# Patient Record
Sex: Female | Born: 1945 | Race: White | Hispanic: No | Smoking: Never smoker
Health system: Southern US, Community
[De-identification: ages and names within clinical notes are randomized; demographics above are authoritative.]

## PROBLEM LIST (undated history)

## (undated) DIAGNOSIS — I480 Paroxysmal atrial fibrillation: Secondary | ICD-10-CM

## (undated) DIAGNOSIS — D649 Anemia, unspecified: Secondary | ICD-10-CM

## (undated) DIAGNOSIS — Z9889 Other specified postprocedural states: Secondary | ICD-10-CM

## (undated) DIAGNOSIS — N189 Chronic kidney disease, unspecified: Secondary | ICD-10-CM

## (undated) DIAGNOSIS — T8859XA Other complications of anesthesia, initial encounter: Secondary | ICD-10-CM

## (undated) DIAGNOSIS — M1990 Unspecified osteoarthritis, unspecified site: Secondary | ICD-10-CM

## (undated) DIAGNOSIS — E119 Type 2 diabetes mellitus without complications: Secondary | ICD-10-CM

## (undated) DIAGNOSIS — I10 Essential (primary) hypertension: Secondary | ICD-10-CM

## (undated) DIAGNOSIS — R112 Nausea with vomiting, unspecified: Secondary | ICD-10-CM

## (undated) DIAGNOSIS — F419 Anxiety disorder, unspecified: Secondary | ICD-10-CM

## (undated) DIAGNOSIS — R001 Bradycardia, unspecified: Principal | ICD-10-CM

## (undated) DIAGNOSIS — Z9289 Personal history of other medical treatment: Secondary | ICD-10-CM

## (undated) DIAGNOSIS — Z87442 Personal history of urinary calculi: Secondary | ICD-10-CM

## (undated) DIAGNOSIS — T4145XA Adverse effect of unspecified anesthetic, initial encounter: Secondary | ICD-10-CM

## (undated) HISTORY — DX: Essential (primary) hypertension: I10

## (undated) HISTORY — DX: Type 2 diabetes mellitus without complications: E11.9

## (undated) HISTORY — DX: Paroxysmal atrial fibrillation: I48.0

## (undated) HISTORY — DX: Personal history of other medical treatment: Z92.89

## (undated) HISTORY — DX: Bradycardia, unspecified: R00.1

## (undated) HISTORY — PX: CARPAL TUNNEL RELEASE: SHX101

---
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
## 1990-07-06 HISTORY — PX: BREAST LUMPECTOMY: SHX2

## 1998-09-18 ENCOUNTER — Other Ambulatory Visit: Admission: RE | Admit: 1998-09-18 | Discharge: 1998-09-18 | Payer: Self-pay | Admitting: *Deleted

## 2000-02-26 ENCOUNTER — Other Ambulatory Visit: Admission: RE | Admit: 2000-02-26 | Discharge: 2000-02-26 | Payer: Self-pay | Admitting: *Deleted

## 2000-08-09 ENCOUNTER — Encounter: Payer: Self-pay | Admitting: Emergency Medicine

## 2000-08-09 ENCOUNTER — Emergency Department (HOSPITAL_COMMUNITY): Admission: EM | Admit: 2000-08-09 | Discharge: 2000-08-09 | Payer: Self-pay | Admitting: *Deleted

## 2000-10-25 ENCOUNTER — Encounter: Admission: RE | Admit: 2000-10-25 | Discharge: 2000-10-25 | Payer: Self-pay | Admitting: Family Medicine

## 2000-10-25 ENCOUNTER — Encounter: Payer: Self-pay | Admitting: Family Medicine

## 2004-03-01 ENCOUNTER — Encounter: Admission: RE | Admit: 2004-03-01 | Discharge: 2004-03-01 | Payer: Self-pay | Admitting: Orthopedic Surgery

## 2004-08-04 ENCOUNTER — Other Ambulatory Visit: Admission: RE | Admit: 2004-08-04 | Discharge: 2004-08-04 | Payer: Self-pay | Admitting: Family Medicine

## 2009-07-06 HISTORY — PX: KNEE ARTHROSCOPY: SHX127

## 2009-07-06 HISTORY — PX: TOTAL KNEE ARTHROPLASTY: SHX125

## 2012-10-04 DIAGNOSIS — Z9289 Personal history of other medical treatment: Secondary | ICD-10-CM

## 2012-10-04 HISTORY — DX: Personal history of other medical treatment: Z92.89

## 2012-10-20 ENCOUNTER — Other Ambulatory Visit: Payer: Self-pay | Admitting: Family Medicine

## 2012-10-20 DIAGNOSIS — R102 Pelvic and perineal pain: Secondary | ICD-10-CM

## 2012-10-21 ENCOUNTER — Ambulatory Visit
Admission: RE | Admit: 2012-10-21 | Discharge: 2012-10-21 | Disposition: A | Discharge status: Home / Self Care | Payer: Medicare Other | Source: Ambulatory Visit | Attending: Family Medicine | Admitting: Family Medicine

## 2012-10-21 DIAGNOSIS — R102 Pelvic and perineal pain: Secondary | ICD-10-CM

## 2013-04-24 ENCOUNTER — Other Ambulatory Visit: Payer: Self-pay | Admitting: *Deleted

## 2013-04-24 DIAGNOSIS — I4891 Unspecified atrial fibrillation: Secondary | ICD-10-CM

## 2013-04-24 DIAGNOSIS — Z79899 Other long term (current) drug therapy: Secondary | ICD-10-CM

## 2013-05-18 ENCOUNTER — Other Ambulatory Visit (INDEPENDENT_AMBULATORY_CARE_PROVIDER_SITE_OTHER): Payer: Medicare Other

## 2013-05-18 DIAGNOSIS — Z79899 Other long term (current) drug therapy: Secondary | ICD-10-CM

## 2013-05-18 DIAGNOSIS — I4891 Unspecified atrial fibrillation: Secondary | ICD-10-CM

## 2013-05-18 LAB — RENAL FUNCTION PANEL
BUN: 19 mg/dL (ref 6–23)
CO2: 27 mEq/L (ref 19–32)
Calcium: 8.2 mg/dL — ABNORMAL LOW (ref 8.4–10.5)
Chloride: 107 mEq/L (ref 96–112)
Creatinine, Ser: 0.8 mg/dL (ref 0.4–1.2)
GFR: 75.07 mL/min (ref >60.00)
Phosphorus: 3.9 mg/dL (ref 2.3–4.6)
Potassium: 3.9 mEq/L (ref 3.5–5.1)
Sodium: 140 mEq/L (ref 135–145)

## 2013-05-18 LAB — HEMATOCRIT: HCT: 33.2 % — ABNORMAL LOW (ref 36.0–46.0)

## 2013-05-18 LAB — HEMOGLOBIN: Hemoglobin: 11.3 g/dL — ABNORMAL LOW (ref 12.0–15.0)

## 2013-05-19 ENCOUNTER — Other Ambulatory Visit: Payer: Medicare Other

## 2013-05-24 ENCOUNTER — Telehealth: Payer: Self-pay | Admitting: Interventional Cardiology

## 2013-05-24 NOTE — Telephone Encounter (Signed)
New Problem:   Pt states she would like a call back in regards to her recent lab work.

## 2013-05-24 NOTE — Telephone Encounter (Signed)
Reviewed results with patient who verbalized understanding.  I faxed results to Dr. Lupita Raider per patient request.  Patient had concerns about hemoglobin and hematocrit being slightly below normal.  I advised patient of s/s of bleeding to watch for and advised her to call PCP if she feels that she needs a urine dipstick and/or a hemoccult to check for bleeding.  Patient verbalized understanding.

## 2013-05-25 ENCOUNTER — Telehealth: Payer: Self-pay | Admitting: Cardiology

## 2013-05-25 DIAGNOSIS — Z79899 Other long term (current) drug therapy: Secondary | ICD-10-CM

## 2013-05-25 NOTE — Telephone Encounter (Signed)
Message copied by Theda Sers on Thu May 25, 2013  7:48 AM ------      Message from: Helix, Gaspar Skeeters      Created: Fri May 19, 2013  4:47 PM       H/H and renal function both stable.  Corrected calcium is actually within normal limits at 8.7.  Continue Xarelto 20 mg qd and recheck CBC and BMET in 6 months.  Please notify patient, and set up lab. Thanks. ------

## 2013-05-25 NOTE — Telephone Encounter (Signed)
Noted  

## 2013-06-15 ENCOUNTER — Telehealth: Payer: Self-pay | Admitting: Interventional Cardiology

## 2013-06-15 NOTE — Telephone Encounter (Signed)
Spoke with pt and she received a letter stating Xarelto will require a PA for 2015. The number to call is [phone]. Have we received any information for pt? Pt is on medication because of aifb for stroke prevention.

## 2013-06-15 NOTE — Telephone Encounter (Signed)
New message  Please call to discuss a letter from [employer] [facility]/// she takes Xarelto and insurance is requesting additional information to discuss the pt taking an alternative.// SR

## 2013-06-16 NOTE — Telephone Encounter (Signed)
[facility] cancelled PA due to not having patient listed as a client of theres yet

## 2013-06-16 NOTE — Telephone Encounter (Signed)
Confirmed with [facility] I  can submit a PA for xarelto for year 2015, I have completed and will fax, received information from Amy to complete form.

## 2013-06-23 ENCOUNTER — Telehealth: Payer: Self-pay | Admitting: Interventional Cardiology

## 2013-06-23 NOTE — Telephone Encounter (Signed)
Follow up    Pt calling back to follow up on her prior auth on her XARELTO.  for [employer]urgent req # [id number]   Pt called a week ago.

## 2013-06-26 NOTE — Telephone Encounter (Signed)
Have informed patient earlier that this PA cannot be done until 07/06/2013 because patient is not listed in their system as an active patient.

## 2013-07-03 ENCOUNTER — Telehealth: Payer: Self-pay | Admitting: Interventional Cardiology

## 2013-07-03 NOTE — Telephone Encounter (Signed)
Follow up    Pt calling follow up on her prior auth./ please give her a call.

## 2013-07-03 NOTE — Telephone Encounter (Signed)
Spoke with pt and let her know that PA can not be done until 07/06/13. See other TE for more info.

## 2013-07-06 HISTORY — PX: TRIGGER FINGER RELEASE: SHX641

## 2013-07-11 ENCOUNTER — Telehealth: Payer: Self-pay | Admitting: *Deleted

## 2013-07-11 NOTE — Telephone Encounter (Signed)
[facility] has already approved the [place], [place] # [id number]

## 2013-09-02 ENCOUNTER — Encounter: Payer: Self-pay | Admitting: *Deleted

## 2013-09-07 ENCOUNTER — Ambulatory Visit: Payer: Medicare Other | Admitting: Interventional Cardiology

## 2013-09-08 ENCOUNTER — Ambulatory Visit: Payer: Medicare Other | Admitting: Interventional Cardiology

## 2013-09-11 ENCOUNTER — Telehealth: Payer: Self-pay | Admitting: Cardiology

## 2013-09-11 ENCOUNTER — Encounter: Payer: Self-pay | Admitting: Interventional Cardiology

## 2013-09-11 ENCOUNTER — Ambulatory Visit (INDEPENDENT_AMBULATORY_CARE_PROVIDER_SITE_OTHER): Payer: Medicare Other | Admitting: Interventional Cardiology

## 2013-09-11 VITALS — BP 141/72 | HR 60 | Ht 63.0 in | Wt 279.0 lb

## 2013-09-11 DIAGNOSIS — E669 Obesity, unspecified: Secondary | ICD-10-CM | POA: Insufficient documentation

## 2013-09-11 DIAGNOSIS — I10 Essential (primary) hypertension: Secondary | ICD-10-CM

## 2013-09-11 DIAGNOSIS — Z7901 Long term (current) use of anticoagulants: Secondary | ICD-10-CM

## 2013-09-11 DIAGNOSIS — I4891 Unspecified atrial fibrillation: Secondary | ICD-10-CM | POA: Insufficient documentation

## 2013-09-11 NOTE — Telephone Encounter (Signed)
It is safe to use voltaren gel with Xarelto as it is a topical.  She cannot take oral formulations of voltaren with Xarelto which may have been the reason it was denied.

## 2013-09-11 NOTE — Telephone Encounter (Signed)
Insurance denied covering pts Voltaren Gell because she is taking Xarelto. Can pt take Volatren Gel with Xarelto?

## 2013-09-11 NOTE — Progress Notes (Signed)
Patient ID: Carrie Newton, female   DOB: 06/22/1946, 68 y.o.   MRN: [id number]    [place], [place],   [address] Phone: [phone] Fax:  [phone]  Date:  09/11/2013   ID:  KELANI ROBART, DOB 1945/09/13, MRN [id number]  PCP:  Mayra Neer, MD      History of Present Illness: Carrie Newton is a 68 y.o. female who had PAF on a monitor. She was started on Xarelto. SHe has had only one episode of palpitations that felt like Afib. It lasted a minute. She has had AFib on the monitor when she did not feel. She had some shortlived palpitations. Atrial Fibrillation F/U:  c/o Chest pain occasional tightness; not related to walking. relieved with deep breathing.  c/o Palpitations.  Denies : Dizziness.  Leg edema.  Orthopnea.  Shortness of breath.  Syncope.     Wt Readings from Last 3 Encounters:  09/11/13 279 lb (126.554 kg)     No past medical history on file.  Current Outpatient Prescriptions  Medication Sig Dispense Refill  . ACCU-CHEK AVIVA PLUS test strip       . atorvastatin (LIPITOR) 20 MG tablet Take 1/2 tab daily      . buPROPion (WELLBUTRIN XL) 300 MG 24 hr tablet Take 1 tab daily      . HYDROcodone-acetaminophen (NORCO) 10-325 MG per tablet Take as directed      . indomethacin (INDOCIN) 25 MG capsule Take as directed      . Insulin Detemir (LEVEMIR FLEXPEN [place]) Inject into the skin. Once daily      . insulin lispro (HUMALOG) 100 UNIT/ML injection Inject into the skin 3 (three) times daily before meals. As directed      . levothyroxine (SYNTHROID, LEVOTHROID) 75 MCG tablet 1 tab daily      . losartan (COZAAR) 25 MG tablet 1 tab daily      . metoprolol tartrate (LOPRESSOR) 25 MG tablet Take 1 tab daily      . nisoldipine (SULAR) 20 MG 24 hr tablet Take 1/2 tab daily for blood pressure      . pantoprazole (PROTONIX) 40 MG tablet Take 1 tab daily      . Rivaroxaban (XARELTO) 20 MG TABS tablet Take 20 mg by mouth daily with supper.      Marland Kitchen VICTOZA 18  MG/3ML SOPN Once daily       No current facility-administered medications for this visit.    Allergies:    Allergies  Allergen Reactions  . Norvasc [Amlodipine Besylate]   . Percodan [Oxycodone-Aspirin]     Social History:  The patient  reports that she has never smoked. She does not have any smokeless tobacco history on file. She reports that she does not drink alcohol or use illicit drugs.   Family History:  The patient's family history is not on file.   ROS:  Please see the history of present illness.  No nausea, vomiting.  No fevers, chills.  No focal weakness.  No dysuria.    All other systems reviewed and negative.   PHYSICAL EXAM: VS:  BP 141/72  Pulse 60  Ht 5\' 3"  (1.6 m)  Wt 279 lb (126.554 kg)  BMI 49.44 kg/m2 Well nourished, well developed, in no acute distress HEENT: normal Neck: no JVD, no carotid bruits Cardiac:  normal S1, S2; RRR;  Lungs:  clear to auscultation bilaterally, no wheezing, rhonchi or rales Abd: soft, nontender, no hepatomegaly Ext: no edema;  small cyst on right wrist Skin: warm and dry Neuro:   no focal abnormalities noted  EKG:  Normal    ASSESSMENT AND PLAN:  Atrial fibrillation  Continue Metoprolol Tartrate Tablet, 25 MG, 1 tablet, Orally, Twice a day prn, 30 day(s), 60, Refills 11 Notes: In NSR now. Xarelto for stroke prevention. Chest discomfort at times, not related to exertion. She will let us know if there is a change in her sx. Feels shortlived palpitations, lasting a few seconds. Try prn metoprolol.  All questions answered. Palpitations have decreased with metoprolol.    2. Hypertension, essential  Continue Nisoldipine Tablet Extended Release 24 Hour, 20 MG, 1/2 tablet, once a day Continue Losartan Potassium Tablet, 25 MG, 1 tablet, Orally, Once a day Notes: Continue to titrate BP meds. Losartan was started for HTN. Controlled today and at home recently.  3. Obesity: She would benefit from weight loss.  Spoke at length about  exercise.  4.  Encounter for anticoagulation: Counseled against oralNSAIDs.  Voltaren gel is one of her meds and she is wondering about the safety.  WIll check with pharmD. Preventive Medicine  Adult topics discussed:  Exercise: 5 days a week, at least 30 minutes of aerobic exercise.         Signed, Mina Marble, MD, [facility] 09/11/2013 2:24 PM

## 2013-09-11 NOTE — Patient Instructions (Addendum)
Your physician recommends that you continue on your current medications as directed. Please refer to the Current Medication list given to you today.  Your physician wants you to follow-up in: 1 year with Dr. Irish Lack. You will receive a reminder letter in the mail two months in advance. If you don't receive a letter, please call our office to schedule the follow-up appointment.   Samples #25 of Xarelto 20 mg given to you today.

## 2013-09-13 ENCOUNTER — Telehealth: Payer: Self-pay | Admitting: Interventional Cardiology

## 2013-09-13 NOTE — Telephone Encounter (Signed)
Pt will cal [facility] and if they need anything from [place] she will let me know.

## 2013-09-13 NOTE — Telephone Encounter (Signed)
Pt called back with the PA line phone # for [facility] [phone]. The MD line is [phone].

## 2013-09-13 NOTE — Telephone Encounter (Signed)
New message  Patient has questions about Voltaren Gel, Please call and advise.

## 2013-09-13 NOTE — Telephone Encounter (Signed)
Pt aware.

## 2013-09-14 NOTE — Telephone Encounter (Signed)
Call [facility] and the prescribing doctor will have to call in and complete PA. Pts orthopedist prescribed volatren gel. Lm for pt to return call.

## 2013-09-14 NOTE — Telephone Encounter (Signed)
Follow Up   Pt returning call from earlier. Requesting a call tomorrow morning 3/13.

## 2013-09-15 NOTE — Telephone Encounter (Signed)
Pt notified. Pt will call Dr. Rosamaria Lints to see if they can do another PA.

## 2013-09-28 ENCOUNTER — Other Ambulatory Visit: Payer: Self-pay | Admitting: Interventional Cardiology

## 2013-10-25 ENCOUNTER — Encounter: Payer: Self-pay | Admitting: Interventional Cardiology

## 2013-11-07 ENCOUNTER — Telehealth: Payer: Self-pay | Admitting: Interventional Cardiology

## 2013-11-07 NOTE — Telephone Encounter (Signed)
New message     Talk to Carrie Newton about 5-13 lab appt.

## 2013-11-07 NOTE — Telephone Encounter (Signed)
Called pt regarding labs.

## 2013-11-09 ENCOUNTER — Telehealth: Payer: Self-pay | Admitting: Pharmacist

## 2013-11-09 DIAGNOSIS — I4891 Unspecified atrial fibrillation: Secondary | ICD-10-CM

## 2013-11-09 NOTE — Telephone Encounter (Signed)
Patient's labs from [place] showed Scr of 0.82.  Patient on Xarelto 20 mg qd.  CrCl is > 100 ml/min given weight and age.  Patient is scheduled to get a CBC in 1 week here.  Patient should continue on XArelto 20 mg qd with largest meal of the day, and make sure to get CBC next week.  Recheck CBC and BMET in 6 months. Please notify patient, and set up labs. Thanks.

## 2013-11-12 IMAGING — US US TRANSVAGINAL NON-OB
1 series · 14 of 25 positions shown · non-contrast
Comparison: None.

CLINICAL DATA: Pelvic pain.

TRANSABDOMINAL AND TRANSVAGINAL ULTRASOUND OF PELVIS
TECHNIQUE: Both transabdominal and transvaginal ultrasound
examinations of the pelvis were performed including evaluation of
the uterus, ovaries, adnexal regions, and pelvic cul-de-sac.

[Series 1: us transvaginal non-ob · 0.35mm/px · 14 of 46 slices shown]
[im 1/46]
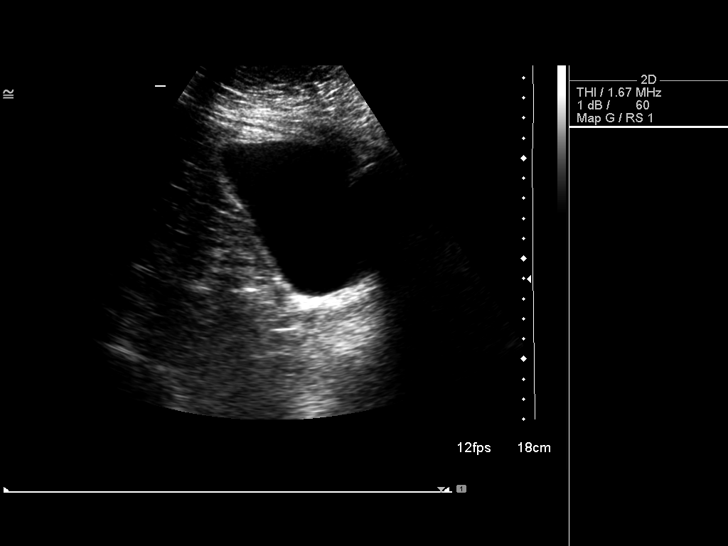
[im 4/46]
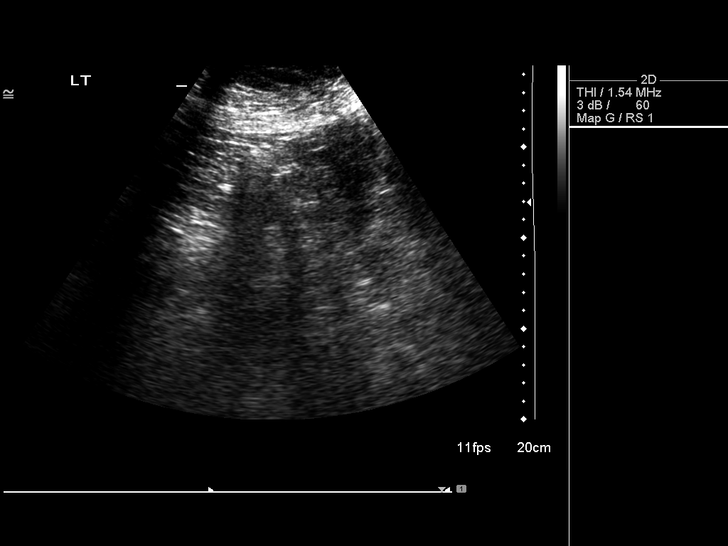
[im 8/46]
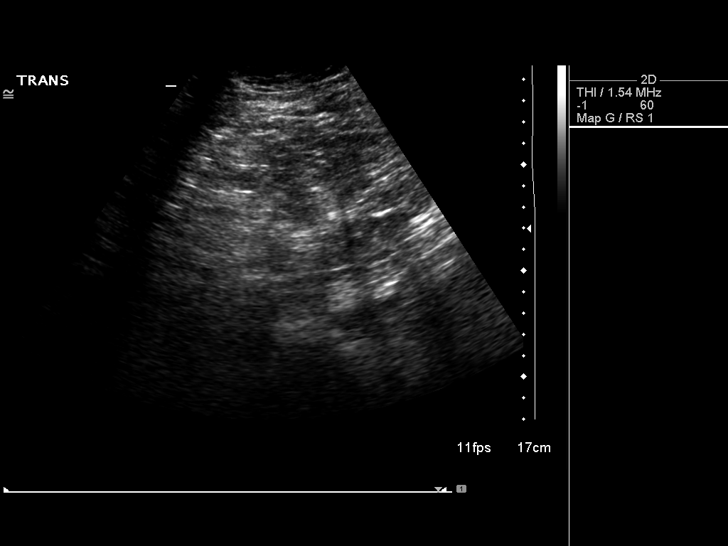
[im 12/46]
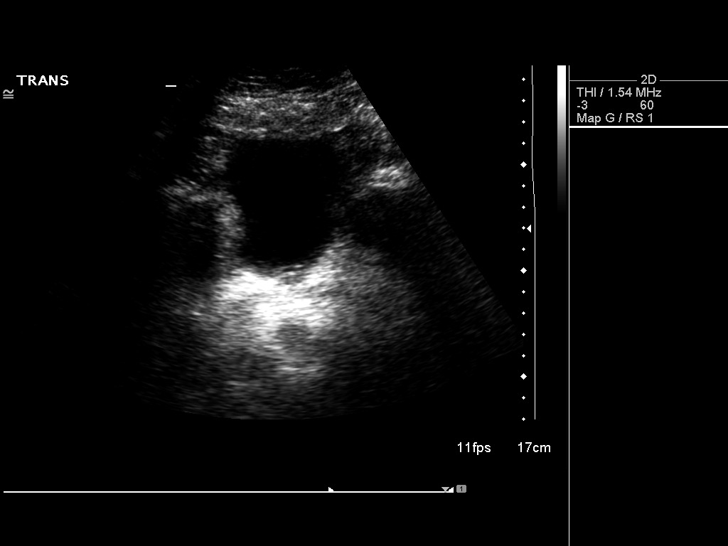
[im 16/46]
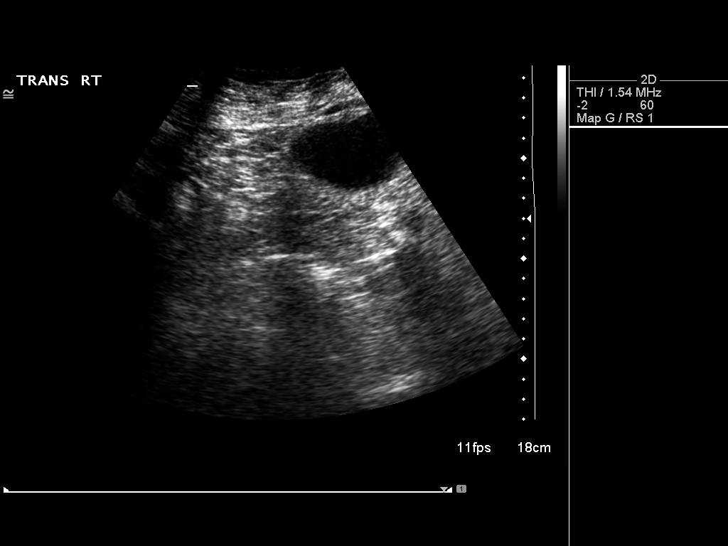
[im 17/46]
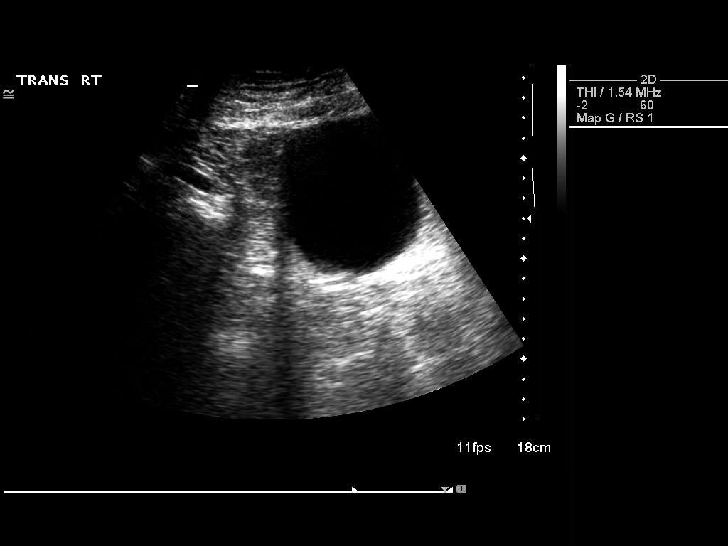
[im 21/46]
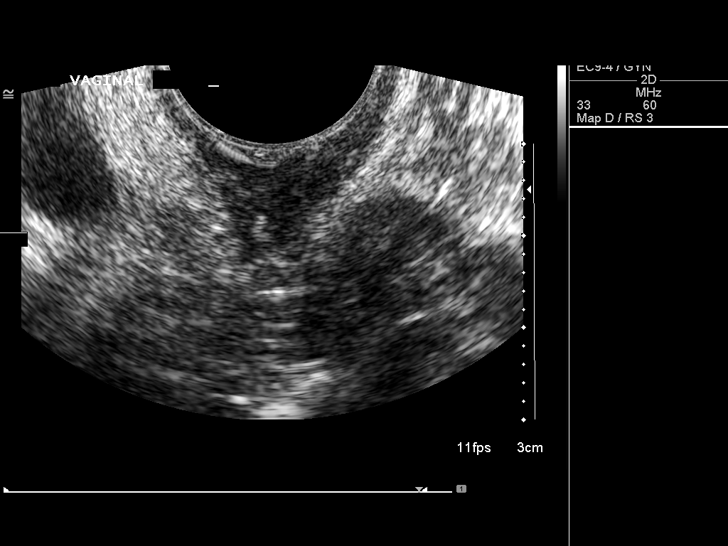
[im 25/46]
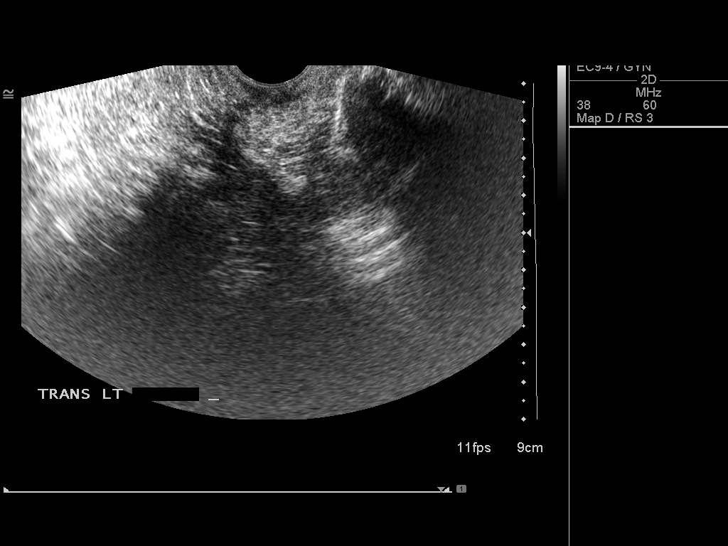
[im 29/46]
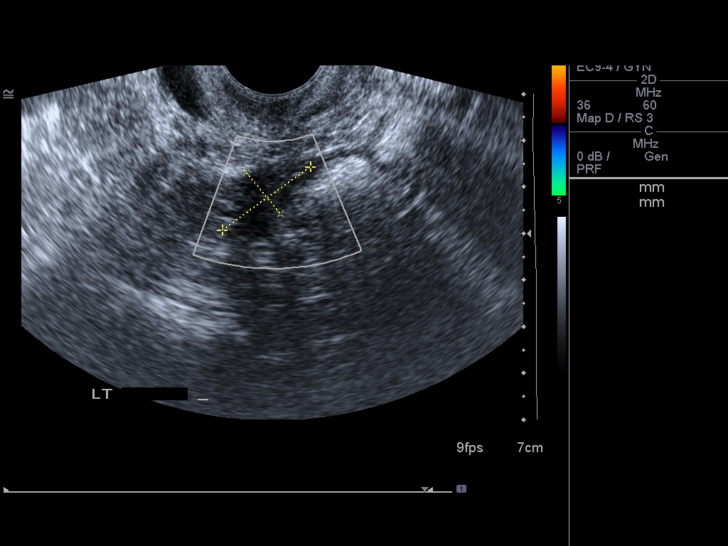
[im 31/46]
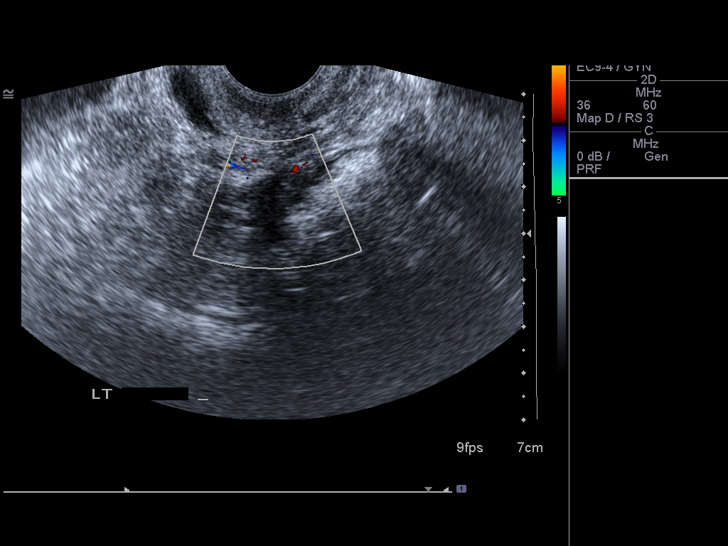
[im 34/46]
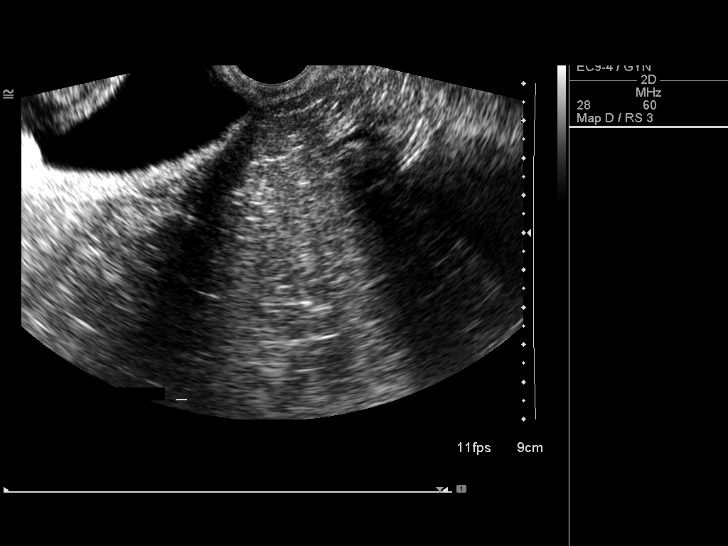
[im 38/46]
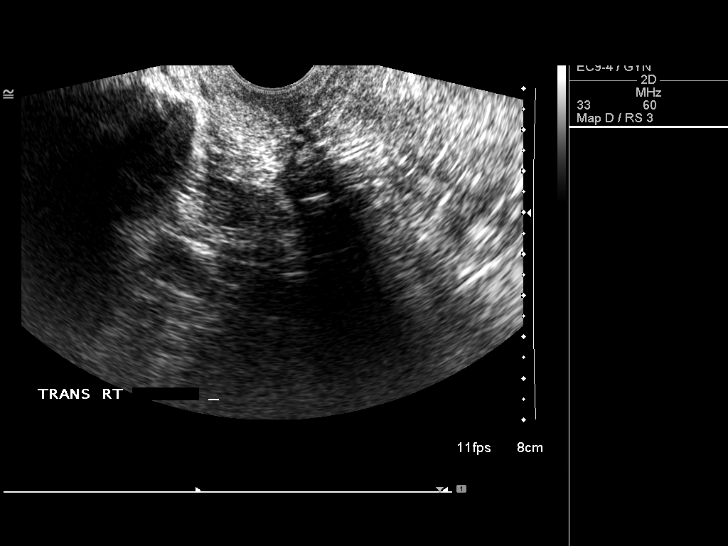
[im 42/46]
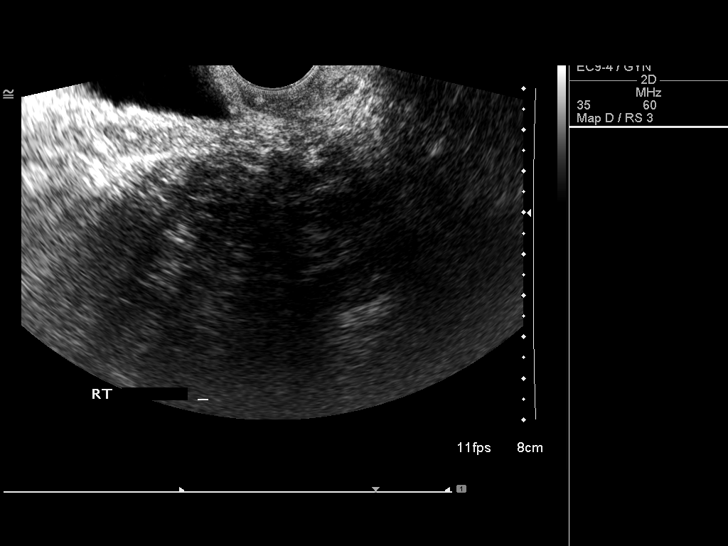
[im 46/46]
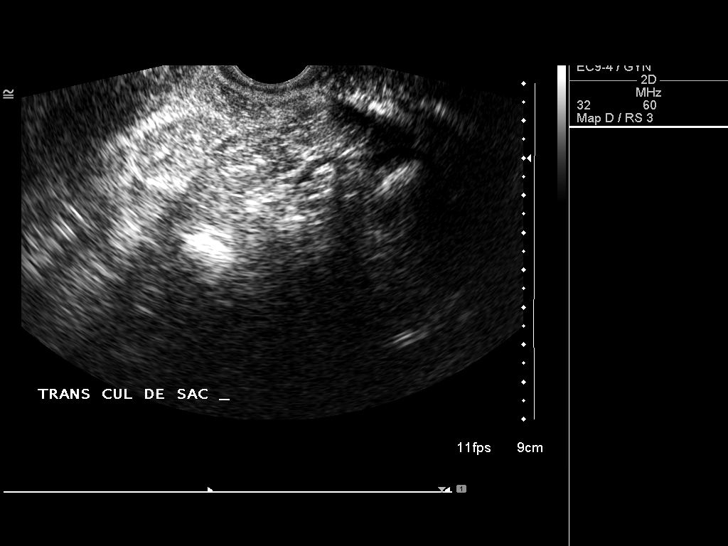

[14 of 25 positions shown; findings below may reference images not displayed]

FINDINGS: Uterus: Prior hysterectomy.

Endometrium: N/A

Right Ovary: Difficult to visualize, likely 2.2 x 1.9 x 2.1 cm.  No
adnexal masses.

Left Ovary: Difficult to visualize, likely 2.3 x 1.2 x 1.9 cm.  No
focal abnormality.  No adnexal masses.

Other Findings:  No free fluid.
IMPRESSION: Unremarkable study.

## 2013-11-14 NOTE — Telephone Encounter (Signed)
Pt notified. Labs ordered.

## 2013-11-15 ENCOUNTER — Encounter: Payer: Self-pay | Admitting: Cardiology

## 2013-11-15 ENCOUNTER — Other Ambulatory Visit (INDEPENDENT_AMBULATORY_CARE_PROVIDER_SITE_OTHER): Payer: Medicare Other

## 2013-11-15 DIAGNOSIS — Z79899 Other long term (current) drug therapy: Secondary | ICD-10-CM

## 2013-11-15 LAB — CBC
HEMATOCRIT: 36.6 % (ref 36.0–46.0)
HEMOGLOBIN: 12.1 g/dL (ref 12.0–15.0)
MCHC: 33.1 g/dL (ref 30.0–36.0)
MCV: 90.7 fl (ref 78.0–100.0)
Platelets: 275 10*3/uL (ref 150.0–400.0)
RBC: 4.03 Mil/uL (ref 3.87–5.11)
RDW: 14.3 % (ref 11.5–15.5)
WBC: 10 10*3/uL (ref 4.0–10.5)

## 2013-12-05 ENCOUNTER — Telehealth: Payer: Self-pay | Admitting: Interventional Cardiology

## 2013-12-05 NOTE — Telephone Encounter (Signed)
New message     Take to Amy.  Have several things to go over

## 2013-12-05 NOTE — Telephone Encounter (Signed)
Pt states in 2 weeks she is going to have colonoscopy and endoscopy by Dr Penelope Coop at [place]. Dr Penelope Coop is asking how many days pt should hold Xarelto prior to procedures.  Pt states a fax has been sent by Dr Penelope Coop  with this information.   Pt also has trigger finger and Dr Amedeo Plenty is doing a procedure for that. Pt states Dr Amedeo Plenty does not feel pt need to hold Xarelto prior to procedure.

## 2013-12-06 NOTE — Telephone Encounter (Signed)
OK to hold Xarelto 2 days prior.

## 2013-12-07 ENCOUNTER — Telehealth: Payer: Self-pay | Admitting: Interventional Cardiology

## 2013-12-07 NOTE — Telephone Encounter (Addendum)
Pt notified. Pt will hold xarelto 2 days prior to endoscopy and colonoscopy, but not for trigger finger.

## 2013-12-07 NOTE — Telephone Encounter (Signed)
Received request from Nurse fax box, documents faxed for surgical clearance. To: [facility] Fax number: [phone] Attention: 6.4.15/km

## 2013-12-07 NOTE — Telephone Encounter (Signed)
Faxed to Dr. Penelope Coop.

## 2014-01-16 ENCOUNTER — Telehealth: Payer: Self-pay | Admitting: Interventional Cardiology

## 2014-01-18 ENCOUNTER — Telehealth: Payer: Self-pay

## 2014-01-18 NOTE — Telephone Encounter (Signed)
Patient came to office to pick up samples placed them up front

## 2014-01-24 ENCOUNTER — Encounter: Payer: Self-pay | Admitting: Interventional Cardiology

## 2014-02-16 NOTE — Telephone Encounter (Signed)
error 

## 2014-03-05 ENCOUNTER — Other Ambulatory Visit: Payer: Self-pay | Admitting: Interventional Cardiology

## 2014-04-27 ENCOUNTER — Encounter: Payer: Self-pay | Admitting: Interventional Cardiology

## 2014-05-08 ENCOUNTER — Telehealth: Payer: Self-pay | Admitting: Interventional Cardiology

## 2014-05-08 NOTE — Telephone Encounter (Deleted)
Error

## 2014-05-16 ENCOUNTER — Other Ambulatory Visit: Payer: Self-pay | Admitting: *Deleted

## 2014-05-16 ENCOUNTER — Telehealth: Payer: Self-pay | Admitting: Interventional Cardiology

## 2014-05-16 DIAGNOSIS — I480 Paroxysmal atrial fibrillation: Secondary | ICD-10-CM

## 2014-05-16 NOTE — Telephone Encounter (Signed)
Pt had BMET done 04/27/14 by Dr Brigitte Pulse at [place] (in [place]).  She is asking that I cancel BMET scheduled for 05/18/14 and she will come for CBCd only. This has been done.

## 2014-05-16 NOTE — Telephone Encounter (Signed)
New message           Pt needs just the CBC blood test on Friday and not the BMET / please give pt a call

## 2014-05-18 ENCOUNTER — Other Ambulatory Visit: Payer: Medicare Other

## 2014-05-18 ENCOUNTER — Other Ambulatory Visit (INDEPENDENT_AMBULATORY_CARE_PROVIDER_SITE_OTHER): Payer: Medicare Other | Admitting: *Deleted

## 2014-05-18 DIAGNOSIS — I480 Paroxysmal atrial fibrillation: Secondary | ICD-10-CM

## 2014-05-18 LAB — CBC WITH DIFFERENTIAL/PLATELET
BASOS ABS: 0 10*3/uL (ref 0.0–0.1)
Basophils Relative: 0.4 % (ref 0.0–3.0)
Eosinophils Absolute: 0.3 10*3/uL (ref 0.0–0.7)
Eosinophils Relative: 3.8 % (ref 0.0–5.0)
HCT: 37.3 % (ref 36.0–46.0)
Hemoglobin: 12.4 g/dL (ref 12.0–15.0)
LYMPHS ABS: 2.4 10*3/uL (ref 0.7–4.0)
Lymphocytes Relative: 28 % (ref 12.0–46.0)
MCHC: 33.2 g/dL (ref 30.0–36.0)
MCV: 87.7 fl (ref 78.0–100.0)
Monocytes Absolute: 0.5 10*3/uL (ref 0.1–1.0)
Monocytes Relative: 5.9 % (ref 3.0–12.0)
NEUTROS PCT: 61.9 % (ref 43.0–77.0)
Neutro Abs: 5.3 10*3/uL (ref 1.4–7.7)
PLATELETS: 281 10*3/uL (ref 150.0–400.0)
RBC: 4.25 Mil/uL (ref 3.87–5.11)
RDW: 14.9 % (ref 11.5–15.5)
WBC: 8.5 10*3/uL (ref 4.0–10.5)

## 2014-07-12 NOTE — Telephone Encounter (Signed)
Did I open this encounter?

## 2014-09-18 ENCOUNTER — Ambulatory Visit (INDEPENDENT_AMBULATORY_CARE_PROVIDER_SITE_OTHER): Payer: Medicare Other | Admitting: Interventional Cardiology

## 2014-09-18 ENCOUNTER — Encounter: Payer: Self-pay | Admitting: Interventional Cardiology

## 2014-09-18 VITALS — BP 130/82 | HR 58 | Ht 63.0 in | Wt 284.4 lb

## 2014-09-18 DIAGNOSIS — R61 Generalized hyperhidrosis: Secondary | ICD-10-CM

## 2014-09-18 DIAGNOSIS — I480 Paroxysmal atrial fibrillation: Secondary | ICD-10-CM | POA: Diagnosis not present

## 2014-09-18 DIAGNOSIS — IMO0001 Reserved for inherently not codable concepts without codable children: Secondary | ICD-10-CM

## 2014-09-18 DIAGNOSIS — R110 Nausea: Secondary | ICD-10-CM

## 2014-09-18 DIAGNOSIS — E669 Obesity, unspecified: Secondary | ICD-10-CM

## 2014-09-18 DIAGNOSIS — Z7901 Long term (current) use of anticoagulants: Secondary | ICD-10-CM

## 2014-09-18 DIAGNOSIS — I10 Essential (primary) hypertension: Secondary | ICD-10-CM

## 2014-09-18 NOTE — Patient Instructions (Signed)
Your physician recommends that you continue on your current medications as directed. Please refer to the Current Medication list given to you today.  Your physician wants you to follow-up in: 1 year with Dr. Varanasi. You will receive a reminder letter in the mail two months in advance. If you don't receive a letter, please call our office to schedule the follow-up appointment.  

## 2014-09-18 NOTE — Progress Notes (Signed)
Patient ID: Carrie Newton, female   DOB: [birth year], 69 y.o.   MRN: [id number]     Cardiology Office Note   Date:  09/18/2014   ID:  Carrie Newton, DOB 1945-12-05, MRN [id number]  PCP:  Mayra Neer, MD  Cardiologist:   Jettie Booze., MD   PAF    History of Present Illness: Carrie Newton is a 69 y.o. female who presents for f/u of PAF on a monitor in 2014. She was started on Xarelto. SHe has had only one episode of palpitations that felt like Afib. It lasted a minute. She has had AFib on the monitor which she did not feel. She had some shortlived palpitations. Atrial Fibrillation F/U:  No further  Chest pain/ tightness;   c/o Palpitations.  Denies : Dizziness.  Leg edema.  Orthopnea.  Shortness of breath.  Syncope.   Does report sweating excessively and occasional nausea.  Improved with Dramamine.  Past Medical History  Diagnosis Date  . Diabetes     Type 2  . Hypertension     Past Surgical History  Procedure Laterality Date  . 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 Breast lumpectomy  1992    Left breast  . Carpal tunnel release      Left hand 05/2012 and right hand dec 2013  . Trigger finger release  2015    Left thumb     Current Outpatient Prescriptions  Medication Sig Dispense Refill  . ACCU-CHEK AVIVA PLUS test strip     . atorvastatin (LIPITOR) 20 MG tablet Take 1/2 tab daily    . buPROPion (WELLBUTRIN XL) 300 MG 24 hr tablet Take 1 tab daily    . HYDROcodone-acetaminophen (NORCO) 10-325 MG per tablet Take 2 tablets by mouth daily. Take as directed    . Insulin Detemir (LEVEMIR FLEXPEN [place]) Inject into the skin at bedtime. Once daily    . insulin lispro (HUMALOG) 100 UNIT/ML injection Inject into the skin 3 (three) times daily before meals. As directed    . levothyroxine (SYNTHROID, LEVOTHROID) 75 MCG tablet 1 tab daily    . losartan (COZAAR) 25 MG tablet 1 tab daily    . metoprolol tartrate (LOPRESSOR) 25 MG tablet TAKE 1 TABLET BY MOUTH  TWICE DAILY AS NEEDED 60 tablet 3  . Multiple Vitamin (MULTIVITAMIN) tablet Take 1 tablet by mouth daily.    . nisoldipine (SULAR) 20 MG 24 hr tablet Take 1/2 tab daily for blood pressure    . pantoprazole (PROTONIX) 40 MG tablet Take 1 tab daily    . VICTOZA 18 MG/3ML SOPN Once daily    . XARELTO 20 MG TABS tablet Take 1 tablet by mouth  every day 90 tablet 2   No current facility-administered medications for this visit.    Allergies:   Norvasc and Percodan    Social History:  The patient  reports that she has never smoked. She does not have any smokeless tobacco history on file. She reports that she does not drink alcohol or use illicit drugs.   Family History:  The patient's family history includes Heart attack in her father; Hypertension in her father.    ROS:  Please see the history of present illness.   Otherwise, review of systems are positive for sweating, nausea- not together.   All other systems are reviewed and negative.    PHYSICAL EXAM: VS:  BP 130/82 mmHg  Pulse 58  Ht 5\' 3"  (1.6 m)  Wt  284 lb 6.4 oz (129.003 kg)  BMI 50.39 kg/m2 , BMI Body mass index is 50.39 kg/(m^2). GEN: Well nourished, well developed, in no acute distress HEENT: normal Neck: no JVD, carotid bruits, or masses Cardiac: RRR; no murmurs, rubs, or gallops,no edema  Respiratory:  clear to auscultation bilaterally, normal work of breathing GI: soft, nontender, nondistended, + BS, obese MS: no deformity or atrophy Skin: warm and dry, no rash Neuro:  Strength and sensation are intact Psych: euthymic mood, full affect   EKG:  EKG is ordered today. The ekg ordered today demonstrates sinus bradycardia, LVH, no ST segment changes   Recent Labs: 05/18/2014: Hemoglobin 12.4; Platelets 281.0    Lipid Panel No results found for: CHOL, TRIG, HDL, CHOLHDL, VLDL, LDLCALC, LDLDIRECT    Wt Readings from Last 3 Encounters:  09/18/14 284 lb 6.4 oz (129.003 kg)  09/11/13 279 lb (126.554 kg)       Other studies Reviewed: Additional studies/ records that were reviewed today include: none.    ASSESSMENT AND PLAN:  1.   Atrial fibrillation  Continue Metoprolol Tartrate Tablet, 25 MG, 1 tablet, Orally, Twice a day prn, 30 day(s), 60, Refills 11 Notes: In NSR now. Xarelto for stroke prevention.  She will let us know if there is a change in her sx. Feels shortlived palpitations, lasting a few seconds. Try prn metoprolol.  All questions answered. Palpitations have decreased with metoprolol.      2. Hypertension, essential  Continue Nisoldipine Tablet Extended Release 24 Hour, 20 MG, 1/2 tablet, once a day Continue Losartan Potassium Tablet, 25 MG, 1 tablet, Orally, Once a day Notes: Continue to titrate BP meds. Losartan was started for HTN. Controlled today and at home recently.  3. Obesity: She would benefit from weight loss. Spoke at length about exercise and diet control.   4. Encounter for anticoagulation: No recent bleeding issues. Continue Xarelto for stroke prevention.  5. Nausea/sweating: does not sound cardiac.  F/u with Dr. Brigitte Pulse.  Preventive Medicine  Adult topics discussed:  Exercise: 5 days a week, at least 30 minutes of aerobic exercise.             Current medicines are reviewed at length with the patient today.  The patient does not have concerns regarding medicines.  The following changes have been made:  no change  Labs/ tests ordered today include: labs to be done with Dr. Brigitte Pulse.  No orders of the defined types were placed in this encounter.     Disposition:   FU with *me in  1year   Teresita Madura., MD  09/18/2014 1:41 PM    [facility] [place], [place], [place]  [address] Phone: [phone]; Fax: [phone]

## 2014-11-01 ENCOUNTER — Encounter: Payer: Self-pay | Admitting: Interventional Cardiology

## 2014-12-04 ENCOUNTER — Other Ambulatory Visit: Payer: Self-pay

## 2014-12-04 MED ORDER — RIVAROXABAN 20 MG PO TABS: ORAL_TABLET | ORAL | Status: DC

## 2014-12-27 NOTE — Addendum Note (Signed)
Addended by: Freada Bergeron on: 12/27/2014 05:54 PM   Modules accepted: Orders

## 2015-01-21 ENCOUNTER — Telehealth: Payer: Self-pay

## 2015-01-21 NOTE — Telephone Encounter (Signed)
Patient called in requesting 1 month supply of Xalrelto 20 mg samples.

## 2015-02-11 ENCOUNTER — Telehealth: Payer: Self-pay | Admitting: Interventional Cardiology

## 2015-02-11 DIAGNOSIS — I480 Paroxysmal atrial fibrillation: Secondary | ICD-10-CM

## 2015-02-11 NOTE — Telephone Encounter (Signed)
Continue Xarelto. WOuld consider a 30 day continuous tele monitor to get a sense  Of her AFIb burden. THis would help guide therapy.  Can order if AFIb remains at the current level.

## 2015-02-11 NOTE — Telephone Encounter (Signed)
Pt states that she feels like she has been in and out of Afib starting Saturday. Pt states that she took her PRN Metoprolol yesterday and it helped. Pt states that she has had a dull headache on and off for about a week. Pt did not have any vitals available due to BP cuff broken but pt states that she is going to obtain a new one. Pt states that she can feel her heart racing- "feels like 100's of little butterflies in the middle of my chest." Pt states that she has not felt the sensation this morning but says when she gets up and gets to moving it may come on. Informed pt that I would route this information to Dr. Irish Lack for review and advisement.

## 2015-02-11 NOTE — Telephone Encounter (Signed)
New message      Pt has been in AFIB since Saturday----off and on.  Please advise.  She has not checked her heart rate or bp.

## 2015-02-12 NOTE — Telephone Encounter (Signed)
Follow up    Pt calling back regarding conversation you had yesterday Pt wondering if you have heard back from Dr. Irish Lack Please call to discuss

## 2015-02-12 NOTE — Telephone Encounter (Signed)
Spoke with pt and provided her with information from Dr. Irish Lack. Pt would like to proceed with monitor. Order placed and informed pt that someone from our [facility] team would contact her to get this schedule. Pt appreciative for call back.

## 2015-02-19 ENCOUNTER — Ambulatory Visit (INDEPENDENT_AMBULATORY_CARE_PROVIDER_SITE_OTHER): Payer: Medicare Other

## 2015-02-19 DIAGNOSIS — I480 Paroxysmal atrial fibrillation: Secondary | ICD-10-CM | POA: Diagnosis not present

## 2015-03-07 DIAGNOSIS — R001 Bradycardia, unspecified: Secondary | ICD-10-CM

## 2015-03-07 HISTORY — DX: Bradycardia, unspecified: R00.1

## 2015-03-26 ENCOUNTER — Encounter: Payer: Self-pay | Admitting: Physician Assistant

## 2015-03-26 ENCOUNTER — Telehealth: Payer: Self-pay | Admitting: Interventional Cardiology

## 2015-03-26 ENCOUNTER — Ambulatory Visit (INDEPENDENT_AMBULATORY_CARE_PROVIDER_SITE_OTHER): Payer: Medicare Other | Admitting: Physician Assistant

## 2015-03-26 VITALS — BP 126/70 | HR 47 | Ht 63.0 in | Wt 282.0 lb

## 2015-03-26 DIAGNOSIS — I480 Paroxysmal atrial fibrillation: Secondary | ICD-10-CM | POA: Diagnosis not present

## 2015-03-26 DIAGNOSIS — E119 Type 2 diabetes mellitus without complications: Secondary | ICD-10-CM

## 2015-03-26 DIAGNOSIS — R079 Chest pain, unspecified: Secondary | ICD-10-CM | POA: Diagnosis not present

## 2015-03-26 DIAGNOSIS — I10 Essential (primary) hypertension: Secondary | ICD-10-CM | POA: Diagnosis not present

## 2015-03-26 DIAGNOSIS — R001 Bradycardia, unspecified: Secondary | ICD-10-CM | POA: Diagnosis not present

## 2015-03-26 NOTE — Progress Notes (Signed)
Cardiology Office Note   Date:  03/26/2015   ID:  TRU LEOPARD, DOB Apr 07, 1946, MRN [id number]  PCP:  Mayra Neer, MD  Cardiologist:  Dr. Casandra Doffing   Electrophysiologist:  n/a  Chief Complaint  Patient presents with  . Dizziness     History of Present Illness: Carrie Newton is a 69 y.o. female with a hx of PAF, HTN, DM2.  She is on Xarelto for anticoagulation.  She called in recently with increase palpitations.  She wore an event monitor that demonstrated NSR, PACs, short atrial runs but no AF.  She then called in today with low BP, low HR (40s), dizziness/weakness and more shortness of breath. She was added on for evaluation.  She comes in to the office by herself.  She has not felt good for a couple of days. She has had some chest tightness that is constant.  She notes more flutters in her chest. She notes increased dyspnea with minimal activities.  She is probably NYHA 3.  She denies PND. She denies LE edema.  She denies frank syncope. She has difficulty sleeping.     Studies/Reports Reviewed Today:  Echo 4/14 EF 60-65%, mild LAE, MAC, impaired relaxation    Past Medical History  Diagnosis Date  . Diabetes     Type 2  . Hypertension   . PAF (paroxysmal atrial fibrillation)   . 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 Breast lumpectomy  1992    Left breast  . Carpal tunnel release      Left hand 05/2012 and right hand dec 2013  . Trigger finger release  2015    Left thumb     Current Outpatient Prescriptions  Medication Sig Dispense Refill  . ACCU-CHEK AVIVA PLUS test strip     . atorvastatin (LIPITOR) 20 MG tablet Take 1/2 tab daily    . buPROPion (WELLBUTRIN XL) 300 MG 24 hr tablet Take 1 tab daily    . HYDROcodone-acetaminophen (NORCO) 10-325 MG per tablet Take 2 tablets by mouth daily. Take as directed    . Insulin Detemir (LEVEMIR FLEXPEN [place]) Inject into  the skin at bedtime. Once daily    . insulin lispro (HUMALOG) 100 UNIT/ML injection Inject into the skin 3 (three) times daily before meals. As directed    . levothyroxine (SYNTHROID, LEVOTHROID) 75 MCG tablet 1 tab daily    . losartan (COZAAR) 25 MG tablet 1 tab daily    . Multiple Vitamin (MULTIVITAMIN) tablet Take 1 tablet by mouth daily.    . nisoldipine (SULAR) 20 MG 24 hr tablet Take 1/2 tab daily for blood pressure    . pantoprazole (PROTONIX) 40 MG tablet Take 40 mg by mouth every other day.     . rivaroxaban (XARELTO) 20 MG TABS tablet Take 1 tablet by mouth  every day 90 tablet 2  . sertraline (ZOLOFT) 100 MG tablet Take 100 mg by mouth daily.    Marland Kitchen VICTOZA 18 MG/3ML SOPN Once daily     No current facility-administered medications for this visit.    Allergies:   Norvasc; Percodan; and Neosporin    Social History:  The patient  reports that she has never smoked. She does not have any smokeless tobacco history on file. She reports that she does not drink alcohol or use illicit drugs.   Family History:  The patient's family history includes  Heart attack in her father; Hypertension in her father.    ROS:   Please see the history of present illness.   Review of Systems  Constitution: Positive for chills, decreased appetite, diaphoresis and malaise/fatigue.  HENT: Positive for headaches.   Eyes: Positive for visual disturbance.  Cardiovascular: Positive for chest pain and irregular heartbeat.  Respiratory: Positive for cough, shortness of breath and wheezing.   Hematologic/Lymphatic: Bruises/bleeds easily.  Musculoskeletal: Positive for back pain and myalgias.  Gastrointestinal: Positive for diarrhea.  Neurological: Positive for dizziness and loss of balance.  Psychiatric/Behavioral: Positive for depression. The patient is nervous/anxious.       PHYSICAL EXAM: VS:  BP 126/70 mmHg  Pulse 47  Ht 5\' 3"  (1.6 m)  Wt 282 lb (127.914 kg)  BMI 49.97 kg/m2  SpO2 97%    Wt  Readings from Last 3 Encounters:  03/26/15 282 lb (127.914 kg)  09/18/14 284 lb 6.4 oz (129.003 kg)  09/11/13 279 lb (126.554 kg)     GEN: Well nourished, well developed, in no acute distress HEENT: normal Neck: no JVD,  no masses Cardiac:  Normal S1/S2, slow regular rhythm; no murmur ,  no rubs or gallops, no edema   Respiratory:  clear to auscultation bilaterally, no wheezing, rhonchi or rales. GI: soft, nontender, nondistended, + BS MS: no deformity or atrophy Skin: warm and dry  Neuro:  CNs II-XII intact, Strength and sensation are intact Psych: Normal affect   EKG:  EKG is ordered today.  It demonstrates:   Junctional brady, HR 47, retrograde p waves noted in QRS   Recent Labs: 05/18/2014: Hemoglobin 12.4; Platelets 281.0    Lipid Panel No results found for: CHOL, TRIG, HDL, CHOLHDL, VLDL, LDLCALC, LDLDIRECT    ASSESSMENT AND PLAN:  1. Junctional Bradycardia:  She is quite symptomatic with this rhythm.  She is fatigued, dizzy, short of breath and experiencing chest pain. She does not have any worsening chest pain with activity and there is no ST elevation on her ECG. I suspect her symptoms are all related to junctional bradycardia.  I reviewed this with Dr. Cristopher Peru (DOD) today.  She does take Metoprolol Tartrate daily.  I will have her DC the Metoprolol.  Check BMET, TSH.  FU with me in 2-3 days.  If she remains in junctional bradycardia, she will need a PPM.  She knows to go to the ED if she feels worse.    2. PAF:  No evidence of AFib on recent monitor.  If she has AF with RVR in the future, she would likely require pacing as she has now developed junctional brady on AVN blocking agents.  3. Diabetes Mellitus:  Well controlled.  Last A1c < 7.  FU with PCP.  4. HTN:  Controlled.  May need to adjust Rx once she is off of her beta-blocker.  5. Chest Pain:  If this continues in NSR, will need to pursue ischemic workup.      Medication Changes: Current medicines are  reviewed at length with the patient today.  Concerns regarding medicines are as outlined above.  The following changes have been made:   Discontinued Medications   METOPROLOL TARTRATE (LOPRESSOR) 25 MG TABLET    TAKE 1 TABLET BY MOUTH TWICE DAILY AS NEEDED   Modified Medications   No medications on file   New Prescriptions   No medications on file    Labs/ tests ordered today include:   Orders Placed This Encounter  Procedures  .  Basic Metabolic Panel (BMET)  . TSH  . EKG 12-Lead      Disposition:    FU with me Friday 9/23.   Signed, Versie Starks, MHS 03/26/2015 6:11 PM    [facility] [place], [place], [place]  [address] Phone: [phone]; Fax: [phone]

## 2015-03-26 NOTE — Telephone Encounter (Signed)
Spoke with pt and she states that her BP this morning at 0800 was 95/58, HR 40.   Pt states that she has felt lightheaded a couple of times but did not feel like she is going to pass out. Pt states that she is also feeling her heart flutter, chest pressure and occasionally has trouble taking a "good, deep breath." Pt states last night she took her Losartan 25mg  and her Metoprolol Tartrate 25mg  at the same time and her BP at 9:30PM was 97.70, HR 62. Pt states that she did not take her Nisoldipine. Pt recently turned in her event monitor but no results at this time. Spoke with Richardson Dopp, PA- flex today and he said to have pt come in for evaluation. Spoke with pt and scheduled her to see Richardson Dopp, PA-C at 3:30PM today. Pt verbalized understanding and was in agreement with this plan.

## 2015-03-26 NOTE — Telephone Encounter (Signed)
Left message to call back  

## 2015-03-26 NOTE — Patient Instructions (Signed)
Medication Instructions:  1. STOP METOPROLOL  Labwork: TODAY BMET, TSH  Testing/Procedures: NONE  Follow-Up: Friday 03/29/15 WITH SCOTT WEAVER, PAC  Any Other Special Instructions Will Be Listed Below (If Applicable).

## 2015-03-26 NOTE — Telephone Encounter (Signed)
New message      Pt states her pulse is 40 (according to her bp machine) and bp is 95/58.  She states she does not feel good.  Please advise.

## 2015-03-27 LAB — BASIC METABOLIC PANEL
BUN: 19 mg/dL (ref 6–23)
CHLORIDE: 104 meq/L (ref 96–112)
CO2: 27 meq/L (ref 19–32)
CREATININE: 0.98 mg/dL (ref 0.40–1.20)
Calcium: 9 mg/dL (ref 8.4–10.5)
GFR: 59.92 mL/min — ABNORMAL LOW (ref >60.00)
Glucose, Bld: 174 mg/dL — ABNORMAL HIGH (ref 70–99)
POTASSIUM: 4.5 meq/L (ref 3.5–5.1)
Sodium: 138 mEq/L (ref 135–145)

## 2015-03-27 LAB — TSH: TSH: 3.7 u[IU]/mL (ref 0.35–4.50)

## 2015-03-28 ENCOUNTER — Telehealth: Payer: Self-pay | Admitting: Physician Assistant

## 2015-03-28 NOTE — Telephone Encounter (Signed)
New Message       Pt calling Arbie Cookey back for lab results.

## 2015-03-28 NOTE — Telephone Encounter (Signed)
Pt notified of lab results by phonoe with verbal understanding. Pt verified her appt 9/23 8:30 with Brynda Rim. PA.

## 2015-03-28 NOTE — Progress Notes (Signed)
Cardiology Office Note   Date:  03/29/2015   ID:  Carrie Newton, DOB 05-17-1946, MRN [id number]  PCP:  Mayra Neer, MD  Cardiologist:  Dr. Casandra Doffing   Electrophysiologist:  n/a  Chief Complaint  Patient presents with  . Follow-up    Junctional Bradycardia; Chest Pain     History of Present Illness: Carrie Newton is a 69 y.o. female with a hx of PAF, HTN, DM2.  She is on Xarelto for anticoagulation.  She called in recently with increase palpitations.  She wore an event monitor that demonstrated NSR, PACs, short atrial runs but no AF.    I saw her earlier this week with complaints of chest discomfort, dizziness and weakness and increased dyspnea. She was found to be in junctional bradycardia. I stopped her metoprolol. She returns for early follow-up.   Returns for FU.  She is feeling much better.  Denies chest pain.  Breathing is improved. Denies dizziness or edema.  BP has been 333L systolic since I last saw her.     Studies/Reports Reviewed Today:  Echo 4/14 EF 60-65%, mild LAE, MAC, impaired relaxation   Past Medical History  Diagnosis Date  . Diabetes     Type 2  . Hypertension   . PAF (paroxysmal atrial fibrillation)   . 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 History of echocardiogram 10/2012    Echo 4/14:  EF 60-65%, mild LAE, MAC, impaired relaxation    Past Surgical History  Procedure Laterality Date  . 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 Breast lumpectomy  1992    Left breast  . Carpal tunnel release      Left hand 05/2012 and right hand dec 2013  . Trigger finger release  2015    Left thumb     Current Outpatient Prescriptions  Medication Sig Dispense Refill  . ACCU-CHEK AVIVA PLUS test strip     . atorvastatin (LIPITOR) 20 MG tablet Take 1/2 tab daily    . buPROPion (WELLBUTRIN XL) 300 MG 24 hr tablet Take 1 tab daily    . HYDROcodone-acetaminophen (NORCO) 10-325 MG per tablet Take 2 tablets by mouth daily. Take as directed    .  Insulin Detemir (LEVEMIR FLEXPEN [place]) Inject into the skin at bedtime. Once daily    . insulin lispro (HUMALOG) 100 UNIT/ML injection Inject into the skin 3 (three) times daily before meals. As directed    . levothyroxine (SYNTHROID, LEVOTHROID) 75 MCG tablet 1 tab daily    . losartan (COZAAR) 25 MG tablet 1 tab daily    . Multiple Vitamin (MULTIVITAMIN) tablet Take 1 tablet by mouth daily.    . nisoldipine (SULAR) 20 MG 24 hr tablet Take 1/2 tab daily for blood pressure    . pantoprazole (PROTONIX) 40 MG tablet Take 40 mg by mouth every other day.     . rivaroxaban (XARELTO) 20 MG TABS tablet Take 1 tablet by mouth  every day 90 tablet 2  . sertraline (ZOLOFT) 100 MG tablet Take 100 mg by mouth daily.    Marland Kitchen VICTOZA 18 MG/3ML SOPN Once daily     No current facility-administered medications for this visit.    Allergies:   Metoprolol tartrate; Neosporin; Norvasc; and Percodan    Social History:  The patient  reports that she has never smoked. She does not have any smokeless tobacco history on file. She reports that she does not drink alcohol or use illicit drugs.   Family  History:  The patient's family history includes Heart attack in her father; Hypertension in her father.    ROS:   Please see the history of present illness.   Review of Systems  Cardiovascular: Positive for irregular heartbeat.  All other systems reviewed and are negative.     PHYSICAL EXAM: VS:  BP 130/70 mmHg  Pulse 62  Ht 5\' 3"  (1.6 m)  Wt 282 lb 12.8 oz (128.277 kg)  BMI 50.11 kg/m2  SpO2 98%    Wt Readings from Last 3 Encounters:  03/29/15 282 lb 12.8 oz (128.277 kg)  03/26/15 282 lb (127.914 kg)  09/18/14 284 lb 6.4 oz (129.003 kg)     GEN: Well nourished, well developed, in no acute distress HEENT: normal Neck: no JVD,  no masses Cardiac:  Normal S1/S2, RRR; no murmur ,  no rubs or gallops, no edema   Respiratory:  clear to auscultation bilaterally, no wheezing, rhonchi or rales. GI: soft,  nontender, nondistended, + BS MS: no deformity or atrophy Skin: warm and dry  Neuro:  CNs II-XII intact, Strength and sensation are intact Psych: Normal affect   EKG:  EKG is ordered today.  It demonstrates: NSR, HR 62, normal axis, no ST changes   Recent Labs: 05/18/2014: Hemoglobin 12.4; Platelets 281.0 03/26/2015: BUN 19; Creatinine, Ser 0.98; Potassium 4.5; Sodium 138; TSH 3.70    Lipid Panel No results found for: CHOL, TRIG, HDL, CHOLHDL, VLDL, LDLCALC, LDLDIRECT    ASSESSMENT AND PLAN:  1. Junctional Bradycardia:  Resolved.  She is now back in NSR.  She feels much better. Avoid beta-blockers and other AVN blocking agents in the future.   2. PAF:  No evidence of AFib on recent monitor.  Continue Xarelto.  3. Diabetes Mellitus:  Well controlled.  Last A1c < 7.  FU with PCP.  4. HTN:  Controlled.     5. Chest Pain:  Resolved.  Likely all related to junctional rhythm.      Medication Changes: Current medicines are reviewed at length with the patient today.  Concerns regarding medicines are as outlined above.  The following changes have been made:   Discontinued Medications   SERTRALINE (ZOLOFT) 50 MG TABLET    Take 50 mg by mouth daily.   Modified Medications   No medications on file   New Prescriptions   No medications on file   Labs/ tests ordered today include:   Orders Placed This Encounter  Procedures  . EKG 12-Lead      Disposition:    FU Dr. Casandra Doffing 3 mos.     Signed, Versie Starks, MHS 03/29/2015 9:00 AM    [facility] [place], [place], [place]  [address] Phone: [phone]; Fax: [phone]

## 2015-03-29 ENCOUNTER — Ambulatory Visit (INDEPENDENT_AMBULATORY_CARE_PROVIDER_SITE_OTHER): Payer: Medicare Other | Admitting: Physician Assistant

## 2015-03-29 ENCOUNTER — Encounter: Payer: Self-pay | Admitting: Physician Assistant

## 2015-03-29 VITALS — BP 130/70 | HR 62 | Ht 63.0 in | Wt 282.8 lb

## 2015-03-29 DIAGNOSIS — R001 Bradycardia, unspecified: Secondary | ICD-10-CM

## 2015-03-29 DIAGNOSIS — I480 Paroxysmal atrial fibrillation: Secondary | ICD-10-CM | POA: Diagnosis not present

## 2015-03-29 DIAGNOSIS — I10 Essential (primary) hypertension: Secondary | ICD-10-CM

## 2015-03-29 DIAGNOSIS — R079 Chest pain, unspecified: Secondary | ICD-10-CM

## 2015-03-29 NOTE — Patient Instructions (Signed)
Medication Instructions:  Your physician recommends that you continue on your current medications as directed. Please refer to the Current Medication list given to you today.   Labwork: NONE  Testing/Procedures: NONE  Follow-Up: 3 MONTHS WITH DR. VARANASI  Any Other Special Instructions Will Be Listed Below (If Applicable).

## 2015-06-24 NOTE — Progress Notes (Signed)
Cardiology Office Note    Date:  06/25/2015   ID:  Carrie Newton, DOB [birth year], MRN [id number]  PCP:  Mayra Neer, MD  Cardiologist:  Dr. Casandra Doffing   Electrophysiologist:  N/a   Chief Complaint  Patient presents with  . Follow-up  . Atrial Fibrillation    History of Present Illness:  Carrie Newton is a 69 y.o. female with a hx of PAF, HTN, DM2. She is on Xarelto for anticoagulation. Event monitor 8/16 demonstrated NSR, PACs, short atrial runs but no AF.   I saw her in 9/16 with complaints of chest discomfort, dizziness and weakness and increased dyspnea. She was found to be in junctional bradycardia. I stopped her metoprolol.  Several days later, she was back in NSR.    She returns for routine FU.  She is doing well.  Denies chest pain, significant dyspnea, syncope, orthopnea, PND, edema.  She is concerned about bleeding with anticoagulation.  She questions if she can come off of anticoagulation.     Past Medical History  Diagnosis Date  . Diabetes ([place])     Type 2  . Hypertension   . PAF (paroxysmal atrial fibrillation) ([place])   . 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 History of echocardiogram 10/2012    Echo 4/14:  EF 60-65%, mild LAE, MAC, impaired relaxation    Past Surgical History  Procedure Laterality Date  . 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 Breast lumpectomy  1992    Left breast  . Carpal tunnel release      Left hand 05/2012 and right hand dec 2013  . Trigger finger release  2015    Left thumb    Current Outpatient Prescriptions  Medication Sig Dispense Refill  . ACCU-CHEK AVIVA PLUS test strip 1 each by Other route as needed for other (SUGAR CHECK).     Marland Kitchen atorvastatin (LIPITOR) 20 MG tablet Take 1/2 tablet by mouth once  daily    . buPROPion (WELLBUTRIN XL) 300 MG 24 hr tablet Take 300 mg by mouth daily. Take 1 tab daily    . HYDROcodone-acetaminophen (NORCO) 10-325 MG per tablet Take 2 tablets by mouth daily. Take as directed      . Insulin Detemir (LEVEMIR FLEXPEN [place]) Inject 30-45 Units into the skin at bedtime. Once daily    . insulin lispro (HUMALOG) 100 UNIT/ML injection Inject into the skin 3 (three) times daily before meals. As directed    . levothyroxine (SYNTHROID, LEVOTHROID) 75 MCG tablet Take 75 mcg by mouth daily before breakfast. 1 tab daily    . losartan (COZAAR) 25 MG tablet Take 25 mg by mouth daily. 1 tab daily    . Multiple Vitamin (MULTIVITAMIN) tablet Take 1 tablet by mouth daily.    . nisoldipine (SULAR) 20 MG 24 hr tablet Take 20 mg by mouth daily. Take 1/2 tab daily for blood pressure    . pantoprazole (PROTONIX) 40 MG tablet Take 40 mg by mouth every other day.     . rivaroxaban (XARELTO) 20 MG TABS tablet Take 1 tablet by mouth  every day 90 tablet 2  . sertraline (ZOLOFT) 100 MG tablet Take 100 mg by mouth daily.    Marland Kitchen VICTOZA 18 MG/3ML SOPN Take 18 mg by mouth daily. Once daily     No current facility-administered medications for this visit.    Allergies:   Metoprolol tartrate; Neosporin; Norvasc; and Percodan   Social History   Social History  .  Marital Status: Divorced    Spouse Name: N/A  . Number of Children: N/A  . Years of Education: N/A   Social History Main Topics  . Smoking status: Never Smoker   . Smokeless tobacco: None  . Alcohol Use: No  . Drug Use: No  . Sexual Activity: Not Asked   Other Topics Concern  . None   Social History Narrative     Family History:  The patient's family history includes Heart attack in her father; Hypertension in her father.   ROS:   Please see the history of present illness.    Review of Systems  Hematologic/Lymphatic: Bruises/bleeds easily.  Musculoskeletal: Positive for back pain and myalgias.  Neurological: Positive for loss of balance.  All other systems reviewed and are negative.   PHYSICAL EXAM:   VS:  BP 140/78 mmHg  Pulse 64  Ht 5\' 3"  (1.6 m)  Wt 279 lb 6.4 oz (126.735 kg)  BMI 49.51 kg/m2   GEN: Well nourished,  well developed, in no acute distress HEENT: normal Neck: no JVD, no masses Cardiac: Normal S1/S2, RRR; no murmurs, rubs, or gallops, no edema  Respiratory:  clear to auscultation bilaterally; no wheezing, rhonchi or rales GI: soft, nontender, nondistended, + BS MS: no deformity or atrophy Skin: warm and dry, no rash Neuro:   no focal deficits  Psych: Alert and oriented x 3, normal affect  Wt Readings from Last 3 Encounters:  06/25/15 279 lb 6.4 oz (126.735 kg)  03/29/15 282 lb 12.8 oz (128.277 kg)  03/26/15 282 lb (127.914 kg)      Studies/Labs Reviewed:   EKG:  EKG is  ordered today.  The ekg ordered today demonstrates NSR, HR 64, normal axis, nonspecific ST-T wave changes, QTc 404 ms, no change since prior tracing  Recent Labs: 03/26/2015: BUN 19; Creatinine, Ser 0.98; Potassium 4.5; Sodium 138; TSH 3.70   Recent Lipid Panel No results found for: CHOL, TRIG, HDL, CHOLHDL, VLDL, LDLCALC, LDLDIRECT  Additional studies/ records that were reviewed today include:  Echo 4/14 EF 60-65%, mild LAE, MAC, impaired relaxation    ASSESSMENT:    1. Paroxysmal atrial fibrillation (HCC)   2. Essential hypertension, benign   3. Chronic anticoagulation   4. Controlled type 2 diabetes mellitus without complication, with long-term current use of insulin (HCC)     PLAN:  In order of problems listed above:  1. PAF: Maintaining NSR.  No evidence of AFib on recent monitor. Continue Xarelto.  She has a hx of junctional bradycardia with beta-blocker therapy.  AVN blocking agents should be avoided in the future.     2. HTN: BPs from home reviewed.  BP has been optimal.  Continue current Rx.  3. Anticoagulation - She is concerned about taking anticoagulation. We had an extensive conversation regarding the indications, benefits and risks of long term anticoagulation.  Her CHADS2-VASc=4 giving her a 4.8% annual stroke risk.  She is concerned that only one episode of AF has been documented. We  discussed the [place] AF trial and how PAF can be somewhat difficult to document.  We discussed switching Xarelto to Eliquis based upon the AVERROES trial. She is more interested in changing to Eliquis than Coumadin.  She will contact the community clinic to see if they will take a donation of her Xarelto (just got a Rx filled). Of note, we discussed a referral to Dr. Thompson Grayer for consideration of the Watchman device.  She is currently not interested.  I spent >  40 minutes counseling the patient today (face to face time > 50%).    -  She will call us when (if) she is ready to change from Xarelto to Eliquis.  4.  Diabetes -  Good control.  Recent A1c 6.9.  Continue FU with PCP for management.          Medication Adjustments/Labs and Tests Ordered: Current medicines are reviewed at length with the patient today.  Concerns regarding medicines are outlined above.  Medication changes, Labs and Tests ordered today are listed below.  Patient Instructions  Medication Instructions:  None today - see below  Labwork: We will request your recent blood work from your PCP.  Testing/Procedures: None today.  Follow-Up: Dr. Casandra Doffing in September 18, 2015 as planned.  Any Other Special Instructions Will Be Listed Below (If Applicable).  You can call the following places to see if they would take your medication as a donation: [facility] and [facility] [phone])  [facility] on [address] (previously [facility]) at [phone]  **If you decide to change your Xarelto to Eliquis, call us when you are ready and we will send in a prescription.**     Signed, Richardson Dopp, PA-C  06/25/2015 9:44 AM    [facility] [place], [place], [place]  [address] Phone: [phone]; Fax: [phone]

## 2015-06-25 ENCOUNTER — Ambulatory Visit (INDEPENDENT_AMBULATORY_CARE_PROVIDER_SITE_OTHER): Payer: Medicare Other | Admitting: Physician Assistant

## 2015-06-25 ENCOUNTER — Encounter: Payer: Self-pay | Admitting: Physician Assistant

## 2015-06-25 VITALS — BP 140/78 | HR 64 | Ht 63.0 in | Wt 279.4 lb

## 2015-06-25 DIAGNOSIS — I480 Paroxysmal atrial fibrillation: Secondary | ICD-10-CM

## 2015-06-25 DIAGNOSIS — Z7901 Long term (current) use of anticoagulants: Secondary | ICD-10-CM | POA: Diagnosis not present

## 2015-06-25 DIAGNOSIS — Z794 Long term (current) use of insulin: Secondary | ICD-10-CM

## 2015-06-25 DIAGNOSIS — I10 Essential (primary) hypertension: Secondary | ICD-10-CM | POA: Diagnosis not present

## 2015-06-25 DIAGNOSIS — E119 Type 2 diabetes mellitus without complications: Secondary | ICD-10-CM | POA: Diagnosis not present

## 2015-06-25 NOTE — Patient Instructions (Addendum)
Medication Instructions:  None today - see below  Labwork: We will request your recent blood work from your PCP.  Testing/Procedures: None today.  Follow-Up: Dr. Casandra Doffing in September 18, 2015 as planned.  Any Other Special Instructions Will Be Listed Below (If Applicable).  You can call the following places to see if they would take your medication as a donation: [facility] and [facility] [phone])  [facility] on [address] (previously [facility]) at [phone]  **If you decide to change your Xarelto to Eliquis, call us when you are ready and we will send in a prescription.**

## 2015-07-04 ENCOUNTER — Telehealth: Payer: Self-pay | Admitting: Physician Assistant

## 2015-07-04 NOTE — Telephone Encounter (Signed)
I returned pt's call from earlier today. Pt informs me that she has been having nose bleeds for the last 1-2 weeks. She stated the worst one was last night 12/28 that lasted 4 hours. Pt advised per Dr. Burt Knack (DOD) to hold Xarelto tonight, see Dr, Angelena Form 12/30 (DOD). Pt advised if bleeding begins again tonight then she is to go to the ED. Pt agreeable. I apologized to pt for just getting back to her, though the message sent to me did not say anything about nose bleeds. Pt said she did not tell the operator. I advised pt tell the operator what you need to s/w nurse about so that we can help you better. Pt agreeable to plan of care. She thanked [place] for all of our help.

## 2015-07-04 NOTE — Telephone Encounter (Signed)
New Message  Pt requested to speak w/ Rn to follow up from last ov. Please call back and discuss.

## 2015-07-04 NOTE — Telephone Encounter (Signed)
Would refer to ENT as well to see if any other options available.  Is she taking any NSAIDsor aspirin?

## 2015-07-05 ENCOUNTER — Ambulatory Visit (INDEPENDENT_AMBULATORY_CARE_PROVIDER_SITE_OTHER): Payer: Medicare Other | Admitting: Cardiovascular Disease

## 2015-07-05 ENCOUNTER — Encounter: Payer: Self-pay | Admitting: Cardiovascular Disease

## 2015-07-05 VITALS — BP 110/70 | HR 68 | Ht 63.0 in | Wt 279.0 lb

## 2015-07-05 DIAGNOSIS — I480 Paroxysmal atrial fibrillation: Secondary | ICD-10-CM | POA: Diagnosis not present

## 2015-07-05 DIAGNOSIS — R040 Epistaxis: Secondary | ICD-10-CM | POA: Diagnosis not present

## 2015-07-05 NOTE — Telephone Encounter (Signed)
See phone note from Dr. Irish Lack

## 2015-07-05 NOTE — Progress Notes (Signed)
Chief Complaint  Patient presents with  . Atrial Fibrillation    pt c/o sob with exertion, and swelling in left ankle  . Follow-up   History of Present Illness: 69 yo female with history of PAF, HTN, DM who is followed by Dr. Irish Lack and is added onto my schedule today to discuss recent epistaxis. She is on Xarelto for anti-coagulation. Event monitor August 2016 with NSR, PACs, short atrial runs but no AF. She called our office yesterday and reported that her nose was bleeding for 4 hours. She tells me today that she has been having episodes of epistaxis for 2-3 weeks, lasting 30 minutes. On 06/03/15 she had an episode before bed and this lasted for 4 hours. She held her Xarelto yesterday.   Primary Care Physician: Mayra Neer, MD   Past Medical History  Diagnosis Date  . Diabetes ([place])     Type 2  . Hypertension   . PAF (paroxysmal atrial fibrillation) ([place])   . 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 History of echocardiogram 10/2012    Echo 4/14:  EF 60-65%, mild LAE, MAC, impaired relaxation    Past Surgical History  Procedure Laterality Date  . 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 Breast lumpectomy  1992    Left breast  . Carpal tunnel release      Left hand 05/2012 and right hand dec 2013  . Trigger finger release  2015    Left thumb    Current Outpatient Prescriptions  Medication Sig Dispense Refill  . ACCU-CHEK AVIVA PLUS test strip 1 each by Other route as needed for other (SUGAR CHECK).     Marland Kitchen atorvastatin (LIPITOR) 20 MG tablet Take 1/2 tablet by mouth once  daily    . buPROPion (WELLBUTRIN XL) 300 MG 24 hr tablet Take 300 mg by mouth daily. Take 1 tab daily    . HYDROcodone-acetaminophen (NORCO) 10-325 MG per tablet Take 2 tablets by mouth daily. Take as directed    . Insulin Detemir (LEVEMIR FLEXPEN [place]) Inject 30-45 Units into the skin every morning. Once daily    . insulin lispro (HUMALOG) 100 UNIT/ML injection Inject into the skin 3 (three) times  daily before meals. As directed    . levothyroxine (SYNTHROID, LEVOTHROID) 75 MCG tablet Take 75 mcg by mouth daily before breakfast. 1 tab daily    . losartan (COZAAR) 25 MG tablet Take 25 mg by mouth daily. 1 tab daily    . Multiple Vitamin (MULTIVITAMIN) tablet Take 1 tablet by mouth daily.    . nisoldipine (SULAR) 20 MG 24 hr tablet Take 20 mg by mouth daily. Take 1/2 tab daily for blood pressure    . pantoprazole (PROTONIX) 40 MG tablet Take 40 mg by mouth every other day.     . rivaroxaban (XARELTO) 20 MG TABS tablet Take 1 tablet by mouth  every day 90 tablet 2  . sertraline (ZOLOFT) 100 MG tablet Take 100 mg by mouth daily.    Marland Kitchen VICTOZA 18 MG/3ML SOPN Take 18 mLs by mouth daily. 1.2-1.8 injection  Once daily     No current facility-administered medications for this visit.    Allergies  Allergen Reactions  . Metoprolol Tartrate Other (See Comments)    Junctional Bradycardia   . Neosporin [Neomycin-Bacitracin Zn-Polymyx] Rash  . Norvasc [Amlodipine Besylate] Rash  . Percodan [Oxycodone-Aspirin] Other (See Comments)    Tingling     Social History   Social History  . Marital Status:  Divorced    Spouse Name: N/A  . Number of Children: N/A  . Years of Education: N/A   Occupational History  . Not on file.   Social History Main Topics  . Smoking status: Never Smoker   . Smokeless tobacco: Not on file  . Alcohol Use: No  . Drug Use: No  . Sexual Activity: Not on file   Other Topics Concern  . Not on file   Social History Narrative    Family History  Problem Relation Age of Onset  . Hypertension Father   . Heart attack Father     Review of Systems:  As stated in the HPI and otherwise negative.   BP 110/70 mmHg  Pulse 68  Ht 5\' 3"  (1.6 m)  Wt 279 lb (126.554 kg)  BMI 49.44 kg/m2  SpO2 98%  Physical Examination: General: Well developed, well nourished, NAD HEENT: OP clear, mucus membranes moist SKIN: warm, dry. No rashes. Neuro: No focal  deficits Musculoskeletal: Muscle strength 5/5 all ext Psychiatric: Mood and affect normal Neck: No JVD, no carotid bruits, no thyromegaly, no lymphadenopathy. Lungs:Clear bilaterally, no wheezes, rhonci, crackles Cardiovascular: Regular rate and rhythm. No murmurs, gallops or rubs. Abdomen:Soft. Bowel sounds present. Non-tender.  Extremities: No lower extremity edema. Pulses are 2 + in the bilateral DP/PT.  EKG:  EKG is not ordered today. The ekg ordered today demonstrates   Recent Labs: 03/26/2015: BUN 19; Creatinine, Ser 0.98; Potassium 4.5; Sodium 138; TSH 3.70   Lipid Panel No results found for: CHOL, TRIG, HDL, CHOLHDL, VLDL, LDLCALC, LDLDIRECT   Wt Readings from Last 3 Encounters:  07/05/15 279 lb (126.554 kg)  06/25/15 279 lb 6.4 oz (126.735 kg)  03/29/15 282 lb 12.8 oz (128.277 kg)     Other studies Reviewed: Additional studies/ records that were reviewed today include: . Review of the above records demonstrates:   Assessment and Plan:   1. Atrial fibrillation, paroxysmal/Epistaxis: She is in sinus today. Event monitor in August with no recurrence of atrial fibrillation. She is on Xarelto but now having epistaxis. We have held her Xarelto. I will continue to hold for now. Will refer to ENT. Will go to ED if she has recurrent epistaxis that she can't control. May consider for Watchman (left atrial appendage closure device)  if we can't control her epistaxis. Return in 2 weeks to see Dr. Irish Lack.  Current medicines are reviewed at length with the patient today.  The patient does not have concerns regarding medicines.  The following changes have been made:  no change  Labs/ tests ordered today include:   Orders Placed This Encounter  Procedures  . Ambulatory referral to ENT    Disposition:   FU with Dr. Irish Lack in 2 weeks  Signed, Lauree Chandler, MD 07/05/2015 1:35 PM    [address], [place], [place]  [address] Phone:  [phone]; Fax: [phone]

## 2015-07-05 NOTE — Patient Instructions (Signed)
Medication Instructions:    Your physician has recommended you make the following change in your medication: Continue to hold Xarelto .    Labwork: none  Testing/Procedures: You have been referred to [facility], Nose and Throat   Follow-Up: Your physician recommends that you schedule a follow-up appointment in: 2-3 weeks with Dr. Irish Lack   Any Other Special Instructions Will Be Listed Below (If Applicable).     If you need a refill on your cardiac medications before your next appointment, please call your pharmacy.

## 2015-07-08 ENCOUNTER — Other Ambulatory Visit: Payer: Self-pay | Admitting: Interventional Cardiology

## 2015-07-15 ENCOUNTER — Ambulatory Visit: Payer: Medicare Other | Admitting: Interventional Cardiology

## 2015-07-22 ENCOUNTER — Telehealth: Payer: Self-pay | Admitting: Interventional Cardiology

## 2015-07-22 NOTE — Telephone Encounter (Signed)
Pt was taken off Xarelto for 2-3 weeks due to nose bleed, can she stay off until she see's Dr. Irish Lack on  07-31-15 ?? Pls advise [phone]- ok to leave message

## 2015-07-22 NOTE — Telephone Encounter (Signed)
**Note De-Identified Carrie Newton Obfuscation** Per Truitt Merle, NP the pt is advised to continue to hold Xarelto until her f/u with Dr Irish Lack on 07/31/15. She verbalized understanding.  Will forward message to Dr Irish Lack as [name].

## 2015-07-31 ENCOUNTER — Ambulatory Visit (INDEPENDENT_AMBULATORY_CARE_PROVIDER_SITE_OTHER): Payer: Medicare Other | Admitting: Interventional Cardiology

## 2015-07-31 ENCOUNTER — Encounter: Payer: Self-pay | Admitting: Interventional Cardiology

## 2015-07-31 VITALS — BP 130/80 | HR 80 | Ht 63.0 in | Wt 280.8 lb

## 2015-07-31 DIAGNOSIS — E785 Hyperlipidemia, unspecified: Secondary | ICD-10-CM | POA: Insufficient documentation

## 2015-07-31 DIAGNOSIS — Z794 Long term (current) use of insulin: Secondary | ICD-10-CM

## 2015-07-31 DIAGNOSIS — I10 Essential (primary) hypertension: Secondary | ICD-10-CM | POA: Diagnosis not present

## 2015-07-31 DIAGNOSIS — E119 Type 2 diabetes mellitus without complications: Secondary | ICD-10-CM

## 2015-07-31 DIAGNOSIS — Z7901 Long term (current) use of anticoagulants: Secondary | ICD-10-CM | POA: Diagnosis not present

## 2015-07-31 DIAGNOSIS — I480 Paroxysmal atrial fibrillation: Secondary | ICD-10-CM

## 2015-07-31 DIAGNOSIS — R040 Epistaxis: Secondary | ICD-10-CM

## 2015-07-31 NOTE — Progress Notes (Signed)
Patient ID: Carrie Newton, female   DOB: Sep 29, 1945, 70 y.o.   MRN: [id number]     Cardiology Office Note   Date:  07/31/2015   ID:  Carrie Newton, DOB Aug 29, 1945, MRN [id number]  PCP:  Mayra Neer, MD    No chief complaint on file. AFib, nose bleeding   Wt Readings from Last 3 Encounters:  07/31/15 280 lb 12.8 oz (127.37 kg)  07/05/15 279 lb (126.554 kg)  06/25/15 279 lb 6.4 oz (126.735 kg)       History of Present Illness: Carrie Newton is a 70 y.o. female  Who has had PAF diagnosed several years ago.  She has a normal EF with LA dimension of 4.7 cm.  She was treated with Xarelto for stroke prevention.  In November and December 2016, she experienced episodic nose bleeding. She was seen in the office centers around to stop. She was sent to ENT. There was a scab found on the bleeding area in her nose. She is to follow-up with them.  I reviewed the note from ENT. If anticoagulation was thought to be necessary in the future, they would consider procedure to try and cauterize the bleeding area.    Past Medical History  Diagnosis Date  . Diabetes ([place])     Type 2  . Hypertension   . PAF (paroxysmal atrial fibrillation) ([place])   . 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 History of echocardiogram 10/2012    Echo 4/14:  EF 60-65%, mild LAE, MAC, impaired relaxation    Past Surgical History  Procedure Laterality Date  . 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 Breast lumpectomy  1992    Left breast  . Carpal tunnel release      Left hand 05/2012 and right hand dec 2013  . Trigger finger release  2015    Left thumb     Current Outpatient Prescriptions  Medication Sig Dispense Refill  . ACCU-CHEK AVIVA PLUS test strip 1 each by Other route as needed for other (SUGAR CHECK).     Marland Kitchen atorvastatin (LIPITOR) 20 MG tablet Take 1/2 tablet by mouth once  daily    . buPROPion (WELLBUTRIN XL) 300 MG 24 hr tablet Take 300 mg by mouth daily. Take 1 tab daily    .  HYDROcodone-acetaminophen (NORCO) 10-325 MG per tablet Take 2 tablets by mouth daily. Take as directed    . Insulin Detemir (LEVEMIR FLEXPEN [place]) Inject 30-45 Units into the skin every morning. Once daily    . insulin lispro (HUMALOG) 100 UNIT/ML injection Inject into the skin 3 (three) times daily before meals. As directed    . levothyroxine (SYNTHROID, LEVOTHROID) 75 MCG tablet Take 75 mcg by mouth daily before breakfast. 1 tab daily    . losartan (COZAAR) 25 MG tablet Take 25 mg by mouth daily. 1 tab daily    . Multiple Vitamin (MULTIVITAMIN) tablet Take 1 tablet by mouth daily.    . mupirocin ointment (BACTROBAN) 2 % Apply 1 application topically continuous as needed (CUTS).     . nisoldipine (SULAR) 20 MG 24 hr tablet Take 20 mg by mouth daily. Take 1/2 tab daily for blood pressure    . pantoprazole (PROTONIX) 40 MG tablet Take 40 mg by mouth every other day.     . sertraline (ZOLOFT) 100 MG tablet Take 100 mg by mouth daily.    Marland Kitchen VICTOZA 18 MG/3ML SOPN Take 18 mLs by mouth daily. 1.2-1.8 injection  Once  daily    . XARELTO 20 MG TABS tablet Take 1 tablet by mouth  every day (Patient not taking: Reported on 07/31/2015) 90 tablet 3   No current facility-administered medications for this visit.    Allergies:   Metoprolol tartrate; Neosporin; Norvasc; and Percodan    Social History:  The patient  reports that she has never smoked. She does not have any smokeless tobacco history on file. She reports that she does not drink alcohol or use illicit drugs.   Family History:  The patient's family history includes Heart attack in her father; Hypertension in her father. There is no history of Stroke.    ROS:  Please see the history of present illness.   Otherwise, review of systems are positive for nose bleeding.   All other systems are reviewed and negative.    PHYSICAL EXAM: VS:  BP 130/80 mmHg  Pulse 80  Ht 5\' 3"  (1.6 m)  Wt 280 lb 12.8 oz (127.37 kg)  BMI 49.75 kg/m2 , BMI Body mass index  is 49.75 kg/(m^2). GEN: Well nourished, well developed, in no acute distress HEENT: normal Neck: no JVD, carotid bruits, or masses Cardiac: RRR; no murmurs, rubs, or gallops,no edema  Respiratory:  clear to auscultation bilaterally, normal work of breathing GI: soft, nontender, nondistended, + BS MS: no deformity or atrophy Skin: warm and dry, no rash Neuro:  Strength and sensation are intact Psych: euthymic mood, full affect   EKG:   The ekg ordered today demonstrates    Recent Labs: 03/26/2015: BUN 19; Creatinine, Ser 0.98; Potassium 4.5; Sodium 138; TSH 3.70   Lipid Panel No results found for: CHOL, TRIG, HDL, CHOLHDL, VLDL, LDLCALC, LDLDIRECT   Other studies Reviewed: Additional studies/ records that were reviewed today with results demonstrating: ENT note reviewed.   ASSESSMENT AND PLAN:  AFib: PAF.  This patients CHA2DS2-VASc Score and unadjusted Ischemic Stroke Rate (% per year) is equal to 4.8 % stroke rate/year from a score of 4  Above score calculated as 1 point each if present [CHF, HTN, DM, Vascular=MI/PAD/Aortic Plaque, Age if 65-74, or Female] Above score calculated as 2 points each if present [Age > 75, or Stroke/TIA/TE]  She would be a candidate for anticoagulation.     Epistaxis: Resolved off of Xarelto.   She will enquire about the cost of Eliquis.  It will likely be the same as Xarelto.   We spoke about the risk of stroke being off of the anticoagulation.  The patient does not want to take the chance off of anticoagulation long-term.  She will follow up with ENT. The plan will be for long-term anticoagulation, probably with Eliquis. We'll see if there is a procedure that can be performed at will reduce her bleeding risk.  Hypertension: Blood pressure control. Try to keep the blood pressure as low as possible to minimize nose bleeding.  Hyperlipidemia: Continue statin.  Diabetes: Managed by primary care.   Current medicines are reviewed at length with  the patient today.  The patient concerns regarding her medicines were addressed.  The following changes have been made:  No change  Labs/ tests ordered today include:  No orders of the defined types were placed in this encounter.    Recommend 150 minutes/week of aerobic exercise Low fat, low carb, high fiber diet recommended  Disposition:   FU as scheduled   Teresita Madura., MD  07/31/2015 1:59 PM    [place], [place]  [address] Phone: [phone]; Fax: [phone]

## 2015-07-31 NOTE — Patient Instructions (Signed)
Medication Instructions:  None  Labwork: None  Testing/Procedures: None  Follow-Up: To Be Determined once we receive records from ENT.  Please have them fax notes to [phone].  We will make final decisions on Eliquis or Xarelto once we receive information from ENT.   Any Other Special Instructions Will Be Listed Below (If Applicable).     If you need a refill on your cardiac medications before your next appointment, please call your pharmacy.

## 2015-09-18 ENCOUNTER — Ambulatory Visit: Payer: Medicare Other | Admitting: Interventional Cardiology

## 2015-10-03 ENCOUNTER — Encounter: Payer: Self-pay | Admitting: Interventional Cardiology

## 2015-10-03 ENCOUNTER — Ambulatory Visit (INDEPENDENT_AMBULATORY_CARE_PROVIDER_SITE_OTHER): Payer: Medicare Other | Admitting: Interventional Cardiology

## 2015-10-03 VITALS — BP 154/86 | HR 70 | Ht 63.0 in | Wt 285.4 lb

## 2015-10-03 DIAGNOSIS — Z794 Long term (current) use of insulin: Secondary | ICD-10-CM

## 2015-10-03 DIAGNOSIS — I10 Essential (primary) hypertension: Secondary | ICD-10-CM

## 2015-10-03 DIAGNOSIS — I480 Paroxysmal atrial fibrillation: Secondary | ICD-10-CM

## 2015-10-03 DIAGNOSIS — Z7901 Long term (current) use of anticoagulants: Secondary | ICD-10-CM | POA: Diagnosis not present

## 2015-10-03 DIAGNOSIS — E119 Type 2 diabetes mellitus without complications: Secondary | ICD-10-CM

## 2015-10-03 DIAGNOSIS — E785 Hyperlipidemia, unspecified: Secondary | ICD-10-CM

## 2015-10-03 NOTE — Progress Notes (Signed)
Patient ID: Carrie Newton, female   DOB: Sep 22, 1945, 70 y.o.   MRN: [id number]     Cardiology Office Note   Date:  10/03/2015   ID:  Carrie Newton, DOB 07/05/1946, MRN [id number]  PCP:  Carrie Neer, MD    No chief complaint on file. AFib, nose bleeding   Wt Readings from Last 3 Encounters:  10/03/15 285 lb 6.4 oz (129.457 kg)  07/31/15 280 lb 12.8 oz (127.37 kg)  07/05/15 279 lb (126.554 kg)       History of Present Illness: Carrie Newton is a 70 y.o. female  Who has had PAF diagnosed several years ago.  She has a normal EF with LA dimension of 4.7 cm.  She was treated with Xarelto for stroke prevention.  In November and December 2016, she experienced episodic nose bleeding. She was seen in the office centers around to stop. She was sent to ENT. There was a scab found on the bleeding area in her nose. She is to follow-up with them.  I reviewed the note from ENT. If anticoagulation was thought to be necessary in the future, they would consider procedure to try and cauterize the bleeding area.  She saw ENT 2/14 but it was felt that no cauterization procedure was necessary.  No nosebleeds since that time.  She has been reducing the frequency of her Xarelto at times.  She has not enquired about Eliquis.  She is using saline nasal spray.  Blood sugar has been better.  Morning lows have resolved after Levemir was switched to morning.    Past Medical History  Diagnosis Date  . Diabetes ([place])     Type 2  . Hypertension   . PAF (paroxysmal atrial fibrillation) ([place])   . 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 History of echocardiogram 10/2012    Echo 4/14:  EF 60-65%, mild LAE, MAC, impaired relaxation    Past Surgical History  Procedure Laterality Date  . 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 Breast lumpectomy  1992    Left breast  . Carpal tunnel release      Left hand 05/2012 and right hand dec 2013  . Trigger finger release  2015    Left thumb     Current  Outpatient Prescriptions  Medication Sig Dispense Refill  . ACCU-CHEK AVIVA PLUS test strip 1 each by Other route as needed for other (SUGAR CHECK).     Marland Kitchen atorvastatin (LIPITOR) 20 MG tablet Take 1/2 tablet by mouth once  daily    . buPROPion (WELLBUTRIN XL) 300 MG 24 hr tablet Take 300 mg by mouth daily. Take 1 tab daily    . HYDROcodone-acetaminophen (NORCO) 10-325 MG per tablet Take 2 tablets by mouth daily. Take as directed    . Insulin Detemir (LEVEMIR FLEXPEN [place]) Inject 30-45 Units into the skin every morning. Once daily    . insulin lispro (HUMALOG) 100 UNIT/ML injection Inject into the skin 3 (three) times daily before meals. As directed    . levothyroxine (SYNTHROID, LEVOTHROID) 75 MCG tablet Take 75 mcg by mouth daily before breakfast. 1 tab daily    . losartan (COZAAR) 25 MG tablet Take 25 mg by mouth daily. 1 tab daily    . Multiple Vitamin (MULTIVITAMIN) tablet Take 1 tablet by mouth daily.    . mupirocin ointment (BACTROBAN) 2 % Apply 1 application topically continuous as needed (CUTS).     . nisoldipine (SULAR) 20 MG 24 hr tablet  Take 20 mg by mouth daily. Take 1/2 tab daily for blood pressure    . pantoprazole (PROTONIX) 40 MG tablet Take 40 mg by mouth every other day.     . sertraline (ZOLOFT) 100 MG tablet Take 100 mg by mouth daily.    Marland Kitchen VICTOZA 18 MG/3ML SOPN Take 18 mLs by mouth daily. 1.2-1.8 injection  Once daily    . XARELTO 20 MG TABS tablet Take 1 tablet by mouth  every day 90 tablet 3   No current facility-administered medications for this visit.    Allergies:   Metoprolol tartrate; Neosporin; Norvasc; Other; and Percodan    Social History:  The patient  reports that she has never smoked. She has never used smokeless tobacco. She reports that she does not drink alcohol or use illicit drugs.   Family History:  The patient's family history includes Heart attack in her father; Hypertension in her father. There is no history of Stroke.    ROS:  Please see the  history of present illness.   Otherwise, review of systems are positive for nose bleeding.   All other systems are reviewed and negative.    PHYSICAL EXAM: VS:  BP 154/86 mmHg  Pulse 70  Ht 5\' 3"  (1.6 m)  Wt 285 lb 6.4 oz (129.457 kg)  BMI 50.57 kg/m2 , BMI Body mass index is 50.57 kg/(m^2). GEN: Well nourished, well developed, in no acute distress HEENT: normal Neck: no JVD, carotid bruits, or masses Cardiac: RRR; no murmurs, rubs, or gallops,no edema  Respiratory:  clear to auscultation bilaterally, normal work of breathing GI: soft, nontender, nondistended, + BS, obese MS: no deformity or atrophy Skin: warm and dry, no rash Neuro:  Strength and sensation are intact Psych: euthymic mood, full affect    Recent Labs: 03/26/2015: BUN 19; Creatinine, Ser 0.98; Potassium 4.5; Sodium 138; TSH 3.70   Lipid Panel No results found for: CHOL, TRIG, HDL, CHOLHDL, VLDL, LDLCALC, LDLDIRECT   Other studies Reviewed: Additional studies/ records that were reviewed today with results demonstrating: ENT note reviewed.   ASSESSMENT AND PLAN:  AFib: PAF.  This patients CHA2DS2-VASc Score and unadjusted Ischemic Stroke Rate (% per year) is equal to 4.8 % stroke rate/year from a score of 4  Above score calculated as 1 point each if present [CHF, HTN, DM, Vascular=MI/PAD/Aortic Plaque, Age if 65-74, or Female] Above score calculated as 2 points each if present [Age > 75, or Stroke/TIA/TE]  She would be a candidate for anticoagulation.     Epistaxis: Resolved off of Xarelto. Has not come back since restarting Xarelto ( she skips a few days a week).   She will enquire about the cost of Eliquis.  It will likely be the same as Xarelto. If it is cheaper, we will switch her.   We spoke about the risk of stroke being off of the anticoagulation.  The patient does not want to take the chance off of anticoagulation long-term.  She followed up with ENT. The plan will be for long-term anticoagulation, will  restart daily Eliquis. No procedure that can be performed will reduce her bleeding risk from an ENT.  Hypertension: Blood pressure control. Try to keep the blood pressure as low as possible to minimize nose bleeding.  Hyperlipidemia: Continue statin.  Diabetes: Managed by primary care.  She is trying to walk more as the weather improves.   Current medicines are reviewed at length with the patient today.  The patient concerns regarding her medicines were  addressed.  The following changes have been made:  Go back to daily Xarelto  Labs/ tests ordered today include:  No orders of the defined types were placed in this encounter.    Recommend 150 minutes/week of aerobic exercise Low fat, low carb, high fiber diet recommended  Disposition:   FU 1 year   Teresita Madura., MD  10/03/2015 9:54 AM    [address], [place], [place]  [address] Phone: [phone]; Fax: [phone]

## 2015-10-03 NOTE — Patient Instructions (Addendum)
Medication Instructions:  Same-no changes  Labwork: None  Testing/Procedures: None  Follow-Up: Your physician wants you to follow-up in: 1 year. You will receive a reminder letter in the mail two months in advance. If you don't receive a letter, please call our office to schedule the follow-up appointment.   Any Other Special Instructions Will Be Listed Below (If Applicable). IMPORTANT: Take Xarelto everyday.  Please check the price of Eliquis and call us at [phone] if you want to switch.     If you need a refill on your cardiac medications before your next appointment, please call your pharmacy.

## 2015-10-23 ENCOUNTER — Telehealth: Payer: Self-pay | Admitting: *Deleted

## 2015-10-23 NOTE — Telephone Encounter (Signed)
Received request for eliquis see ov note from 10/03/15  Epistaxis: Resolved off of Xarelto. Has not come back since restarting Xarelto ( she skips a few days a week). She will enquire about the cost of Eliquis. It will likely be the same as Xarelto. If it is cheaper, we will switch her. We spoke about the risk of stroke being off of the anticoagulation. The patient does not want to take the chance off of anticoagulation long-term. She followed up with ENT. The plan will be for long-term anticoagulation, will restart daily Eliquis. No procedure that can be performed will reduce her bleeding risk from an ENT.  Hypertension: Blood pressure control. Try to keep the blood pressure as low as possible to minimize nose bleeding.  Hyperlipidemia: Continue statin.  Diabetes: Managed by primary care.  She is trying to walk more as the weather improves.   Current medicines are reviewed at length with the patient today. The patient concerns regarding her medicines were addressed.  The following changes have been made: Go back to daily Xarelto  Labs/ tests ordered today include:  No orders of the defined types were placed in this encounter.   Recommend 150 minutes/week of aerobic exercise Low fat, low carb, high fiber diet recommended  Disposition: FU 1 year   Teresita Madura., MD  10/03/2015 9:54 AM  [address], [place], [place] [address] Phone: [phone]; Fax: [phone]   Patient Instructions     Medication Instructions:  Same-no changes  Labwork: None  Testing/Procedures: None  Follow-Up: Your physician wants you to follow-up in: 1 year. You will receive a reminder letter in the mail two months in advance. If you don't receive a letter, please call our office to schedule the follow-up appointment.   Any Other Special Instructions Will Be Listed Below (If Applicable). IMPORTANT: Take Xarelto everyday.   Please check the price of Eliquis and call us at [phone] if you want to switch.

## 2015-10-25 ENCOUNTER — Telehealth: Payer: Self-pay

## 2015-10-25 MED ORDER — APIXABAN 5 MG PO TABS: 5.0000 mg | ORAL_TABLET | Freq: Two times a day (BID) | ORAL | Status: DC

## 2015-10-25 NOTE — Telephone Encounter (Signed)
Prior auth for Eliquis 5 mg submitted to [facility]. 

## 2015-10-25 NOTE — Telephone Encounter (Signed)
**Note De-Identified Jolyne Laye Obfuscation** What is MG? Please advise.

## 2015-10-25 NOTE — Telephone Encounter (Signed)
**Note De-Identified Carrie Newton Obfuscation** Ok to fill Eliquis and unsure of what MG is?  Please advise.

## 2015-10-25 NOTE — Telephone Encounter (Signed)
5mg BID 

## 2015-10-25 NOTE — Telephone Encounter (Signed)
Rx sent to [facility] to fill. The pt is aware.

## 2015-10-25 NOTE — Telephone Encounter (Signed)
Eliquis 5 mg BID 

## 2015-10-25 NOTE — Telephone Encounter (Signed)
**Note De-Identified Carrie Newton Obfuscation** The pt has decided to take Eliquis. Do you want to start her at 5 mg BID please advise. Please advise.

## 2015-10-28 ENCOUNTER — Telehealth: Payer: Self-pay | Admitting: Interventional Cardiology

## 2015-10-28 ENCOUNTER — Telehealth: Payer: Self-pay

## 2015-10-28 NOTE — Telephone Encounter (Signed)
Spoke with pt and she states that she spoke with Jeani Hawking on Friday and Jeani Hawking had mentioned that pharmacy sent in a request for Diclofenac.  Pt wanted to know if Jeani Hawking still had the paper copy.  Advised pt that I am unsure and she is not here today. Pt is going to call [facility] and have them send prescription for Diclofenac to the proper office. Advised pt normally if it is not a prescription we handle, the paper copy gets gets faxed back to pharmacy and then shredded. Pt verbalized understanding and was very appreciative for the call back.

## 2015-10-28 NOTE — Telephone Encounter (Signed)
Eliquis approved through 07/05/2016. [id number].

## 2015-10-28 NOTE — Telephone Encounter (Signed)
Mrs. Vandenberg is calling to se if you still have the prescription for Diclofenac. If so please call   Thanks

## 2016-09-10 ENCOUNTER — Encounter: Payer: Self-pay | Admitting: Interventional Cardiology

## 2016-09-15 ENCOUNTER — Ambulatory Visit: Payer: Medicare Other | Admitting: Interventional Cardiology

## 2016-09-17 ENCOUNTER — Ambulatory Visit: Payer: Medicare Other | Admitting: Interventional Cardiology

## 2016-10-13 ENCOUNTER — Ambulatory Visit (INDEPENDENT_AMBULATORY_CARE_PROVIDER_SITE_OTHER): Payer: Medicare Other | Admitting: Interventional Cardiology

## 2016-10-13 ENCOUNTER — Encounter: Payer: Self-pay | Admitting: Interventional Cardiology

## 2016-10-13 VITALS — BP 136/100 | HR 86 | Ht 63.0 in | Wt 282.4 lb

## 2016-10-13 DIAGNOSIS — E782 Mixed hyperlipidemia: Secondary | ICD-10-CM

## 2016-10-13 DIAGNOSIS — Z794 Long term (current) use of insulin: Secondary | ICD-10-CM

## 2016-10-13 DIAGNOSIS — I480 Paroxysmal atrial fibrillation: Secondary | ICD-10-CM

## 2016-10-13 DIAGNOSIS — E669 Obesity, unspecified: Secondary | ICD-10-CM

## 2016-10-13 DIAGNOSIS — IMO0001 Reserved for inherently not codable concepts without codable children: Secondary | ICD-10-CM

## 2016-10-13 DIAGNOSIS — I10 Essential (primary) hypertension: Secondary | ICD-10-CM

## 2016-10-13 DIAGNOSIS — E119 Type 2 diabetes mellitus without complications: Secondary | ICD-10-CM

## 2016-10-13 DIAGNOSIS — Z7901 Long term (current) use of anticoagulants: Secondary | ICD-10-CM

## 2016-10-13 DIAGNOSIS — Z6841 Body Mass Index (BMI) 40.0 and over, adult: Secondary | ICD-10-CM | POA: Diagnosis not present

## 2016-10-13 NOTE — Progress Notes (Signed)
Patient ID: Carrie Newton, female   DOB: May 22, 1946, 71 y.o.   MRN: [id number]     Cardiology Office Note   Date:  10/13/2016   ID:  Carrie Newton, DOB [birth year], MRN [id number]  PCP:  Carrie Neer, MD    No chief complaint on file. AFib, nose bleeding, obesity   Wt Readings from Last 3 Encounters:  10/13/16 282 lb 6.4 oz (128.1 kg)  10/03/15 285 lb 6.4 oz (129.5 kg)  07/31/15 280 lb 12.8 oz (127.4 kg)       History of Present Illness: Carrie Newton is a 71 y.o. female  Who has had PAF diagnosed several years ago.  She has a normal EF with LA dimension of 4.7 cm.  She was treated with Xarelto for stroke prevention.  In November and December 2016, she experienced episodic nose bleeding. She was seen in the office. She was sent to ENT. There was a scab found on the bleeding area in her nose. She has not had any nose bleeds in the past year.   She is tolerating Eliquis.  She is using saline nasal spray.    Blood sugar has been better.  Morning lows have resolved after Levemir was switched to morning.  She is trying to lose weight by sleeping more, avoiding sugary drinks, walking more regularly.    No palpitations.  No bleeding problems.      Past Medical History:  Diagnosis Date  . Diabetes ([place])    Type 2  . History of echocardiogram 10/2012   Echo 4/14:  EF 60-65%, mild LAE, MAC, impaired relaxation  . Hypertension   . 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 PAF (paroxysmal atrial fibrillation) ([place])     Past Surgical History:  Procedure Laterality Date  . BREAST LUMPECTOMY  1992   Left breast  . CARPAL TUNNEL RELEASE     Left hand 05/2012 and right hand dec 2013  . TOTAL KNEE ARTHROPLASTY  07/2009   Left knee  . TRIGGER FINGER RELEASE  2015   Left thumb     Current Outpatient Prescriptions  Medication Sig Dispense Refill  . ACCU-CHEK AVIVA PLUS test strip 1 each by Other route as needed for other (SUGAR CHECK).     Marland Kitchen apixaban (ELIQUIS) 5 MG TABS tablet Take 1  tablet (5 mg total) by mouth 2 (two) times daily. 180 tablet 6  . atorvastatin (LIPITOR) 20 MG tablet Take 1/2 tablet by mouth once  daily    . buPROPion (WELLBUTRIN XL) 300 MG 24 hr tablet Take 300 mg by mouth daily. Take 1 tab daily    . HYDROcodone-acetaminophen (NORCO) 10-325 MG per tablet Take 2 tablets by mouth daily. Take as directed    . Insulin Detemir (LEVEMIR FLEXPEN [place]) Inject 30-45 Units into the skin every morning. Once daily    . insulin lispro (HUMALOG) 100 UNIT/ML injection Inject into the skin 3 (three) times daily before meals. As directed    . levothyroxine (SYNTHROID, LEVOTHROID) 75 MCG tablet Take 75 mcg by mouth daily before breakfast. 1 tab daily    . losartan (COZAAR) 25 MG tablet Take 25 mg by mouth daily. 1 tab daily    . Multiple Vitamin (MULTIVITAMIN) tablet Take 1 tablet by mouth daily.    . mupirocin ointment (BACTROBAN) 2 % Apply 1 application topically continuous as needed (CUTS).     . nisoldipine (SULAR) 20 MG 24 hr tablet Take 20 mg by mouth daily. Take 1/2 tab daily for blood  pressure    . pantoprazole (PROTONIX) 40 MG tablet Take 40 mg by mouth every other day.     . sertraline (ZOLOFT) 100 MG tablet Take 100 mg by mouth daily.    Marland Kitchen VICTOZA 18 MG/3ML SOPN Take 18 mLs by mouth daily. 1.2-1.8 injection  Once daily     No current facility-administered medications for this visit.     Allergies:   Metoprolol tartrate; Neosporin [neomycin-bacitracin zn-polymyx]; Norvasc [amlodipine besylate]; Other; and Percodan [oxycodone-aspirin]    Social History:  The patient  reports that she has never smoked. She has never used smokeless tobacco. She reports that she does not drink alcohol or use drugs.   Family History:  The patient's family history includes Heart attack in her father; Hypertension in her father.    ROS:  Please see the history of present illness.   Otherwise, review of systems are positive for nose bleeding.   All other systems are reviewed and  negative.    PHYSICAL EXAM: VS:  BP (!) 136/100 (BP Location: Right Arm, Patient Position: Sitting, Cuff Size: Large)   Pulse 86   Ht 5' 3"  (1.6 m)   Wt 282 lb 6.4 oz (128.1 kg)   SpO2 97%   BMI 50.02 kg/m  , BMI Body mass index is 50.02 kg/m. GEN: Well nourished, well developed, in no acute distress  HEENT: normal  Neck: no JVD, carotid bruits, or masses Cardiac: RRR; no murmurs, rubs, or gallops,no edema  Respiratory:  clear to auscultation bilaterally, normal work of breathing GI: soft, nontender, nondistended, + BS, obese MS: no deformity or atrophy  Skin: warm and dry, no rash Neuro:  Strength and sensation are intact Psych: euthymic mood, full affect    Recent Labs: No results found for requested labs within last 8760 hours.   Lipid Panel No results found for: CHOL, TRIG, HDL, CHOLHDL, VLDL, LDLCALC, LDLDIRECT   Other studies Reviewed: Additional studies/ records that were reviewed today with results demonstrating: 2016 monitor did not show AFib.   ASSESSMENT AND PLAN:  AFib: PAF.  This patients CHA2DS2-VASc Score and unadjusted Ischemic Stroke Rate (% per year) is equal to 4.8 % stroke rate/year from a score of 4  Above score calculated as 1 point each if present [CHF, HTN, DM, Vascular=MI/PAD/Aortic Plaque, Age if 65-74, or Female] Above score calculated as 2 points each if present [Age > 75, or Stroke/TIA/TE]  She would be a candidate for anticoagulation.     Epistaxis: Resolved off of Xarelto. Has not come back since restarting anticoagulation.  She switched to Eliquis for cost reasons.     Hypertension: Blood pressure controlled at home with systolics in the 448-185 range.  Pulse has been in the 60s for the most part. Try to keep the blood pressure as low as possible to minimize nose bleeding among other  Secondary prevention.  Still using saline nasal spray.   Hyperlipidemia: Continue statin.  Diabetes: Managed by primary care.  She is trying to walk  more as the weather improves.   Current medicines are reviewed at length with the patient today.  The patient concerns regarding her medicines were addressed.  The following changes have been made:   Labs/ tests ordered today include:  No orders of the defined types were placed in this encounter.   Recommend 150 minutes/week of aerobic exercise Low fat, low carb, high fiber diet recommended  Disposition:   FU 1 year   Signed, Larae Grooms, MD  10/13/2016 2:38 PM  [facility] [address], [place], [place]  [address] Phone: [phone]; Fax: [phone]

## 2016-10-13 NOTE — Patient Instructions (Signed)

## 2016-10-20 ENCOUNTER — Telehealth: Payer: Self-pay | Admitting: Interventional Cardiology

## 2016-10-20 MED ORDER — APIXABAN 5 MG PO TABS: 5.0000 mg | ORAL_TABLET | Freq: Two times a day (BID) | ORAL | 6 refills | Status: DC

## 2016-10-20 NOTE — Telephone Encounter (Signed)
Patient made aware that eliquis samples will be left up front for her to pick up. A refill for the eliquis was sent to the patient's preferred pharmacy. Patient verbalized understanding and was in agreement with this plan.

## 2016-10-20 NOTE — Telephone Encounter (Signed)
Patient states that she was instructed to call back for samples of Eliquis 5 mg. Please call to verify if we have samples.

## 2017-05-10 ENCOUNTER — Telehealth: Payer: Self-pay | Admitting: Interventional Cardiology

## 2017-05-10 NOTE — Telephone Encounter (Signed)
New message     STAT if HR is under 50 or over 120 (normal HR is 60-100 beats per minute)  1) What is your heart rate? 115   2) Do you have a log of your heart rate readings (document readings)?  83 138/80 (8a)  102 143/86 (9a)   3) Do you have any other symptoms?  136/76 , she is feeling a flutter

## 2017-05-10 NOTE — Telephone Encounter (Signed)
Returned call to patient who states that she had an episode this morning where she felt like her heart was fluttering while she was lying in bed. She states that this was the first time she has felt it in a long time. She states that she got up and checked her BP and it was 138/80 HR 83. She rechecked it an hour later and it was 143/86 HR 102. She states that she checked it one more time 15 minutes later and it was 136/76 HR 115. Patient denies having any chest pain, shortness of breath, lightheadedness, dizziness, or any other symptoms. Patient states that she took a few deep breaths to relax herself and her symptoms resolved. Patient states that she has not had any fluttering in her chest since this morning and denies having any complaints at this time. Patient states that she has not been sleeping well due to pain from her arthritis. Patient also admits to having an increased amount of stress in her life right now. Patient has a Hx of Afib. Patient has been taking her Eliquis and other cardiac meds as prescribed. Patient also has a Hx of Junctional Bradycardia with AVN blocking agents. Patient advised to continue to monitor her symptoms and let us know if they increase or worsen. Made patient aware that the information would be forwarded to Dr. Irish Lack for review. Patient verbalized understanding and thanked me for the call.

## 2017-05-10 NOTE — Telephone Encounter (Signed)
Left detailed message on patient's VM instructed patient to continue to monitor symptoms and if they persist we will order event monitor. Instructed for patient to call back with any questions.

## 2017-05-10 NOTE — Telephone Encounter (Signed)
If palpitations persist, woud consider event monitor.  Patient should contiue to monitor sx and let us know if they get worse.

## 2017-10-14 ENCOUNTER — Encounter: Payer: Self-pay | Admitting: Interventional Cardiology

## 2017-10-15 ENCOUNTER — Ambulatory Visit: Payer: Medicare Other | Admitting: Interventional Cardiology

## 2017-10-19 NOTE — Progress Notes (Signed)
Cardiology Office Note   Date:  10/20/2017   ID:  Carrie INTRIERI, DOB 20-Jun-1946, MRN [id number]  PCP:  Carrie Neer, MD    No chief complaint on file.  AFib  Wt Readings from Last 3 Encounters:  10/20/17 292 lb 1.9 oz (132.5 kg)  10/13/16 282 lb 6.4 oz (128.1 kg)  10/03/15 285 lb 6.4 oz (129.5 kg)       History of Present Illness: Carrie Newton is a 72 y.o. female  Who has had PAF diagnosed several years ago.  She has a normal EF with LA dimension of 4.7 cm.  She was treated with Xarelto for stroke prevention.  In November and December 2016, she experienced episodic nose bleeding. She was seen in the office. She was sent to ENT. There was a scab found on the bleeding area in her nose.  She has had some issues with hypoglycemia.  Denies : Chest pain. Dizziness. Leg edema. Nitroglycerin use. Orthopnea. Paroxysmal nocturnal dyspnea. Shortness of breath. Syncope.   She had some palpitations in 11/18.   She had some palpitations intermittently in March.  Her BP cuff indicated an irregular heartbeat once.  All of the other checks indicated normal heartbeat.  BP at home is only checked when she has an irregular heart beat or she is not feeling well.    Past Medical History:  Diagnosis Date  . Diabetes ([place])    Type 2  . History of echocardiogram 10/2012   Echo 4/14:  EF 60-65%, mild LAE, MAC, impaired relaxation  . Hypertension   . 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 PAF (paroxysmal atrial fibrillation) ([place])     Past Surgical History:  Procedure Laterality Date  . BREAST LUMPECTOMY  1992   Left breast  . CARPAL TUNNEL RELEASE     Left hand 05/2012 and right hand dec 2013  . TOTAL KNEE ARTHROPLASTY  07/2009   Left knee  . TRIGGER FINGER RELEASE  2015   Left thumb     Current Outpatient Medications  Medication Sig Dispense Refill  . ACCU-CHEK AVIVA PLUS test strip 1 each by Other route as needed for other (SUGAR CHECK).     Marland Kitchen apixaban (ELIQUIS) 5 MG TABS tablet  Take 1 tablet (5 mg total) by mouth 2 (two) times daily. 180 tablet 6  . atorvastatin (LIPITOR) 20 MG tablet Take 1/2 tablet by mouth once  daily    . buPROPion (WELLBUTRIN XL) 300 MG 24 hr tablet Take 300 mg by mouth daily. Take 1 tab daily    . felodipine (PLENDIL) 10 MG 24 hr tablet Take 10 mg daily by mouth.    Marland Kitchen HYDROcodone-acetaminophen (NORCO) 10-325 MG per tablet Take 2 tablets by mouth daily. Take as directed    . insulin lispro (HUMALOG) 100 UNIT/ML injection Inject into the skin 3 (three) times daily before meals. As directed    . levothyroxine (SYNTHROID, LEVOTHROID) 75 MCG tablet Take 75 mcg by mouth daily before breakfast. 1 tab daily    . losartan (COZAAR) 25 MG tablet Take 25 mg by mouth daily. 1 tab daily    . Multiple Vitamin (MULTIVITAMIN) tablet Take 1 tablet by mouth daily.    . mupirocin ointment (BACTROBAN) 2 % Apply 1 application topically continuous as needed (CUTS).     . pantoprazole (PROTONIX) 40 MG tablet Take 40 mg by mouth every other day.     . sertraline (ZOLOFT) 100 MG tablet Take 100 mg by mouth daily.  No current facility-administered medications for this visit.     Allergies:   Metoprolol tartrate; Neosporin [neomycin-bacitracin zn-polymyx]; Norvasc [amlodipine besylate]; Other; and Percodan [oxycodone-aspirin]    Social History:  The patient  reports that she has never smoked. She has never used smokeless tobacco. She reports that she does not drink alcohol or use drugs.   Family History:  The patient's family history includes Heart attack in her father; Hypertension in her father.    ROS:  Please see the history of present illness.   Otherwise, review of systems are positive for low blood sugars at times.   All other systems are reviewed and negative.    PHYSICAL EXAM: VS:  BP (!) 150/70   Newton 63   Ht _0  (1.6 m)   Wt 292 lb 1.9 oz (132.5 kg)   SpO2 99%   BMI 51.75 kg/m  , BMI Body mass index is 51.75 kg/m. GEN: Well nourished, well  developed, in no acute distress  HEENT: normal  Neck: no JVD, carotid bruits, or masses Cardiac: RRR; no murmurs, rubs, or gallops,no edema  Respiratory:  clear to auscultation bilaterally, normal work of breathing GI: soft, nontender, nondistended, + BS, obese MS: no deformity or atrophy  Skin: warm and dry, no rash Neuro:  Strength and sensation are intact Psych: euthymic mood, full affect   EKG:   The ekg ordered today demonstrates sinus bradycardia, non specific ST changes   Recent Labs: No results found for requested labs within last 8760 hours.   Lipid Panel No results found for: CHOL, TRIG, HDL, CHOLHDL, VLDL, LDLCALC, LDLDIRECT   Other studies Reviewed: Additional studies/ records that were reviewed today with results demonstrating: Phone notes reviewed from 11/18. .   ASSESSMENT AND PLAN:  1. AFib: In NSR now.  Eliquis for stroke prevention.  2. Hypertensive heart disease: BP high today.  She should check her BP routinely, when she feels well. Could increase losartan to 50 mg.  Labs to be checked with PMD at next appointment.  SHe will let us know if BP is increased at times when she is feeling well.  She will let us know if readings are above 130/80 consistently.  3. DM: Working on avoiding the hypoglycemia.  4. Hyperlipidemia: To be checked with Dr. Brigitte Newton.  LDL was 91 in 9/18. 5. Anticoagulated: No bleeding issues.  Please check CBC and BMet with next bloodwork in June.   Current medicines are reviewed at length with the patient today.  The patient concerns regarding her medicines were addressed.  The following changes have been made:  No change  Labs/ tests ordered today include:  No orders of the defined types were placed in this encounter.   Recommend 150 minutes/week of aerobic exercise Low fat, low carb, high fiber diet recommended  Disposition:   FU in 1 year   Signed, Carrie Grooms, MD  10/20/2017 11:26 AM    [facility] [address], [place], [place]  [address] Phone: [phone]; Fax: [phone]

## 2017-10-20 ENCOUNTER — Encounter: Payer: Self-pay | Admitting: Interventional Cardiology

## 2017-10-20 ENCOUNTER — Ambulatory Visit (INDEPENDENT_AMBULATORY_CARE_PROVIDER_SITE_OTHER): Payer: Medicare Other | Admitting: Interventional Cardiology

## 2017-10-20 VITALS — BP 150/70 | HR 63 | Ht 63.0 in | Wt 292.1 lb

## 2017-10-20 DIAGNOSIS — Z7901 Long term (current) use of anticoagulants: Secondary | ICD-10-CM

## 2017-10-20 DIAGNOSIS — I480 Paroxysmal atrial fibrillation: Secondary | ICD-10-CM

## 2017-10-20 DIAGNOSIS — Z794 Long term (current) use of insulin: Secondary | ICD-10-CM | POA: Diagnosis not present

## 2017-10-20 DIAGNOSIS — I119 Hypertensive heart disease without heart failure: Secondary | ICD-10-CM

## 2017-10-20 DIAGNOSIS — E119 Type 2 diabetes mellitus without complications: Secondary | ICD-10-CM | POA: Diagnosis not present

## 2017-10-20 DIAGNOSIS — E782 Mixed hyperlipidemia: Secondary | ICD-10-CM

## 2017-10-20 NOTE — Patient Instructions (Signed)
Medication Instructions:  Your physician recommends that you continue on your current medications as directed. Please refer to the Current Medication list given to you today.   Labwork: Please have your labs drawn at your [place] office and fax the results to [place]  Testing/Procedures: None ordered  Follow-Up: Your physician wants you to follow-up in: 1 year with Dr. Irish Lack. You will receive a reminder letter in the mail two months in advance. If you don't receive a letter, please call our office to schedule the follow-up appointment.   Any Other Special Instructions Will Be Listed Below (If Applicable).  Continue to monitor your Blood Pressure at home ad let us know if it is consistently greater than 130/80.   If you need a refill on your cardiac medications before your next appointment, please call your pharmacy.

## 2017-11-30 ENCOUNTER — Other Ambulatory Visit: Payer: Self-pay | Admitting: Interventional Cardiology

## 2017-12-01 NOTE — Telephone Encounter (Signed)
Pt is a 72 yr old female who last saw Dr Irish Lack on 10/20/2017, weight at that time was 132.5Kg. Last SCr on 03/15/17 was 0.75. Will refill Eliquis 5mg  BID.

## 2018-03-10 ENCOUNTER — Telehealth: Payer: Self-pay | Admitting: Interventional Cardiology

## 2018-03-10 DIAGNOSIS — I480 Paroxysmal atrial fibrillation: Secondary | ICD-10-CM

## 2018-03-10 NOTE — Telephone Encounter (Signed)
Returned call to patient. Patient states that she has been having more episodes of irregular heartbeats, especially today. Patient states that her BP and HRs are as follows: 117/83 124, 84/67 76, 117/80 94, 108/63 101. Patient denies having any chest pain, SOB, lightheadedness, dizziness, syncope, or any other Sx. She states that she could just feel the fluttering in her chest earlier. Patient denies any Sx at this time. Patient denies any caffeine or alcohol. Patient states that she does not sleep well due to her arthritis. Patient does have a Hx of Afib. Patient has not missed any of her Eliquis. Patient also takes felodipine 10 mg QD and losartan 25 mg QD. Patient has a Hx of Junctional Bradycardia with AVN blocking agents. Last echo 2014. Will see if Dr. Irish Lack okay with ordering a monitor/echo. Made patient aware that I would call her back with further recommendations.

## 2018-03-10 NOTE — Telephone Encounter (Signed)
° ° °  STAT if HR is under 50 or over 120 (normal HR is 60-100 beats per minute)  What is your heart rate? 101 1) Do you have a log of your heart rate readings (document readings)?  BP 108/63 HR 101, BP 84/67 HR 76, BP 117/83 HR 124  2) Do you have any other symptoms? No

## 2018-03-10 NOTE — Telephone Encounter (Signed)
Continuous tele for AFib

## 2018-03-10 NOTE — Telephone Encounter (Signed)
Called patient and made her aware that we will order the monitor for Afib. Patient aware that she will be contacted to schedule appointment.

## 2018-03-23 ENCOUNTER — Ambulatory Visit (INDEPENDENT_AMBULATORY_CARE_PROVIDER_SITE_OTHER): Payer: Medicare Other

## 2018-03-23 DIAGNOSIS — I480 Paroxysmal atrial fibrillation: Secondary | ICD-10-CM | POA: Diagnosis not present

## 2018-03-25 ENCOUNTER — Telehealth: Payer: Self-pay | Admitting: Interventional Cardiology

## 2018-03-25 NOTE — Telephone Encounter (Signed)
Spoke with the patient, she ordered her Eliqus late and it will take a few days to get the medication. I am getting samples and putting them up front for her to pick up.

## 2018-03-25 NOTE — Telephone Encounter (Signed)
New Message   Patient calling the office for samples of medication:   1.  What medication and dosage are you requesting samples for? ELIQUIS 5 MG TABS tablet  2.  Are you currently out of this medication? yes

## 2018-05-13 ENCOUNTER — Other Ambulatory Visit: Payer: Self-pay | Admitting: Interventional Cardiology

## 2018-05-13 NOTE — Telephone Encounter (Signed)
BMET checked at [facility] 03/30/18 - SCr 0.87

## 2018-10-25 ENCOUNTER — Ambulatory Visit: Payer: Medicare Other | Admitting: Interventional Cardiology

## 2018-11-04 ENCOUNTER — Telehealth: Payer: Medicare Other | Admitting: Interventional Cardiology

## 2018-11-17 ENCOUNTER — Other Ambulatory Visit: Payer: Self-pay | Admitting: Interventional Cardiology

## 2019-02-16 ENCOUNTER — Telehealth: Payer: Self-pay | Admitting: Interventional Cardiology

## 2019-02-16 ENCOUNTER — Telehealth: Payer: Self-pay | Admitting: *Deleted

## 2019-02-16 NOTE — Telephone Encounter (Signed)
New message   Patient has questions about her upcoming office visit. Please call.

## 2019-02-16 NOTE — Telephone Encounter (Signed)
Patient with diagnosis of afib on Eliquis for anticoagulation.    Procedure:COLONOSCOPY/ENDOSCOPY  Date of procedure: 04/05/2019  CHADS2-VASc score of  4 (HTN, AGE, DM2,female)  CrCl 81 ml/min  Per office protocol, patient can hold Eliquis for 2 days prior to procedure.

## 2019-02-16 NOTE — Telephone Encounter (Signed)
   [facility] Pre-operative Risk Assessment    Request for surgical clearance:  1. What type of surgery is being performed? COLONOSCOPY/ENDOSCOPY   2. When is this surgery scheduled? 04/05/19   3. What type of clearance is required (medical clearance vs. Pharmacy clearance to hold med vs. Both)? BOTH  4. Are there any medications that need to be held prior to surgery and how long? ELIQUIS 2 DAYS PRIOR   5. Practice name and name of physician performing surgery? [facility]; DR. Penelope Coop   6. What is your office phone number [phone]    7.   What is your office fax number [phone]  8.   Anesthesia type (None, local, MAC, general) ? NONE LISTED, PROPOFOL?   Julaine Hua 02/16/2019, 10:36 AM  _________________________________________________________________   (provider comments below)

## 2019-02-16 NOTE — Telephone Encounter (Signed)
   Sterrett, MD  Chart reviewed as part of pre-operative protocol coverage. Because of Carrie Newton past medical history and most recent symptoms of palpitations,he/she will require a follow-up visit that is scheduled for 02/28/2019 with Dr. Irish Lack in order to better assess preoperative cardiovascular risk.  Pre-op covering staff:  -Please contact requesting surgeon's office via preferred method (i.e, phone, fax) to inform them of need for appointment prior to surgery.   Kathyrn Drown, NP  02/16/2019, 11:49 AM

## 2019-02-16 NOTE — Telephone Encounter (Signed)
Called patient and informed her that she would most likely have a ekg done at next appointment since she hasn't been seen in over a year. I also let her know she is welcome to ask for one at her appointment. This was the main concern of her call. Patient verbally understood. No further questions.

## 2019-02-26 NOTE — Progress Notes (Signed)
Cardiology Office Note   Date:  02/28/2019   ID:  Sierria, Bruney Jul 24, 1945, MRN [id number]  PCP:  Mayra Neer, MD    No chief complaint on file.  AFib  Wt Readings from Last 3 Encounters:  02/28/19 286 lb (129.7 kg)  10/20/17 292 lb 1.9 oz (132.5 kg)  10/13/16 282 lb 6.4 oz (128.1 kg)       History of Present Illness: Carrie Newton is a 73 y.o. female  Who has had PAF diagnosed several years ago. She has a normal EF with LA dimension of 4.7 cm. She was treated with Xarelto for stroke prevention. In November and December 2016, she experienced episodic nose bleeding. She was seen in the office. She was sent to ENT. There was a scab found on the bleeding area in her nose.  She has had some issues with hypoglycemia.  Has had occasional palpitations in the past.   Needs colonoscopy.  Denies : Chest pain. Dizziness. Leg edema. Nitroglycerin use. Orthopnea. Palpitations. Paroxysmal nocturnal dyspnea. Shortness of breath. Syncope.   Eating healthy as well.  Some days where she does not eat at all.    Wearing her mask and avoiding crowds.   Past Medical History:  Diagnosis Date  . Diabetes ([place])    Type 2  . History of echocardiogram 10/2012   Echo 4/14:  EF 60-65%, mild LAE, MAC, impaired relaxation  . Hypertension   . 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 PAF (paroxysmal atrial fibrillation) ([place])     Past Surgical History:  Procedure Laterality Date  . BREAST LUMPECTOMY  1992   Left breast  . CARPAL TUNNEL RELEASE     Left hand 05/2012 and right hand dec 2013  . TOTAL KNEE ARTHROPLASTY  07/2009   Left knee  . TRIGGER FINGER RELEASE  2015   Left thumb     Current Outpatient Medications  Medication Sig Dispense Refill  . ACCU-CHEK AVIVA PLUS test strip 1 each by Other route as needed for other (SUGAR CHECK).     Marland Kitchen atorvastatin (LIPITOR) 20 MG tablet Take 20 mg by mouth daily at 6 PM.     . buPROPion (WELLBUTRIN XL) 300 MG 24 hr tablet Take 300 mg by  mouth daily. Take 1 tab daily    . ELIQUIS 5 MG TABS tablet TAKE 1 TABLET BY MOUTH TWO  TIMES DAILY 180 tablet 1  . felodipine (PLENDIL) 10 MG 24 hr tablet Take 10 mg daily by mouth.    Marland Kitchen HYDROcodone-acetaminophen (NORCO) 10-325 MG per tablet Take 2 tablets by mouth daily. Take as directed    . Insulin Degludec (TRESIBA) 100 UNIT/ML SOLN Inject into the skin. Use depends on blood sugar level.    . insulin lispro (HUMALOG) 100 UNIT/ML injection Inject into the skin 3 (three) times daily before meals. Use as needed for blood sugar levels before meals.    Marland Kitchen levothyroxine (SYNTHROID) 88 MCG tablet Take 88 mcg by mouth daily before breakfast. Alternate with 75 mcg    . levothyroxine (SYNTHROID, LEVOTHROID) 75 MCG tablet Take 75 mcg by mouth daily before breakfast. Alternate with 88 mcg tab    . losartan (COZAAR) 25 MG tablet Take 25 mg by mouth daily. 1 tab daily    . Multiple Vitamin (MULTIVITAMIN) tablet Take 1 tablet by mouth daily.    . mupirocin ointment (BACTROBAN) 2 % Apply 1 application topically continuous as needed (CUTS).     Marland Kitchen OZEMPIC, 1 MG/DOSE, 2 MG/1.5ML  SOPN     . pantoprazole (PROTONIX) 40 MG tablet Take 40 mg by mouth every other day.     . sertraline (ZOLOFT) 100 MG tablet Take 100 mg by mouth daily.     No current facility-administered medications for this visit.     Allergies:   Metoprolol tartrate, Neosporin [neomycin-bacitracin zn-polymyx], Norvasc [amlodipine besylate], Other, and Percodan [oxycodone-aspirin]    Social History:  The patient  reports that she has never smoked. She has never used smokeless tobacco. She reports that she does not drink alcohol or use drugs.   Family History:  The patient's family history includes Heart attack in her father; Hypertension in her father.    ROS:  Please see the history of present illness.   Otherwise, review of systems are positive for not exercising much.   All other systems are reviewed and negative.    PHYSICAL EXAM: VS:   BP 132/88   Pulse 67   Ht _0  (1.6 m)   Wt 286 lb (129.7 kg)   SpO2 99%   BMI 50.66 kg/m  , BMI Body mass index is 50.66 kg/m. GEN: Well nourished, well developed, in no acute distress  HEENT: normal  Neck: no JVD, carotid bruits, or masses Cardiac: RRR; no murmurs, rubs, or gallops,no edema  Respiratory:  clear to auscultation bilaterally, normal work of breathing GI: soft, nontender, nondistended, + BS MS: no deformity or atrophy  Skin: warm and dry, no rash Neuro:  Strength and sensation are intact Psych: euthymic mood, full affect   EKG:   The ekg ordered today demonstrates NSR, no ST changes   Recent Labs: No results found for requested labs within last 8760 hours.   Lipid Panel No results found for: CHOL, TRIG, HDL, CHOLHDL, VLDL, LDLCALC, LDLDIRECT   Other studies Reviewed: Additional studies/ records that were reviewed today with results demonstrating: EF 60-65% in 2014.   ASSESSMENT AND PLAN:  1. AFib: in NSR now. Eliquis for stroke prevention. Maintaining NSR.  2. Preoperative cardiovascular exam:  Needs colonoscopy.  No further cardiac testing needed before colonoscopy.  Will likely hold anticoagulation for 2 days prior to colonoscopy. Will give specific instruction when we have the date of procedure. 3. Hypertensive heart disease: The current medical regimen is effective;  continue present plan and medications.  Home readings have been controlled.  4. DM: A1C 7.2 in 2020.  Avoid concentrated sugars, including in grapes and ripe bananas. 5. Anticoagulated: No bleeding issues.    Current medicines are reviewed at length with the patient today.  The patient concerns regarding her medicines were addressed.  The following changes have been made:  No change  Labs/ tests ordered today include:  No orders of the defined types were placed in this encounter.   Recommend 150 minutes/week of aerobic exercise Low fat, low carb, high fiber diet recommended   Disposition:   FU in 1 year   Signed, Larae Grooms, MD  02/28/2019 11:43 AM    [facility] [place], [place], [place]  [address] Phone: [phone]; Fax: [phone]

## 2019-02-28 ENCOUNTER — Other Ambulatory Visit: Payer: Self-pay

## 2019-02-28 ENCOUNTER — Ambulatory Visit (INDEPENDENT_AMBULATORY_CARE_PROVIDER_SITE_OTHER): Payer: Medicare Other | Admitting: Interventional Cardiology

## 2019-02-28 ENCOUNTER — Encounter: Payer: Self-pay | Admitting: Interventional Cardiology

## 2019-02-28 VITALS — BP 132/88 | HR 67 | Ht 63.0 in | Wt 286.0 lb

## 2019-02-28 DIAGNOSIS — I480 Paroxysmal atrial fibrillation: Secondary | ICD-10-CM | POA: Diagnosis not present

## 2019-02-28 DIAGNOSIS — E782 Mixed hyperlipidemia: Secondary | ICD-10-CM

## 2019-02-28 DIAGNOSIS — Z7901 Long term (current) use of anticoagulants: Secondary | ICD-10-CM | POA: Diagnosis not present

## 2019-02-28 DIAGNOSIS — Z794 Long term (current) use of insulin: Secondary | ICD-10-CM

## 2019-02-28 DIAGNOSIS — E119 Type 2 diabetes mellitus without complications: Secondary | ICD-10-CM | POA: Diagnosis not present

## 2019-02-28 DIAGNOSIS — I119 Hypertensive heart disease without heart failure: Secondary | ICD-10-CM

## 2019-02-28 NOTE — Telephone Encounter (Signed)
Pt seen by Dr Irish Lack today and cleared from a cardiac perspective: "Preoperative cardiovascular exam:  Needs colonoscopy.  No further cardiac testing needed before colonoscopy.  Will likely hold anticoagulation for 2 days prior to colonoscopy. Will give specific instruction when we have the date of procedure."  Ok to hold Eliquis for 2 days prior to procedure.

## 2019-02-28 NOTE — Patient Instructions (Signed)

## 2019-02-28 NOTE — Telephone Encounter (Signed)
   Primary Cardiologist: Larae Grooms, MD  Chart reviewed as part of pre-operative protocol coverage. Patient was contacted 02/28/2019 in reference to pre-operative risk assessment for pending surgery as outlined below.  Carrie Newton was last seen on 02/28/19 by Dr. Irish Lack.    "Preoperative cardiovascular exam: Needs colonoscopy. No further cardiac testing needed before colonoscopy. Will likelyhold anticoagulation for 2 days prior to colonoscopy.Will give specific instruction when we have the date of procedure."  Therefore, based on ACC/AHA guidelines, the patient would be at acceptable risk for the planned procedure without further cardiovascular testing.   I will route this recommendation to the requesting party via Epic fax function and remove from pre-op pool.  Please call with questions.  Tami Lin Telisa Ohlsen, PA 02/28/2019, 4:03 PM

## 2019-04-05 ENCOUNTER — Encounter (HOSPITAL_COMMUNITY): Payer: Self-pay

## 2019-04-05 ENCOUNTER — Ambulatory Visit (HOSPITAL_COMMUNITY)
Admission: AD | Admit: 2019-04-05 | Discharge: 2019-04-05 | Disposition: A | Discharge status: Home / Self Care | Payer: Medicare Other | Source: Ambulatory Visit | Attending: Gastroenterology | Admitting: Gastroenterology

## 2019-04-05 ENCOUNTER — Ambulatory Visit (HOSPITAL_COMMUNITY): Payer: Medicare Other | Admitting: Certified Registered"

## 2019-04-05 ENCOUNTER — Encounter (HOSPITAL_COMMUNITY)
Admission: AD | Disposition: A | Discharge status: Home / Self Care | Payer: Self-pay | Source: Ambulatory Visit | Attending: Gastroenterology

## 2019-04-05 ENCOUNTER — Other Ambulatory Visit: Payer: Self-pay

## 2019-04-05 DIAGNOSIS — R110 Nausea: Secondary | ICD-10-CM | POA: Diagnosis not present

## 2019-04-05 DIAGNOSIS — Z8619 Personal history of other infectious and parasitic diseases: Secondary | ICD-10-CM | POA: Diagnosis not present

## 2019-04-05 DIAGNOSIS — K219 Gastro-esophageal reflux disease without esophagitis: Secondary | ICD-10-CM | POA: Insufficient documentation

## 2019-04-05 DIAGNOSIS — I10 Essential (primary) hypertension: Secondary | ICD-10-CM | POA: Insufficient documentation

## 2019-04-05 DIAGNOSIS — I4891 Unspecified atrial fibrillation: Secondary | ICD-10-CM | POA: Diagnosis not present

## 2019-04-05 DIAGNOSIS — E119 Type 2 diabetes mellitus without complications: Secondary | ICD-10-CM | POA: Insufficient documentation

## 2019-04-05 DIAGNOSIS — K2950 Unspecified chronic gastritis without bleeding: Secondary | ICD-10-CM | POA: Diagnosis not present

## 2019-04-05 DIAGNOSIS — Z1211 Encounter for screening for malignant neoplasm of colon: Secondary | ICD-10-CM | POA: Insufficient documentation

## 2019-04-05 DIAGNOSIS — Z86010 Personal history of colonic polyps: Secondary | ICD-10-CM | POA: Insufficient documentation

## 2019-04-05 HISTORY — PX: COLONOSCOPY WITH PROPOFOL: SHX5780

## 2019-04-05 HISTORY — DX: Other complications of anesthesia, initial encounter: T88.59XA

## 2019-04-05 HISTORY — PX: ESOPHAGOGASTRODUODENOSCOPY (EGD) WITH PROPOFOL: SHX5813

## 2019-04-05 HISTORY — PX: BIOPSY: SHX5522

## 2019-04-05 SURGERY — ESOPHAGOGASTRODUODENOSCOPY (EGD) WITH PROPOFOL
Anesthesia: General

## 2019-04-05 SURGERY — ESOPHAGOGASTRODUODENOSCOPY (EGD) WITH PROPOFOL
Anesthesia: Monitor Anesthesia Care

## 2019-04-05 MED ORDER — PROPOFOL 500 MG/50ML IV EMUL
INTRAVENOUS | Status: DC | PRN
  Administered 2019-04-05: 150 ug/kg/min via INTRAVENOUS

## 2019-04-05 MED ORDER — LACTATED RINGERS IV SOLN
INTRAVENOUS | Status: DC
  Administered 2019-04-05: 11:00:00 via INTRAVENOUS

## 2019-04-05 MED ORDER — LIDOCAINE 2% (20 MG/ML) 5 ML SYRINGE
INTRAMUSCULAR | Status: DC | PRN
  Administered 2019-04-05: 100 mg via INTRAVENOUS

## 2019-04-05 MED ORDER — BUTAMBEN-TETRACAINE-BENZOCAINE 2-2-14 % EX AERO
INHALATION_SPRAY | CUTANEOUS | Status: DC | PRN
  Administered 2019-04-05: 11:00:00 2 via TOPICAL

## 2019-04-05 MED ORDER — SODIUM CHLORIDE 0.9 % IV SOLN: INTRAVENOUS | Status: DC

## 2019-04-05 MED ORDER — PROPOFOL 10 MG/ML IV BOLUS
INTRAVENOUS | Status: DC | PRN
  Administered 2019-04-05: 50 mg via INTRAVENOUS
  Administered 2019-04-05: 20 mg via INTRAVENOUS
  Administered 2019-04-05: 30 mg via INTRAVENOUS

## 2019-04-05 MED ORDER — DEXMEDETOMIDINE HCL 200 MCG/2ML IV SOLN
INTRAVENOUS | Status: DC | PRN
  Administered 2019-04-05: 8 ug via INTRAVENOUS
  Administered 2019-04-05: 20 ug via INTRAVENOUS

## 2019-04-05 MED ORDER — PHENYLEPHRINE HCL (PRESSORS) 10 MG/ML IV SOLN
INTRAVENOUS | Status: DC | PRN
  Administered 2019-04-05: 80 ug via INTRAVENOUS

## 2019-04-05 SURGICAL SUPPLY — 25 items

## 2019-04-05 NOTE — Discharge Instructions (Signed)

## 2019-04-05 NOTE — Anesthesia Postprocedure Evaluation (Signed)
Anesthesia Post Note  Patient: LAURNA SHETLEY  Procedure(s) Performed: ESOPHAGOGASTRODUODENOSCOPY (EGD) WITH PROPOFOL (N/A ) COLONOSCOPY WITH PROPOFOL (N/A ) BIOPSY     Patient location during evaluation: PACU Anesthesia Type: MAC Level of consciousness: awake and alert Pain management: pain level controlled Vital Signs Assessment: post-procedure vital signs reviewed and stable Respiratory status: spontaneous breathing, nonlabored ventilation and respiratory function stable Cardiovascular status: blood pressure returned to baseline and stable Postop Assessment: no apparent nausea or vomiting Anesthetic complications: no    Last Vitals:  Vitals:   04/05/19 1049 04/05/19 1200  BP: (!) 130/46 (!) 141/88  Pulse:  72  Resp:  (!) 27  Temp:  36.7 C  SpO2:  97%    Last Pain:  Vitals:   04/05/19 1200  TempSrc: Oral  PainSc: 0-No pain                 Pervis Hocking

## 2019-04-05 NOTE — H&P (Signed)
The patient is a 73 year old female who presents to the endoscopy unit for EGD and colonoscopy.  EGD is being done because of ongoing problems with intermittent nausea.  Colonoscopy has been done for surveillance because of a history of adenomatous colon polyps.  She has a history of H. pylori in the past.  She has been taking Eliquis but has held it.  She has a history of diabetes mellitus.  Physical no distress  Heart regular rhythm  Lungs clear  Abdomen soft and nontender  Impression: Nausea  History of colon polyps  Plan: EGD and colonoscopy

## 2019-04-05 NOTE — Op Note (Signed)
[facility] Patient Name: Carrie Newton Procedure Date : 04/05/2019 MRN: [id number] Attending MD: Wonda Horner , MD Date of Birth: 19-Oct-1945 CSN: [id number] Age: 73 Admit Type: Outpatient Procedure:                Upper GI endoscopy Indications:              Nausea Providers:                Wonda Horner, MD, Angus Seller, Lazaro Arms,                            Technician, Marguerita Merles, Technician Referring MD:              Medicines:                Propofol per Anesthesia Complications:            No immediate complications. Estimated Blood Loss:     Estimated blood loss was minimal. Procedure:                Pre-Anesthesia Assessment:                           - Prior to the procedure, a History and Physical                            was performed, and patient medications and                            allergies were reviewed. The patient's tolerance of                            previous anesthesia was also reviewed. The risks                            and benefits of the procedure and the sedation                            options and risks were discussed with the patient.                            All questions were answered, and informed consent                            was obtained. Prior Anticoagulants: The patient has                            taken Eliquis (apixaban), last dose was 3 days                            prior to procedure. ASA Grade Assessment: III - A                            patient with severe systemic disease. After  reviewing the risks and benefits, the patient was                            deemed in satisfactory condition to undergo the                            procedure.                           After obtaining informed consent, the endoscope was                            passed under direct vision. Throughout the                            procedure, the patient's blood pressure, pulse, and                           oxygen saturations were monitored continuously. The                            [id number] Olympus gastroscope was                            introduced through the mouth, and advanced to the                            second part of duodenum. The upper GI endoscopy was                            accomplished without difficulty. The patient                            tolerated the procedure well. Scope In: Scope Out: Findings:      The examined esophagus was normal.      Mildly erythematous mucosa was found in the gastric antrum. Biopsies       were taken with a cold forceps for Helicobacter pylori testing.      The examined duodenum was normal. Impression:               - Normal esophagus.                           - Erythematous mucosa in the antrum. Biopsied.                           - Normal examined duodenum. Recommendation:           - Resume regular diet.                           - Continue present medications.                           - Await pathology results. Procedure Code(s):        --- Professional ---  [id number], Esophagogastroduodenoscopy, flexible,                            transoral; with biopsy, single or multiple Diagnosis Code(s):        --- Professional ---                           K31.89, Other diseases of stomach and duodenum                           R11.0, Nausea CPT copyright 2019 American Medical Association. All rights reserved. The codes documented in this report are preliminary and upon coder review may  be revised to meet current compliance requirements. Wonda Horner, MD 04/05/2019 12:00:57 PM This report has been signed electronically. Number of Addenda: 0

## 2019-04-05 NOTE — Transfer of Care (Signed)
Immediate Anesthesia Transfer of Care Note  Patient: Carrie Newton  Procedure(s) Performed: ESOPHAGOGASTRODUODENOSCOPY (EGD) WITH PROPOFOL (N/A ) COLONOSCOPY WITH PROPOFOL (N/A ) BIOPSY  Patient Location: PACU  Anesthesia Type:MAC  Level of Consciousness: drowsy  Airway & Oxygen Therapy: Patient Spontanous Breathing and Patient connected to face mask oxygen  Post-op Assessment: Report given to RN and Post -op Vital signs reviewed and stable  Post vital signs: Reviewed and stable  Last Vitals:  Vitals Value Taken Time  BP 141/88 04/05/19 1203  Temp    Pulse 71 04/05/19 1204  Resp 22 04/05/19 1204  SpO2 96 % 04/05/19 1204  Vitals shown include unvalidated device data.  Last Pain:  Vitals:   04/05/19 1039  TempSrc: Oral  PainSc: 0-No pain         Complications: No apparent anesthesia complications

## 2019-04-05 NOTE — Anesthesia Preprocedure Evaluation (Addendum)
Anesthesia Evaluation  Patient identified by MRN, date of birth, ID band Patient awake    Reviewed: Allergy & Precautions, NPO status , Patient's Chart, lab work & pertinent test results, reviewed documented beta blocker date and time   History of Anesthesia Complications (+) PONV and history of anesthetic complications  Airway Mallampati: II  TM Distance: >3 FB Neck ROM: Full    Dental no notable dental hx.    Pulmonary neg pulmonary ROS,    Pulmonary exam normal breath sounds clear to auscultation       Cardiovascular hypertension, Pt. on medications and Pt. on home beta blockers Normal cardiovascular exam+ dysrhythmias Atrial Fibrillation  Rhythm:Regular Rate:Normal  Last echo 2014: Echo 4/14:  EF 60-65%, mild LAE, MAC, impaired relaxation   Neuro/Psych negative neurological ROS  negative psych ROS   GI/Hepatic Neg liver ROS, GERD  Medicated,Hx h pylori, on protonix   Endo/Other  diabetes, Well Controlled, Type 2, Oral Hypoglycemic AgentsHypothyroidism Morbid obesityObesity BMI >50  Renal/GU negative Renal ROS  negative genitourinary   Musculoskeletal negative musculoskeletal ROS (+)   Abdominal (+) + obese,   Peds  Hematology negative hematology ROS (+)   Anesthesia Other Findings   Reproductive/Obstetrics negative OB ROS                           Anesthesia Physical Anesthesia Plan  ASA: III  Anesthesia Plan: MAC   Post-op Pain Management:    Induction: Intravenous  PONV Risk Score and Plan: 3 and Treatment may vary due to age or medical condition, Propofol infusion and TIVA  Airway Management Planned: Natural Airway and Nasal Cannula  Additional Equipment: None  Intra-op Plan:   Post-operative Plan: Extubation in OR  Informed Consent: I have reviewed the patients History and Physical, chart, labs and discussed the procedure including the risks, benefits and  alternatives for the proposed anesthesia with the patient or authorized representative who has indicated his/her understanding and acceptance.     Dental advisory given  Plan Discussed with:   Anesthesia Plan Comments:        Anesthesia Quick Evaluation

## 2019-04-05 NOTE — Op Note (Signed)
[facility] Patient Name: Carrie Newton Procedure Date : 04/05/2019 MRN: [id number] Attending MD: Wonda Horner , MD Date of Birth: 10/31/1945 CSN: [id number] Age: 73 Admit Type: Outpatient Procedure:                Colonoscopy Indications:              High risk colon cancer surveillance: Personal                            history of colonic polyps, Last colonoscopy 5 years                            ago Providers:                Wonda Horner, MD, Angus Seller, Marguerita Merles, Technician, Lazaro Arms, Technician Referring MD:              Medicines:                Propofol per Anesthesia Complications:            No immediate complications. Estimated Blood Loss:     Estimated blood loss: none. Procedure:                Pre-Anesthesia Assessment:                           - Prior to the procedure, a History and Physical                            was performed, and patient medications and                            allergies were reviewed. The patient's tolerance of                            previous anesthesia was also reviewed. The risks                            and benefits of the procedure and the sedation                            options and risks were discussed with the patient.                            All questions were answered, and informed consent                            was obtained. Prior Anticoagulants: The patient has                            taken Eliquis (apixaban), last dose was 3 days  prior to procedure. ASA Grade Assessment: III - A                            patient with severe systemic disease. After                            reviewing the risks and benefits, the patient was                            deemed in satisfactory condition to undergo the                            procedure.                           After obtaining informed consent, the colonoscope            was passed under direct vision. Throughout the                            procedure, the patient's blood pressure, pulse, and                            oxygen saturations were monitored continuously. The                            [id number]) Olympus colonoscope was                            introduced through the anus and advanced to the the                            cecum, identified by appendiceal orifice and                            ileocecal valve. The ileocecal valve, appendiceal                            orifice, and rectum were photographed. The                            colonoscopy was performed without difficulty. The                            patient tolerated the procedure well. The quality                            of the bowel preparation was adequate. Scope In: 11:43:36 AM Scope Out: 11:55:38 AM Scope Withdrawal Time: 0 hours 4 minutes 50 seconds  Total Procedure Duration: 0 hours 12 minutes 2 seconds  Findings:      The perianal and digital rectal examinations were normal.      The colon (entire examined portion) appeared normal. Impression:               - The entire examined colon is normal.                           -  No specimens collected. Recommendation:           - Resume regular diet.                           - Continue present medications.                           - Repeat colonoscopy is not recommended for                            surveillance. Procedure Code(s):        --- Professional ---                           [phone], Colonoscopy, flexible; diagnostic, including                            collection of specimen(s) by brushing or washing,                            when performed (separate procedure) Diagnosis Code(s):        --- Professional ---                           Z86.010, Personal history of colonic polyps CPT copyright 2019 American Medical Association. All rights reserved. The codes documented in this report are  preliminary and upon coder review may  be revised to meet current compliance requirements. Wonda Horner, MD 04/05/2019 12:04:41 PM This report has been signed electronically. Number of Addenda: 0

## 2019-04-06 ENCOUNTER — Other Ambulatory Visit: Payer: Self-pay | Admitting: Physician Assistant

## 2019-04-07 LAB — SURGICAL PATHOLOGY

## 2019-04-10 ENCOUNTER — Telehealth: Payer: Self-pay | Admitting: Interventional Cardiology

## 2019-04-10 NOTE — Telephone Encounter (Signed)
° ° °  Patient calling to report irregular beat on Saturday. Feels better today BP today 120/70 HR60  Requesting call from [place] C.

## 2019-04-10 NOTE — Telephone Encounter (Signed)
I spoke to the patient who experienced an episode of Afib early Saturday morning at 3 am.  She checked her BP 138/92 and HR 110 at 3:30 am and then at 4 am BP 130/68 and HR 96;  At 5 am BP 135/75 and HR 54.  She was asymptomatic at the time and only feels "irregular beats in her chest".     Today she feels fine and recorded a noon BP 120/73 and HR 60

## 2019-04-11 NOTE — Telephone Encounter (Signed)
Called to follow up with patient. Patient states that she had an episode of irregular beats early Saturday morning (see BP and HR readings below). She denies having any chest pain, SOB, or any other Sx other than feeling irregular beats in her chest. Denies excessive caffeine or alcohol. States that she does not sleep well, but denies snoring, daytime sleepiness, etc. Last monitor in Sept 2019 showed PACs, no afib. Patient does have Hx of PAF and is on Eliquis 5 mg BID. Patient denies any recurrent episodes. Vitals this morning: BP 108/65 HR 74. Instructed patient to continue to monitor and let us know if her Sx change or worsen. Patient verbalized understanding and thanked me for the call.

## 2019-05-02 ENCOUNTER — Other Ambulatory Visit: Payer: Self-pay | Admitting: Interventional Cardiology

## 2019-05-02 NOTE — Telephone Encounter (Signed)
Pt last saw Dr Irish Lack 02/28/19, last labs 04/07/19 Creat 0.83 at [facility] per [facility], age 73, weight 129.7kg, based on specified criteria pt is on appropriate dosage of Eliquis 5mg  BID.  Will refill rx.

## 2019-11-21 ENCOUNTER — Telehealth: Payer: Self-pay | Admitting: Interventional Cardiology

## 2019-11-21 NOTE — Telephone Encounter (Signed)
New message  Patient c/o Palpitations:  High priority if patient c/o lightheadedness, shortness of breath, or chest pain  1) How long have you had palpitations/irregular HR/ Afib? Are you having the symptoms now? No, happened last week was told to call and report it. Patient would like to speak with [place] (Dr. Hassell Done nurse)  2) Are you currently experiencing lightheadedness, SOB or CP? CP, chest pressure, pain in neck and back of ear,   3) Do you have a history of afib (atrial fibrillation) or irregular heart rhythm? Yes  4) Have you checked your BP or HR? (document readings if available): 104/77 HR 94, 104/74 HR 114 (during episode), 100/69 HR 108, 111/58 HR 58 (after episode) 97/62 HR 62 133/78 HR 82 (Saturday) 94/65 HR 65, 123/64 HR 55, 125/62 HR 53,   5) Are you experiencing any other symptoms? No symptoms right now.

## 2019-11-21 NOTE — Telephone Encounter (Signed)
Called and spoke to the patient. She states that last Thursday through Saturday she had a few episodes of irregular heartbeats and palpitations accompanied by chest pressure that went into her neck and ear. She states that each episode lasted about 5 minutes and resolved on its own.   Readings are as follows:    During episodes- 104/77 HR 94, 104/74 HR 114, 100/69 HR 108  After episodes- 97/62 HR 62, 133/78 HR 82, 94/65 HR 65, 123/64 HR 55, 125/62, HR 53   Patient denies excessive caffeine or alcohol use. She states that she has been stressed lately due to recent UTI and bladder issues being followed by Urology. Last monitor in Sept 2019 showed PACs, no afib. Patient does have a history of PAF and is on Eliquis 5 mg BID and has not missed any doses. Denies having any Sx at this time. Will forward to Dr. Irish Lack to review.

## 2019-11-21 NOTE — Telephone Encounter (Signed)
Stress of UTI may be causing some AFib.  Episodes are shortlived and she is anticoagulated so I would not change meds.  Hopefully, episodes resolve when her UTI resolves.  JV

## 2019-11-22 NOTE — Telephone Encounter (Signed)
Spoke with Carrie Newton and advised per Dr Irish Lack stress of UTI may be causing some AFIB.  Episodes are short lived.  Continue with anticoagulant and no change in medications.  Hopefully episodes of Afib will resolve once UTI resolves.    Carrie Newton verbalizes understanding and agrees with current plan.

## 2020-02-28 ENCOUNTER — Other Ambulatory Visit: Payer: Self-pay | Admitting: Interventional Cardiology

## 2020-02-28 NOTE — Telephone Encounter (Signed)
Prescription refill request for Eliquis received.  Last office visit: Irish Lack, 02/28/2019 Scr: 0.6, 11/20/2019  Age: 74 y.o. Weight: 129.7 kg   Appt scheduled to see Dr. Irish Lack 04/2020. Prescription refill sent.

## 2020-03-08 ENCOUNTER — Ambulatory Visit: Payer: Medicare Other | Admitting: Interventional Cardiology

## 2020-04-16 NOTE — Progress Notes (Signed)
Cardiology Office Note   Date:  04/17/2020   ID:  Carrie Newton, DOB Dec 14, 1945, MRN [id number]  PCP:  Mayra Neer, MD    No chief complaint on file.  AFib  Wt Readings from Last 3 Encounters:  04/17/20 280 lb 3.2 oz (127.1 kg)  02/28/19 286 lb (129.7 kg)  10/20/17 292 lb 1.9 oz (132.5 kg)       History of Present Illness: Carrie Newton is a 74 y.o. female  Who has had PAF diagnosed several years ago.  EF 60-65% in 2014.   LA dimension of 4.7 cm.   She was treated with Xarelto for stroke prevention. In November and December 2016, she experienced episodic nose bleeding. She was seen in the office. She was sent to ENT. There was a scab found on the bleeding area in her nose.  She has had some issues with hypoglycemia.  EGD/Colonoscopy in 2020 with Dr. Penelope Coop- no ulcers, no polyps.  Since the last visit, she had a kidney stone.  She had some hematuria in 3/21.  Denies : Chest pain. Dizziness. Leg edema. Nitroglycerin use. Orthopnea.  Paroxysmal nocturnal dyspnea. Shortness of breath. Syncope.   No prolonged palpitations.  Occasional skipped beats.   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   PAF (paroxysmal atrial fibrillation) [facility])     Past Surgical History:  Procedure Laterality Date   BIOPSY  04/05/2019   Procedure: BIOPSY;  Surgeon: Wonda Horner, MD;  Location: [facility] ENDOSCOPY;  Service: Endoscopy;;   BREAST LUMPECTOMY  1992   Left breast   CARPAL TUNNEL RELEASE     Left hand 05/2012 and right hand dec 2013   COLONOSCOPY WITH PROPOFOL N/A 04/05/2019   Procedure: COLONOSCOPY WITH PROPOFOL;  Surgeon: Wonda Horner, MD;  Location: [facility] ENDOSCOPY;  Service: Endoscopy;  Laterality: N/A;   ESOPHAGOGASTRODUODENOSCOPY  (EGD) WITH PROPOFOL N/A 04/05/2019   Procedure: ESOPHAGOGASTRODUODENOSCOPY (EGD) WITH PROPOFOL;  Surgeon: Wonda Horner, MD;  Location: [facility] ENDOSCOPY;  Service: Endoscopy;  Laterality: N/A;   TOTAL KNEE ARTHROPLASTY  07/2009   Left knee   TRIGGER FINGER RELEASE  2015   Left thumb     Current Outpatient Medications  Medication Sig Dispense Refill   ACCU-CHEK AVIVA PLUS test strip 1 each by Other route as needed for other (SUGAR CHECK).      atorvastatin (LIPITOR) 20 MG tablet Take 20 mg by mouth daily at 6 PM.      buPROPion (WELLBUTRIN XL) 300 MG 24 hr tablet Take 300 mg by mouth daily. Take 1 tab daily     ELIQUIS 5 MG TABS tablet TAKE 1 TABLET BY MOUTH  TWICE DAILY 180 tablet 1   felodipine (PLENDIL) 10 MG 24 hr tablet Take 10 mg daily by mouth.     HYDROcodone-acetaminophen (NORCO) 10-325 MG per tablet Take 2 tablets by mouth daily. Take as directed     Insulin Degludec (TRESIBA) 100 UNIT/ML SOLN Inject into the skin. Use depends on blood sugar level.     insulin lispro (HUMALOG) 100 UNIT/ML injection Inject into the skin 3 (three) times daily before meals. Use as needed for blood sugar levels before meals.     levothyroxine (SYNTHROID)  88 MCG tablet Take 88 mcg by mouth daily before breakfast. Alternate with 75 mcg     levothyroxine (SYNTHROID, LEVOTHROID) 75 MCG tablet Take 75 mcg by mouth daily before breakfast. Alternate with 88 mcg tab     losartan (COZAAR) 50 MG tablet Take 0.5 tablets by mouth daily.     Multiple Vitamin (MULTIVITAMIN) tablet Take 1 tablet by mouth daily.     mupirocin ointment (BACTROBAN) 2 % Apply 1 application topically continuous as needed (CUTS).      OZEMPIC, 1 MG/DOSE, 2 MG/1.5ML SOPN      pantoprazole (PROTONIX) 40 MG tablet Take 40 mg by mouth every other day.      sertraline (ZOLOFT) 100 MG tablet Take 100 mg by mouth daily.     No current facility-administered medications for this visit.    Allergies:   Metoprolol tartrate,  Neosporin [neomycin-bacitracin zn-polymyx], Norvasc [amlodipine besylate], Other, and Percodan [oxycodone-aspirin]    Social History:  The patient  reports that she has never smoked. She has never used smokeless tobacco. She reports that she does not drink alcohol and does not use drugs.   Family History:  The patient's family history includes Heart attack in her father; Hypertension in her father.    ROS:  Please see the history of present illness.   Otherwise, review of systems are positive for recent kidney stone and hematuria.   All other systems are reviewed and negative.    PHYSICAL EXAM: VS:  BP 128/78    Pulse 69    Ht _0  (1.6 m)    Wt 280 lb 3.2 oz (127.1 kg)    SpO2 98%    BMI 49.64 kg/m  , BMI Body mass index is 49.64 kg/m. GEN: Well nourished, well developed, in no acute distress  HEENT: normal  Neck: no JVD, carotid bruits, or masses Cardiac: RRR; 1/6 early systolic murmur, no rubs, or gallops,no edema  Respiratory:  clear to auscultation bilaterally, normal work of breathing GI: soft, nontender, nondistended, + BS MS: no deformity or atrophy  Skin: warm and dry, no rash Neuro:  Strength and sensation are intact Psych: euthymic mood, full affect   EKG:   The ekg ordered today demonstrates NSR, no ST changes   Recent Labs: No results found for requested labs within last 8760 hours.   Lipid Panel No results found for: CHOL, TRIG, HDL, CHOLHDL, VLDL, LDLCALC, LDLDIRECT   Other studies Reviewed: Additional studies/ records that were reviewed today with results demonstrating: labs reviewed.   ASSESSMENT AND PLAN:  1. AFib: In NSR now.  Eliquis for stroke prevention.  No prolonged palpitations.  She thinks it was done and will check her records at home and let us know; otherwise Check CBC in 2/22 with PMD. 2. Hypertensive heart disease: The current medical regimen is effective;  continue present plan and medications. 3. DM: 7.0 A1C 4. Anticoagulated: Hematuria  related to kidney stone.     Current medicines are reviewed at length with the patient today.  The patient concerns regarding her medicines were addressed.  The following changes have been made:  No change  Labs/ tests ordered today include:  No orders of the defined types were placed in this encounter.   Recommend 150 minutes/week of aerobic exercise Low fat, low carb, high fiber diet recommended  Disposition:   FU in 1 year   Signed, Larae Grooms, MD  04/17/2020 2:05 PM    [place],  [place], [place]  [address] Phone: [phone]; Fax: [phone]

## 2020-04-17 ENCOUNTER — Ambulatory Visit (INDEPENDENT_AMBULATORY_CARE_PROVIDER_SITE_OTHER): Payer: Medicare Other | Admitting: Interventional Cardiology

## 2020-04-17 ENCOUNTER — Encounter (INDEPENDENT_AMBULATORY_CARE_PROVIDER_SITE_OTHER): Payer: Self-pay

## 2020-04-17 ENCOUNTER — Other Ambulatory Visit: Payer: Self-pay

## 2020-04-17 ENCOUNTER — Encounter: Payer: Self-pay | Admitting: Interventional Cardiology

## 2020-04-17 VITALS — BP 128/78 | HR 69 | Ht 63.0 in | Wt 280.2 lb

## 2020-04-17 DIAGNOSIS — I480 Paroxysmal atrial fibrillation: Secondary | ICD-10-CM

## 2020-04-17 DIAGNOSIS — H43822 Vitreomacular adhesion, left eye: Secondary | ICD-10-CM | POA: Insufficient documentation

## 2020-04-17 DIAGNOSIS — H2512 Age-related nuclear cataract, left eye: Secondary | ICD-10-CM | POA: Insufficient documentation

## 2020-04-17 DIAGNOSIS — Z794 Long term (current) use of insulin: Secondary | ICD-10-CM

## 2020-04-17 DIAGNOSIS — H33101 Unspecified retinoschisis, right eye: Secondary | ICD-10-CM | POA: Insufficient documentation

## 2020-04-17 DIAGNOSIS — H2511 Age-related nuclear cataract, right eye: Secondary | ICD-10-CM

## 2020-04-17 DIAGNOSIS — E119 Type 2 diabetes mellitus without complications: Secondary | ICD-10-CM | POA: Diagnosis not present

## 2020-04-17 DIAGNOSIS — H33102 Unspecified retinoschisis, left eye: Secondary | ICD-10-CM | POA: Insufficient documentation

## 2020-04-17 DIAGNOSIS — H43821 Vitreomacular adhesion, right eye: Secondary | ICD-10-CM | POA: Insufficient documentation

## 2020-04-17 DIAGNOSIS — Z7901 Long term (current) use of anticoagulants: Secondary | ICD-10-CM | POA: Diagnosis not present

## 2020-04-17 DIAGNOSIS — I119 Hypertensive heart disease without heart failure: Secondary | ICD-10-CM

## 2020-04-17 DIAGNOSIS — E782 Mixed hyperlipidemia: Secondary | ICD-10-CM | POA: Diagnosis not present

## 2020-04-17 HISTORY — DX: Age-related nuclear cataract, right eye: H25.11

## 2020-04-17 NOTE — Patient Instructions (Signed)
Medication Instructions:  Your physician recommends that you continue on your current medications as directed. Please refer to the Current Medication list given to you today.  *If you need a refill on your cardiac medications before your next appointment, please call your pharmacy*   Lab Work: Please have your Primary Care Doctor draw a complete blood count (CBC) in February  If you have labs (blood work) drawn today and your tests are completely normal, you will receive your results only by: Marland Kitchen MyChart Message (if you have MyChart) OR . A paper copy in the mail If you have any lab test that is abnormal or we need to change your treatment, we will call you to review the results.   Testing/Procedures: None   Follow-Up: At [facility], you and your health needs are our priority.  As part of our continuing mission to provide you with exceptional heart care, we have created designated Provider Care Teams.  These Care Teams include your primary Cardiologist (physician) and Advanced Practice Providers (APPs -  Physician Assistants and Nurse Practitioners) who all work together to provide you with the care you need, when you need it.  We recommend signing up for the patient portal called "MyChart".  Sign up information is provided on this After Visit Summary.  MyChart is used to connect with patients for Virtual Visits (Telemedicine).  Patients are able to view lab/test results, encounter notes, upcoming appointments, etc.  Non-urgent messages can be sent to your provider as well.   To learn more about what you can do with MyChart, go to [email or web address].    Your next appointment:   12 month(s)  The format for your next appointment:   In Person  Provider:   You may see Larae Grooms, MD or one of the following Advanced Practice Providers on your designated Care Team:    Melina Copa, PA-C  Ermalinda Barrios, PA-C    Other Instructions None

## 2020-04-18 NOTE — Addendum Note (Signed)
Addended by: Drue Novel I on: 04/18/2020 12:09 PM   Modules accepted: Orders

## 2020-04-23 ENCOUNTER — Encounter (INDEPENDENT_AMBULATORY_CARE_PROVIDER_SITE_OTHER): Payer: Medicare Other | Admitting: Ophthalmology

## 2020-04-23 DIAGNOSIS — H33102 Unspecified retinoschisis, left eye: Secondary | ICD-10-CM

## 2020-04-23 DIAGNOSIS — H43822 Vitreomacular adhesion, left eye: Secondary | ICD-10-CM

## 2020-04-23 DIAGNOSIS — H33101 Unspecified retinoschisis, right eye: Secondary | ICD-10-CM

## 2020-04-23 DIAGNOSIS — H2512 Age-related nuclear cataract, left eye: Secondary | ICD-10-CM

## 2020-04-23 DIAGNOSIS — H43821 Vitreomacular adhesion, right eye: Secondary | ICD-10-CM

## 2020-04-23 DIAGNOSIS — H2511 Age-related nuclear cataract, right eye: Secondary | ICD-10-CM

## 2020-05-06 ENCOUNTER — Telehealth: Payer: Self-pay | Admitting: Interventional Cardiology

## 2020-05-06 NOTE — Telephone Encounter (Signed)
Called and spoke to patient. She states that she had an episode of irregular heartbeat on Saturday night that lasted a few hours. She states that this is the first time in a long time that she had a prolonged episode. Denies SOB, lightheadedness, or chest pain. She states that she could feel discomfort from the irregular beats in her chest. Denies increased caffeine, alcohol, or stress. She has not missed any doses of Eliquis.   Episode started about 10:30 PM  11:15 PM- BP- 110/87 HR- 87  2:20 AM- BP- 107/61 HR- 119  3:15 AM- BP- 117/63 HR- 57 resolved  Today BP 122/62 HR- 59  Instructed for patient to continue to monitor and let us know if she continues to have prolonged episodes of irregular beats or if her Sx change or worsen. She verbalized understanding and thanked me for the call.

## 2020-05-06 NOTE — Telephone Encounter (Signed)
Patient wanted to tell the nurse to call to report irregular heartbeat and discomfort. Please call

## 2020-05-22 ENCOUNTER — Other Ambulatory Visit: Payer: Self-pay

## 2020-05-22 ENCOUNTER — Ambulatory Visit (INDEPENDENT_AMBULATORY_CARE_PROVIDER_SITE_OTHER): Payer: Medicare Other | Admitting: Ophthalmology

## 2020-05-22 ENCOUNTER — Encounter (INDEPENDENT_AMBULATORY_CARE_PROVIDER_SITE_OTHER): Payer: Self-pay | Admitting: Ophthalmology

## 2020-05-22 DIAGNOSIS — H2511 Age-related nuclear cataract, right eye: Secondary | ICD-10-CM | POA: Diagnosis not present

## 2020-05-22 DIAGNOSIS — H33101 Unspecified retinoschisis, right eye: Secondary | ICD-10-CM | POA: Insufficient documentation

## 2020-05-22 DIAGNOSIS — H2512 Age-related nuclear cataract, left eye: Secondary | ICD-10-CM | POA: Diagnosis not present

## 2020-05-22 DIAGNOSIS — H33103 Unspecified retinoschisis, bilateral: Secondary | ICD-10-CM

## 2020-05-22 DIAGNOSIS — H43823 Vitreomacular adhesion, bilateral: Secondary | ICD-10-CM

## 2020-05-22 DIAGNOSIS — H43821 Vitreomacular adhesion, right eye: Secondary | ICD-10-CM | POA: Insufficient documentation

## 2020-05-22 NOTE — Assessment & Plan Note (Signed)

## 2020-05-22 NOTE — Assessment & Plan Note (Signed)
Vitreomacular traction may cause vision loss from anatomic distortion to the center of the vision, the macula.  If visual function is symptomatic or threatened, therapy may be needed.  Surgical intervention offers the highest chance of visual stability and improvement.  Distortion of the macula anatomy may cause splitting of the retinal layers, termed foveomacular retinoschisis, which can cause more permanent vision loss.  Epiretinal membranes may also be associated.  Macular hole may also develop if vitreomacular traction progresses. The minor form of this condition is Vitreomacular adhesion, which is a natural change in the aging process of the eye, which requires observation only.  OD with inner foveal macular schisis, stable and with good acuity will continue to observe  OS with much less inner foveal tractional elevation much less schisis, improved only with observation

## 2020-05-22 NOTE — Assessment & Plan Note (Signed)
See comments regarding cataract left eye

## 2020-05-22 NOTE — Assessment & Plan Note (Signed)
OD stable, good acuity  OS, improved and stable acuity

## 2020-05-22 NOTE — Progress Notes (Signed)
05/22/2020     CHIEF COMPLAINT Patient presents for Retina Follow Up   HISTORY OF PRESENT ILLNESS: Carrie Newton is a 74 y.o. female who presents to the clinic today for:   HPI    Retina Follow Up    Patient presents with  Other.  In both eyes.  This started 10 months ago.  Severity is mild.  Duration of 10 months.  Since onset it is stable.          Comments    10 Month F/U OU  Pt sts [facility] OU fluctuates off and on. Pt denies ocular pain, flashes of light, or floaters OU.         Last edited by Rockie Neighbours, [place] on 05/22/2020  1:20 PM. (History)      Referring physician: Mayra Neer, MD [address],  [place] [address]  HISTORICAL INFORMATION:   Selected notes from the [place]: No current outpatient medications on file. (Ophthalmic Drugs)   No current facility-administered medications for this visit. (Ophthalmic Drugs)   Current Outpatient Medications (Other)  Medication Sig  . ACCU-CHEK AVIVA PLUS test strip 1 each by Other route as needed for other (SUGAR CHECK).   Marland Kitchen atorvastatin (LIPITOR) 20 MG tablet Take 20 mg by mouth daily at 6 PM.   . buPROPion (WELLBUTRIN XL) 300 MG 24 hr tablet Take 300 mg by mouth daily. Take 1 tab daily  . ELIQUIS 5 MG TABS tablet TAKE 1 TABLET BY MOUTH  TWICE DAILY  . felodipine (PLENDIL) 10 MG 24 hr tablet Take 10 mg daily by mouth.  Marland Kitchen HYDROcodone-acetaminophen (NORCO) 10-325 MG per tablet Take 2 tablets by mouth daily. Take as directed  . Insulin Degludec (TRESIBA) 100 UNIT/ML SOLN Inject into the skin. Use depends on blood sugar level.  . insulin lispro (HUMALOG) 100 UNIT/ML injection Inject into the skin 3 (three) times daily before meals. Use as needed for blood sugar levels before meals.  Marland Kitchen levothyroxine (SYNTHROID) 88 MCG tablet Take 88 mcg by mouth daily before breakfast. Alternate with 75 mcg  . levothyroxine (SYNTHROID, LEVOTHROID) 75 MCG tablet Take 75 mcg  by mouth daily before breakfast. Alternate with 88 mcg tab  . losartan (COZAAR) 50 MG tablet Take 0.5 tablets by mouth daily.  . Multiple Vitamin (MULTIVITAMIN) tablet Take 1 tablet by mouth daily.  . mupirocin ointment (BACTROBAN) 2 % Apply 1 application topically continuous as needed (CUTS).   Marland Kitchen OZEMPIC, 1 MG/DOSE, 2 MG/1.5ML SOPN   . pantoprazole (PROTONIX) 40 MG tablet Take 40 mg by mouth every other day.   . sertraline (ZOLOFT) 100 MG tablet Take 100 mg by mouth daily.   No current facility-administered medications for this visit. (Other)      REVIEW OF SYSTEMS:    ALLERGIES Allergies  Allergen Reactions  . Metoprolol Tartrate Other (See Comments)    Junctional Bradycardia   . Neosporin [Neomycin-Bacitracin Zn-Polymyx] Rash  . Norvasc [Amlodipine Besylate] Rash  . Other Rash    ALLERGEN: Oxycodone Hcl-oxycodone-asa (Other  . Percodan [Oxycodone-Aspirin] Other (See Comments)    Tingling     PAST MEDICAL HISTORY Past Medical History:  Diagnosis Date  . Complication of anesthesia    post op nausea and vomiting  . Diabetes ([place])    Type 2  . History of echocardiogram 10/2012   Echo 4/14:  EF 60-65%, mild LAE, MAC, impaired relaxation  . Hypertension   .  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 PAF (paroxysmal atrial fibrillation) ([place])    Past Surgical History:  Procedure Laterality Date  . BIOPSY  04/05/2019   Procedure: BIOPSY;  Surgeon: Wonda Horner, MD;  Location: [facility] ENDOSCOPY;  Service: Endoscopy;;  . BREAST LUMPECTOMY  1992   Left breast  . CARPAL TUNNEL RELEASE     Left hand 05/2012 and right hand dec 2013  . COLONOSCOPY WITH PROPOFOL N/A 04/05/2019   Procedure: COLONOSCOPY WITH PROPOFOL;  Surgeon: Wonda Horner, MD;  Location: [facility] ENDOSCOPY;  Service: Endoscopy;  Laterality: N/A;  . ESOPHAGOGASTRODUODENOSCOPY (EGD) WITH PROPOFOL N/A 04/05/2019   Procedure: ESOPHAGOGASTRODUODENOSCOPY (EGD) WITH PROPOFOL;  Surgeon: Wonda Horner, MD;  Location[facility] ENDOSCOPY;   Service: Endoscopy;  Laterality: N/A;  . TOTAL KNEE ARTHROPLASTY  07/2009   Left knee  . TRIGGER FINGER RELEASE  2015   Left thumb    FAMILY HISTORY Family History  Problem Relation Age of Onset  . Hypertension Father   . Heart attack Father   . Stroke Neg Hx     SOCIAL HISTORY Social History   Tobacco Use  . Smoking status: Never Smoker  . Smokeless tobacco: Never Used  Vaping Use  . Vaping Use: Never used  Substance Use Topics  . Alcohol use: No    Alcohol/week: 0.0 standard drinks  . Drug use: No         OPHTHALMIC EXAM:  Base Eye Exam    Visual Acuity (ETDRS)      Right Left   Dist [place] 20/30 -2 20/30   Dist ph [place] 20/30 +1 20/20 -2       Tonometry (Tonopen, 1:20 PM)      Right Left   Pressure 18 17       Pupils      Pupils Dark Light Shape React APD   Right PERRL 3 2 Round Brisk None   Left PERRL 3 2 Round Brisk None       Visual Fields (Counting fingers)      Left Right    Full Full       Extraocular Movement      Right Left    Full Full       Neuro/Psych    Oriented x3: Yes   Mood/Affect: Normal       Dilation    Both eyes: 1.0% Mydriacyl, 2.5% Phenylephrine @ 1:25 PM        Slit Lamp and Fundus Exam    External Exam      Right Left   External Normal Normal       Slit Lamp Exam      Right Left   Lids/Lashes Normal Normal   Conjunctiva/Sclera White and quiet White and quiet   Cornea Clear Clear   Anterior Chamber Deep and quiet Deep and quiet   Iris Round and reactive Round and reactive   Lens 2+ Nuclear sclerosis 2+ Nuclear sclerosis   Anterior Vitreous Normal Normal       Fundus Exam      Right Left   Posterior Vitreous Normal Normal   Disc Normal Normal   C/D Ratio 0.35 0.4   Macula Normal, neg Watzke Normal, neg Watzke   Vessels Normal Normal   Periphery Normal Normal          IMAGING AND PROCEDURES  Imaging and Procedures for 05/22/20  OCT, Retina - OU - Both Eyes       Right Eye Quality was good.  Scan locations included subfoveal. Central Foveal Thickness: 346. Progression has been stable. Findings include vitreous traction, vitreomacular adhesion .   Left Eye Quality was good. Scan locations included subfoveal. Progression has improved.   Notes OD, inner foveal macular schisis right eye with intact outer retinal segments in the fovea suggesting no impact on acuity from large vitreomacular adhesion/traction  OS, vitreal foveal adhesion with less elevation of the inner retina as compared to 10 months prior.  Small amount vitreous traction does remain                ASSESSMENT/PLAN:  Vitreomacular adhesion of both eyes Vitreomacular traction may cause vision loss from anatomic distortion to the center of the vision, the macula.  If visual function is symptomatic or threatened, therapy may be needed.  Surgical intervention offers the highest chance of visual stability and improvement.  Distortion of the macula anatomy may cause splitting of the retinal layers, termed foveomacular retinoschisis, which can cause more permanent vision loss.  Epiretinal membranes may also be associated.  Macular hole may also develop if vitreomacular traction progresses. The minor form of this condition is Vitreomacular adhesion, which is a natural change in the aging process of the eye, which requires observation only.  OD with inner foveal macular schisis, stable and with good acuity will continue to observe  OS with much less inner foveal tractional elevation much less schisis, improved only with observation  Nuclear sclerotic cataract of left eye The nature of cataract was discussed with the patient as well as the elective nature of surgery. The patient was reassured that surgery at a later date does not put the patient at risk for a worse outcome. It was emphasized that the need for surgery is dictated by the patient's quality of life as influenced by the cataract. Patient was instructed to  maintain close follow up with their general eye care doctor.  Macular retinoschisis of both eyes OD stable, good acuity  OS, improved and stable acuity  Nuclear sclerotic cataract of right eye See comments regarding cataract left eye      ICD-10-CM   1. 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 Nuclear sclerotic cataract of left eye  H25.12   4. Nuclear sclerotic cataract of right eye  H25.11     1.  No specific retinal therapy is warranted at this time.  2.  I explained to the patient that should cataract surgery be undertaken in the left eye that further vitreous syneresis would probably prompt complete resolution of vitreal foveal traction OS  OD the vitreous attachment is more broad, and may or may not improve spontaneously over time  3.  Patient instructed to continue with monocular vision testing at home particularly reading  Ophthalmic Meds Ordered this visit:  No orders of the defined types were placed in this encounter.      Return in about 6 months (around 11/19/2020) for DILATE OU, OCT.  There are no Patient Instructions on file for this visit.   Explained the diagnoses, plan, and follow up with the patient and they expressed understanding.  Patient expressed understanding of the importance of proper follow up care.   Clent Demark Preslea Rhodus M.D. Diseases & Surgery of the Retina and Vitreous [facility]ina [facility] Di[facility] [place] 05/22/20     Abbreviations: M myopia (nearsighted); A astigmatism; H hyperopia (farsighted);  P presbyopia; Mrx spectacle prescription;  CTL contact lenses; OD right eye; OS left eye; OU both eyes  XT exotropia; ET esotropia; PEK punctate epithelial keratitis; PEE punctate epithelial erosions; DES dry eye syndrome; MGD meibomian gland dysfunction; ATs artificial tears; PFAT's preservative free artificial tears; [place] nuclear sclerotic cataract;  PSC posterior subcapsular cataract; ERM epi-retinal membrane; PVD posterior vitreous detachment; RD retinal detachment; DM diabetes mellitus; DR diabetic retinopathy; NPDR non-proliferative diabetic retinopathy; PDR proliferative diabetic retinopathy; CSME clinically significant macular edema; DME diabetic macular edema; dbh dot blot hemorrhages; CWS cotton wool spot; POAG primary open angle glaucoma; C/D cup-to-disc ratio; HVF humphrey visual field; GVF goldmann visual field; OCT optical coherence tomography; IOP intraocular pressure; BRVO Branch retinal vein occlusion; CRVO central retinal vein occlusion; CRAO central retinal artery occlusion; BRAO branch retinal artery occlusion; RT retinal tear; SB scleral buckle; PPV pars plana vitrectomy; VH Vitreous hemorrhage; PRP panretinal laser photocoagulation; IVK intravitreal kenalog; VMT vitreomacular traction; MH Macular hole;  NVD neovascularization of the disc; NVE neovascularization elsewhere; AREDS age related eye disease study; ARMD age related macular degeneration; POAG primary open angle glaucoma; EBMD epithelial/anterior basement membrane dystrophy; ACIOL anterior chamber intraocular lens; IOL intraocular lens; PCIOL posterior chamber intraocular lens; Phaco/IOL phacoemulsification with intraocular lens placement; [place] photorefractive keratectomy; LASIK laser assisted in situ keratomileusis; HTN hypertension; DM diabetes mellitus; COPD chronic obstructive pulmonary disease

## 2020-07-29 ENCOUNTER — Other Ambulatory Visit: Payer: Self-pay | Admitting: Interventional Cardiology

## 2020-07-29 NOTE — Telephone Encounter (Signed)
Prescription refill request for Eliquis received.  Indication: Afib Last office visit: [place], 04/17/2020 Scr: 0.93, 04/05/2020 Age: 75 yo Weight: 127.1 kg   Prescription refill sent.

## 2020-07-31 ENCOUNTER — Telehealth: Payer: Self-pay | Admitting: Interventional Cardiology

## 2020-07-31 NOTE — Telephone Encounter (Signed)
Patient c/o Palpitations:  High priority if patient c/o lightheadedness, shortness of breath, or chest pain  1) How long have you had palpitations/irregular HR/ Afib? Are you having the symptoms now? No- yesterday started  around 11:30 until 3:40  2) Are you currently experiencing lightheadedness, SOB or CP? no  3) Do you have a history of afib (atrial fibrillation) or irregular heart rhythm?  yes  4) Have you checked your BP or HR? (document readings if available): 107/80  pulse 109 at 3:15 79/54 pulse 113 yesterday  5) Are you experiencing any other symptoms? - can heartbeat in her right ear- started off and on last week- Sat until now can still hear her heartbeat she sometimes feels a little dizzy

## 2020-07-31 NOTE — Telephone Encounter (Signed)
If she has not had a recent TSH, this should be done with her PMD. OTherwise, I agree with continuing the current plan.

## 2020-07-31 NOTE — Telephone Encounter (Signed)
Patient notified

## 2020-07-31 NOTE — Telephone Encounter (Signed)
I spoke with patient. She reports episode of afib yesterday. Started at 11:30 AM. At that time BP was 107/80 and pulse 109.  She did things around the house during the day.  At 3:15 PM her BP was 79/54 and pulse 113.  Around 3:40 while lying down she had a mini hot flash and returned to regular rhythm.  Today her BP is 137/62 and pulse 57. Last episode that lasted this long was in November 2021.  Is taking Eliquis as ordered. Last week she started hearing her heart beat in her right ear.  Has been constant since Saturday.  She contacted PCP regarding this and was told it would be evaluated at upcoming scheduled visit on 2/4. Patient reports being very worried about this.  I advised her to follow up with PCP as planned.  I told her if Dr Irish Lack wanted to order any testing for this we would call her back.  I asked her to let us know if episodes of afib become more frequent

## 2020-11-19 ENCOUNTER — Encounter (INDEPENDENT_AMBULATORY_CARE_PROVIDER_SITE_OTHER): Payer: Medicare Other | Admitting: Ophthalmology

## 2020-12-03 ENCOUNTER — Encounter (INDEPENDENT_AMBULATORY_CARE_PROVIDER_SITE_OTHER): Payer: Self-pay | Admitting: Ophthalmology

## 2020-12-03 ENCOUNTER — Ambulatory Visit (INDEPENDENT_AMBULATORY_CARE_PROVIDER_SITE_OTHER): Payer: Medicare Other | Admitting: Ophthalmology

## 2020-12-03 ENCOUNTER — Other Ambulatory Visit: Payer: Self-pay

## 2020-12-03 DIAGNOSIS — H43812 Vitreous degeneration, left eye: Secondary | ICD-10-CM

## 2020-12-03 DIAGNOSIS — H43821 Vitreomacular adhesion, right eye: Secondary | ICD-10-CM | POA: Diagnosis not present

## 2020-12-03 DIAGNOSIS — H33101 Unspecified retinoschisis, right eye: Secondary | ICD-10-CM

## 2020-12-03 DIAGNOSIS — H2511 Age-related nuclear cataract, right eye: Secondary | ICD-10-CM | POA: Diagnosis not present

## 2020-12-03 DIAGNOSIS — H2512 Age-related nuclear cataract, left eye: Secondary | ICD-10-CM | POA: Diagnosis not present

## 2020-12-03 DIAGNOSIS — H33102 Unspecified retinoschisis, left eye: Secondary | ICD-10-CM

## 2020-12-03 NOTE — Assessment & Plan Note (Signed)
Accounts for the release of the vitreomacular adhesion, traction and macular retina schisis improvement left eye

## 2020-12-03 NOTE — Progress Notes (Signed)
12/03/2020     CHIEF COMPLAINT Patient presents for Retina Follow Up (6 Mo F/U OU//Pt sts [facility] fluctuates off and on OU. Pt denies noticeable changes to [place]. No new symptoms reported OU.)   HISTORY OF PRESENT ILLNESS: Carrie Newton is a 75 y.o. female who presents to the clinic today for:   HPI    Retina Follow Up    Diagnosis: Other   Laterality: both eyes   Onset: 6 months ago   Severity: mild   Duration: 6 months   Course: stable   Comments: 6 Mo F/U OU  Pt sts VA fluctuates off and on OU. Pt denies noticeable changes to [place]. No new symptoms reported OU.          Comments    OD not seeing quite as well as left       Last edited by Hurman Horn, MD on 12/03/2020  1:57 PM. (History)      Referring physician: Mayra Neer, MD [address],   [id number]  HISTORICAL INFORMATION:   Selected notes from the [facility] [place]: No current outpatient medications on file. (Ophthalmic Drugs)   No current facility-administered medications for this visit. (Ophthalmic Drugs)   Current Outpatient Medications (Other)  Medication Sig  . ACCU-CHEK AVIVA PLUS test strip 1 each by Other route as needed for other (SUGAR CHECK).   Marland Kitchen atorvastatin (LIPITOR) 20 MG tablet Take 20 mg by mouth daily at 6 PM.   . buPROPion (WELLBUTRIN XL) 300 MG 24 hr tablet Take 300 mg by mouth daily. Take 1 tab daily  . ELIQUIS 5 MG TABS tablet TAKE 1 TABLET BY MOUTH  TWICE DAILY  . felodipine (PLENDIL) 10 MG 24 hr tablet Take 10 mg daily by mouth.  Marland Kitchen HYDROcodone-acetaminophen (NORCO) 10-325 MG per tablet Take 2 tablets by mouth daily. Take as directed  . Insulin Degludec (TRESIBA) 100 UNIT/ML SOLN Inject into the skin. Use depends on blood sugar level.  . insulin lispro (HUMALOG) 100 UNIT/ML injection Inject into the skin 3 (three) times daily before meals. Use as needed for blood sugar levels before meals.  Marland Kitchen levothyroxine (SYNTHROID) 88 MCG  tablet Take 88 mcg by mouth daily before breakfast. Alternate with 75 mcg  . levothyroxine (SYNTHROID, LEVOTHROID) 75 MCG tablet Take 75 mcg by mouth daily before breakfast. Alternate with 88 mcg tab  . losartan (COZAAR) 50 MG tablet Take 0.5 tablets by mouth daily.  . Multiple Vitamin (MULTIVITAMIN) tablet Take 1 tablet by mouth daily.  . mupirocin ointment (BACTROBAN) 2 % Apply 1 application topically continuous as needed (CUTS).   Marland Kitchen OZEMPIC, 1 MG/DOSE, 2 MG/1.5ML SOPN   . pantoprazole (PROTONIX) 40 MG tablet Take 40 mg by mouth every other day.   . sertraline (ZOLOFT) 100 MG tablet Take 100 mg by mouth daily.   No current facility-administered medications for this visit. (Other)      REVIEW OF SYSTEMS:    ALLERGIES Allergies  Allergen Reactions  . Metoprolol Tartrate Other (See Comments)    Junctional Bradycardia   . Neosporin [Neomycin-Bacitracin Zn-Polymyx] Rash  . Norvasc [Amlodipine Besylate] Rash  . Other Rash    ALLERGEN: Oxycodone Hcl-oxycodone-asa (Other  . Percodan [Oxycodone-Aspirin] Other (See Comments)    Tingling     PAST MEDICAL HISTORY Past Medical History:  Diagnosis Date  . Complication of anesthesia    post op nausea and vomiting  .  Diabetes ([place])    Type 2  . History of echocardiogram 10/2012   Echo 4/14:  EF 60-65%, mild LAE, MAC, impaired relaxation  . Hypertension   . 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 PAF (paroxysmal atrial fibrillation) ([place])    Past Surgical History:  Procedure Laterality Date  . BIOPSY  04/05/2019   Procedure: BIOPSY;  Surgeon: Wonda Horner, MD;  Location: [facility] END[facility]COPY;  Service: Endoscopy;;  . BREAST LUMPECTOMY  1992   Left breast  . CARPAL TUNNEL RELEASE     Left hand 05/2012 and right hand dec 2013  . COLONOSCOPY WITH PROPOFOL N/A 04/05/2019   Procedure: COLONOSCOPY WITH PROPOFOL;  Surgeon: Wonda Horner, MD;  Location: [facility] ENDOSCOPY;  Service: Endoscopy;  Laterality: N/A;  . ESOPHAGOGASTRODUODENOSCOPY (EGD) WITH  PROPOFOL N/A 04/05/2019   Procedure: ESOPHAGOGASTRODUODENOSCOPY (EGD) WITH PROPOFOL;  Surgeon: Wonda Horner, MD;  Location: [facility] ENDOSCOPY;  Service: Endoscopy;  Laterality: N/A;  . TOTAL KNEE ARTHROPLASTY  07/2009   Left knee  . TRIGGER FINGER RELEASE  2015   Left thumb    FAMILY HISTORY Family History  Problem Relation Age of Onset  . Hypertension Father   . Heart attack Father   . Stroke Neg Hx     SOCIAL HISTORY Social History   Tobacco Use  . Smoking status: Never Smoker  . Smokeless tobacco: Never Used  Vaping Use  . Vaping Use: Never used  Substance Use Topics  . Alcohol use: No    Alcohol/week: 0.0 standard drinks  . Drug use: No         OPHTHALMIC EXAM:  Base Eye Exam    Visual Acuity (ETDRS)      Right Left   Dist [place] 20/40 -2 20/20 -2   Dist ph [place] 20/40 +2        Tonometry (Tonopen, 1:30 PM)      Right Left   Pressure 21 21       Pupils      Pupils Dark Light Shape React APD   Right PERRL 5 4 Round Brisk None   Left PERRL 5 4 Round Brisk None       Visual Fields (Counting fingers)      Left Right    Full Full       Extraocular Movement      Right Left    Full Full       Neuro/Psych    Oriented x3: Yes   Mood/Affect: Normal       Dilation    Both eyes: 1.0% Mydriacyl, 2.5% Phenylephrine @ 1:30 PM        Slit Lamp and Fundus Exam    External Exam      Right Left   External Normal Normal       Slit Lamp Exam      Right Left   Lids/Lashes Normal Normal   Conjunctiva/Sclera White and quiet White and quiet   Cornea Clear Clear   Anterior Chamber Deep and quiet Deep and quiet   Iris Round and reactive Round and reactive   Lens 3+ Nuclear sclerosis 2+ Nuclear sclerosis   Anterior Vitreous Normal Normal       Fundus Exam      Right Left   Posterior Vitreous Normal Normal   Disc Normal Normal   C/D Ratio 0.35 0.4   Macula Normal, neg Watzke, pseudo cystoid appearance to the fovea Normal, neg Watzke   Vessels Normal Normal    Periphery Normal  Normal          IMAGING AND PROCEDURES  Imaging and Procedures for 12/03/20  OCT, Retina - OU - Both Eyes       Right Eye Quality was good. Scan locations included subfoveal. Central Foveal Thickness: 335. Progression has been stable. Findings include vitreous traction, vitreomacular adhesion , abnormal foveal contour.   Left Eye Quality was good. Scan locations included subfoveal. Central Foveal Thickness: 240. Progression has improved. Findings include normal foveal contour.   Notes OD, inner foveal macular schisis right eye with intact outer retinal segments in the fovea suggesting no impact on acuity from large vitreomacular adhesion/traction  OS, no foveal elevation remains, incidental posterior vitreous detachment                ASSESSMENT/PLAN:  Nuclear sclerotic cataract of left eye The nature of cataract was discussed with the patient as well as the elective nature of surgery. The patient was reassured that surgery at a later date does not put the patient at risk for a worse outcome. It was emphasized that the need for surgery is dictated by the patient's quality of life as influenced by the cataract. Patient was instructed to maintain close follow up with their general eye care doctor.  OS, I discussed the patient the comparison to dark yellow-green sunglasses and the need for stronger light at home or potentially trouble seeing at nighttime or dark rainy days  Vitreomacular traction syndrome of right eye Broad inner retinal attachment with inner foveal macular retinal schisis.  Not likely to spontaneously resolve in the right eye as has recently occurred in the left  Macular retinoschisis of right eye Secondary to vitreomacular traction and accounts for acuity in conjunction with cataract  Nuclear sclerotic cataract of right eye Slightly worse cataract in the right eye with underlying maculopathy.  I do recommend counter extraction with  intraocular lens placement in the right eye which may promote further vitreous syneresis and vitreomacular adhesion release in the right eye.  If not clearance of the cataract from the view would allow for appropriate vitrectomy including anterior hyaloid face removal in order to release the vitreal macular traction syndrome of the right eye  Follow-up with Dr. Nathanial Rancher  Macular retinoschisis of left eye Much improved as of this date secondary to spontaneous release of vitreal macular traction syndrome left eye  PVD (posterior vitreous detachment), left eye Accounts for the release of the vitreomacular adhesion, traction and macular retina schisis improvement left eye      ICD-10-CM   1. Vitreomacular traction syndrome of right eye  H43.821 OCT, Retina - OU - Both Eyes  2. Macular retinoschisis of right eye  H33.101 OCT, Retina - OU - Both Eyes  3. Nuclear sclerotic cataract of right eye  H25.11   4. Nuclear sclerotic cataract of left eye  H25.12   5. Macular retinoschisis of left eye  H33.102   6. PVD (posterior vitreous detachment), left eye  H43.812     1.  Vitreomacular traction syndrome in the right eye with impact on visual acuity in conjunction with nuclear sclerotic cataract of the right eye  2.  Patient is followed by Dr. Nathanial Rancher, for which I suggest consideration of cataract traction with intraocular displaced on the right eye followed thereafter by monitoring the macula to look for spontaneous release of the vitreomacular adhesion of the right eye.  3.  OS, macular adhesion has relieved spontaneously with resolution of macular retinal schisis as the  VMT progressed to a normal PVD  Ophthalmic Meds Ordered this visit:  No orders of the defined types were placed in this encounter.      No follow-ups on file.  There are no Patient Instructions on file for this visit.   Explained the diagnoses, plan, and follow up with the patient and they expressed understanding.   Patient expressed understanding of the importance of proper follow up care.   Clent Demark Elsbeth Yearick M.D. Diseases & Surgery of the Retina and Vitreous [facility]ina [facility] Diabetic [place] 12/03/20     Abbreviations: M myopia (nearsighted); A astigmatism; H hyperopia (farsighted); P presbyopia; Mrx spectacle prescription;  CTL contact lenses; OD right eye; OS left eye; OU both eyes  XT exotropia; ET esotropia; PEK punctate epithelial keratitis; PEE punctate epithelial erosions; DES dry eye syndrome; MGD meibomian gland dysfunction; ATs artificial tears; PFAT's preservative free artificial tears; [place] nuclear sclerotic cataract; PSC posterior subcapsular cataract; ERM epi-retinal membrane; PVD posterior vitreous detachment; RD retinal detachment; DM diabetes mellitus; DR diabetic retinopathy; NPDR non-proliferative diabetic retinopathy; PDR proliferative diabetic retinopathy; CSME clinically significant macular edema; DME diabetic macular edema; dbh dot blot hemorrhages; CWS cotton wool spot; POAG primary open angle glaucoma; C/D cup-to-disc ratio; HVF humphrey visual field; GVF goldmann visual field; OCT optical coherence tomography; IOP intraocular pressure; BRVO Branch retinal vein occlusion; CRVO central retinal vein occlusion; CRAO central retinal artery occlusion; BRAO branch retinal artery occlusion; RT retinal tear; SB scleral buckle; PPV pars plana vitrectomy; VH Vitreous hemorrhage; PRP panretinal laser photocoagulation; IVK intravitreal kenalog; VMT vitreomacular traction; MH Macular hole;  NVD neovascularization of the disc; NVE neovascularization elsewhere; AREDS age related eye disease study; ARMD age related macular degeneration; POAG primary open angle glaucoma; EBMD epithelial/anterior basement membrane dystrophy; ACIOL anterior chamber intraocular lens; IOL intraocular lens; PCIOL posterior chamber intraocular lens; Phaco/IOL phacoemulsification with intraocular lens placement; [place] photorefractive  keratectomy; LASIK laser assisted in situ keratomileusis; HTN hypertension; DM diabetes mellitus; COPD chronic obstructive pulmonary disease

## 2020-12-03 NOTE — Assessment & Plan Note (Signed)
The nature of cataract was discussed with the patient as well as the elective nature of surgery. The patient was reassured that surgery at a later date does not put the patient at risk for a worse outcome. It was emphasized that the need for surgery is dictated by the patient's quality of life as influenced by the cataract. Patient was instructed to maintain close follow up with their general eye care doctor.  OS, I discussed the patient the comparison to dark yellow-green sunglasses and the need for stronger light at home or potentially trouble seeing at nighttime or dark rainy days

## 2020-12-03 NOTE — Assessment & Plan Note (Signed)
Broad inner retinal attachment with inner foveal macular retinal schisis.  Not likely to spontaneously resolve in the right eye as has recently occurred in the left

## 2020-12-03 NOTE — Assessment & Plan Note (Signed)
Much improved as of this date secondary to spontaneous release of vitreal macular traction syndrome left eye

## 2020-12-03 NOTE — Assessment & Plan Note (Signed)
Slightly worse cataract in the right eye with underlying maculopathy.  I do recommend counter extraction with intraocular lens placement in the right eye which may promote further vitreous syneresis and vitreomacular adhesion release in the right eye.  If not clearance of the cataract from the view would allow for appropriate vitrectomy including anterior hyaloid face removal in order to release the vitreal macular traction syndrome of the right eye  Follow-up with Dr. Nathanial Rancher

## 2020-12-03 NOTE — Assessment & Plan Note (Signed)
Secondary to vitreomacular traction and accounts for acuity in conjunction with cataract

## 2021-01-19 ENCOUNTER — Other Ambulatory Visit: Payer: Self-pay | Admitting: Interventional Cardiology

## 2021-01-20 NOTE — Telephone Encounter (Signed)
Pt's age 75, wt 127.1 kg, SCr 0.88, CrCl 112.54, last ov w/ JV 04/17/20

## 2021-03-11 ENCOUNTER — Encounter (INDEPENDENT_AMBULATORY_CARE_PROVIDER_SITE_OTHER): Payer: Medicare Other | Admitting: Ophthalmology

## 2021-04-03 ENCOUNTER — Encounter (INDEPENDENT_AMBULATORY_CARE_PROVIDER_SITE_OTHER): Payer: Medicare Other | Admitting: Ophthalmology

## 2021-04-17 NOTE — Progress Notes (Signed)
Cardiology Office Note   Date:  04/18/2021   ID:  Carrie Newton, DOB 1945/11/22, MRN [id number]  PCP:  Mayra Neer, MD    No chief complaint on file.  AFib  Wt Readings from Last 3 Encounters:  04/18/21 274 lb 6.4 oz (124.5 kg)  04/17/20 280 lb 3.2 oz (127.1 kg)  02/28/19 286 lb (129.7 kg)       History of Present Illness: Carrie Newton is a 75 y.o. female  Who has had PAF diagnosed several years ago.  EF 60-65% in 2014.   LA dimension of 4.7 cm.     She was treated with Xarelto for stroke prevention.  In November and December 2016, she experienced episodic nose bleeding. She was seen in the office. She was sent to ENT. There was a scab found on the bleeding area in her nose.   She has had some issues with hypoglycemia.   EGD/Colonoscopy in 2020 with Dr. Penelope Coop- no ulcers, no polyps.   In 2021, she had a kidney stone.  She had some hematuria in 3/21.  Has a kidney stone that hsa grown per her report in 2022. Has occasional flank pain.   Denies : Chest pain. Dizziness. Leg edema. Nitroglycerin use. Orthopnea.  Paroxysmal nocturnal dyspnea. Shortness of breath. Syncope.    Rare, brief palpitations.  Nothing sustained.  Not walking much. Needs cataract operation.     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   PAF (paroxysmal atrial fibrillation) [facility])     Past Surgical History:  Procedure Laterality Date   BIOPSY  04/05/2019   Procedure: BIOPSY;  Surgeon: Wonda Horner, MD;  Location: [facility] ENDOSCOPY;  Service: Endoscopy;;   BREAST LUMPECTOMY  1992   Left breast   CARPAL TUNNEL RELEASE     Left hand 05/2012 and right hand dec 2013   COLONOSCOPY WITH PROPOFOL N/A 04/05/2019   Procedure: COLONOSCOPY WITH PROPOFOL;  Surgeon: Wonda Horner, MD;  Location: [facility] ENDOSCOPY;  Service: Endoscopy;  Laterality: N/A;   ESOPHAGOGASTRODUODENOSCOPY (EGD) WITH PROPOFOL N/A 04/05/2019   Procedure: ESOPHAGOGASTRODUODENOSCOPY (EGD) WITH PROPOFOL;  Surgeon: Wonda Horner, MD;  Location[facility] ENDOSCOPY;  Service: Endoscopy;  Laterality: N/A;   TOTAL KNEE ARTHROPLASTY  07/2009   Left knee   TRIGGER FINGER RELEASE  2015   Left thumb     Current Outpatient Medications  Medication Sig Dispense Refill   ACCU-CHEK AVIVA PLUS test strip 1 each by Other route as needed for other (SUGAR CHECK).      alendronate (FOSAMAX) 70 MG tablet Take 70 mg by mouth once a week.     apixaban (ELIQUIS) 5 MG TABS tablet TAKE 1 TABLET BY MOUTH  TWICE DAILY 180 tablet 1   atorvastatin (LIPITOR) 20 MG tablet Take 20 mg by mouth daily at 6 PM.      buPROPion (WELLBUTRIN XL) 300 MG 24 hr tablet Take 300 mg by mouth daily. Take 1 tab daily     felodipine (PLENDIL) 10 MG 24 hr tablet Take 10 mg daily by mouth.     HYDROcodone-acetaminophen (NORCO) 10-325 MG per tablet Take 2 tablets by mouth daily. Take as directed     Insulin Degludec (  TRESIBA) 100 UNIT/ML SOLN Inject into the skin. Use depends on blood sugar level.     insulin lispro (HUMALOG) 100 UNIT/ML injection Inject into the skin 3 (three) times daily before meals. Use as needed for blood sugar levels before meals.     levothyroxine (SYNTHROID) 88 MCG tablet Take 88 mcg by mouth daily before breakfast. Alternate with 75 mcg     levothyroxine (SYNTHROID, LEVOTHROID) 75 MCG tablet Take 75 mcg by mouth daily before breakfast. Alternate with 88 mcg tab     losartan (COZAAR) 50 MG tablet Take 0.5 tablets by mouth daily.     Multiple Vitamin (MULTIVITAMIN) tablet Take 1 tablet by mouth daily.     mupirocin ointment (BACTROBAN) 2 % Apply 1 application topically continuous as needed (CUTS).      OZEMPIC, 1 MG/DOSE, 2 MG/1.5ML SOPN      pantoprazole (PROTONIX) 40 MG tablet Take 40 mg by mouth every other day.       sertraline (ZOLOFT) 100 MG tablet Take 100 mg by mouth daily.     No current facility-administered medications for this visit.    Allergies:   Metoprolol tartrate, Neosporin [neomycin-bacitracin zn-polymyx], Norvasc [amlodipine besylate], Other, and Percodan [oxycodone-aspirin]    Social History:  The patient  reports that she has never smoked. She has never used smokeless tobacco. She reports that she does not drink alcohol and does not use drugs.   Family History:  The patient's family history includes Heart attack in her father; Hypertension in her father.    ROS:  Please see the history of present illness.   Otherwise, review of systems are positive for blood in urine.   All other systems are reviewed and negative.    PHYSICAL EXAM: VS:  BP 138/68   Pulse 67   Ht _0  (1.6 m)   Wt 274 lb 6.4 oz (124.5 kg)   SpO2 96%   BMI 48.61 kg/m  , BMI Body mass index is 48.61 kg/m. GEN: Well nourished, well developed, in no acute distress HEENT: normal Neck: no JVD, carotid bruits, or masses Cardiac: RRR; no murmurs, rubs, or gallops,no edema  Respiratory:  clear to auscultation bilaterally, normal work of breathing GI: soft, nontender, nondistended, + BS, obese MS: no deformity or atrophy Skin: warm and dry, no rash Neuro:  Strength and sensation are intact Psych: euthymic mood, full affect   EKG:   The ekg ordered today demonstrates NSR, no T changes   Recent Labs: No results found for requested labs within last 8760 hours.   Lipid Panel No results found for: CHOL, TRIG, HDL, CHOLHDL, VLDL, LDLCALC, LDLDIRECT   Other studies Reviewed: Additional studies/ records that were reviewed today with results demonstrating: labs reviewed.   ASSESSMENT AND PLAN:  AFib:  In NSR. Not on antiarrhythmic drugs.   Hypertensive heart disease: The current medical regimen is effective;  continue present plan and medications.  Low-salt diet.  Avoid processed foods. DM: Whole food,  plant-based diet.  Increase fiber intake.  Managed by primary care.  A1C 7.2. Hyperlipidemia: on atorvastatin.  Walking recommendations as noted below.  LDL 65, HDL 81 in 2022. Anticoagulated: Eliquis for stroke prevention.  Watch for bleeding problems.  Hbg 11.2 in 04/2021, stable per her report. Up to date on her colonoscopy.  This patients CHA2DS2-VASc Score and unadjusted Ischemic Stroke Rate (% per year) is equal to 3.2 % stroke rate/year from a score of 3  Above score calculated as 1 point each if present [  CHF, HTN, DM, Vascular=MI/PAD/Aortic Plaque, Age if 65-74, or Female] Above score calculated as 2 points each if present [Age > 75, or Stroke/TIA/TE]    Current medicines are reviewed at length with the patient today.  The patient concerns regarding her medicines were addressed.  The following changes have been made:  No change  Labs/ tests ordered today include:   Orders Placed This Encounter  Procedures   EKG 12-Lead    Recommend 150 minutes/week of aerobic exercise Low fat, low carb, high fiber diet recommended  Disposition:   FU in 1 year   Signed, Larae Grooms, MD  04/18/2021 2:42 PM    [facility] [address], [place], [place]  [address] Phone: [phone]; Fax: [phone]

## 2021-04-18 ENCOUNTER — Other Ambulatory Visit: Payer: Self-pay

## 2021-04-18 ENCOUNTER — Ambulatory Visit (INDEPENDENT_AMBULATORY_CARE_PROVIDER_SITE_OTHER): Payer: Medicare Other | Admitting: Interventional Cardiology

## 2021-04-18 ENCOUNTER — Encounter: Payer: Self-pay | Admitting: Interventional Cardiology

## 2021-04-18 VITALS — BP 138/68 | HR 67 | Ht 63.0 in | Wt 274.4 lb

## 2021-04-18 DIAGNOSIS — E119 Type 2 diabetes mellitus without complications: Secondary | ICD-10-CM | POA: Diagnosis not present

## 2021-04-18 DIAGNOSIS — E782 Mixed hyperlipidemia: Secondary | ICD-10-CM

## 2021-04-18 DIAGNOSIS — Z794 Long term (current) use of insulin: Secondary | ICD-10-CM

## 2021-04-18 DIAGNOSIS — I480 Paroxysmal atrial fibrillation: Secondary | ICD-10-CM | POA: Diagnosis not present

## 2021-04-18 DIAGNOSIS — Z7901 Long term (current) use of anticoagulants: Secondary | ICD-10-CM | POA: Diagnosis not present

## 2021-04-18 DIAGNOSIS — I119 Hypertensive heart disease without heart failure: Secondary | ICD-10-CM | POA: Diagnosis not present

## 2021-04-18 NOTE — Patient Instructions (Signed)
Medication Instructions:  Your physician recommends that you continue on your current medications as directed. Please refer to the Current Medication list given to you today.  *If you need a refill on your cardiac medications before your next appointment, please call your pharmacy*   Lab Work: NONE If you have labs (blood work) drawn today and your tests are completely normal, you will receive your results only by: [place] (if you have MyChart) OR A paper copy in the mail If you have any lab test that is abnormal or we need to change your treatment, we will call you to review the results.   Testing/Procedures: NONE   Follow-Up: At [facility], you and your health needs are our priority.  As part of our continuing mission to provide you with exceptional heart care, we have created designated Provider Care Teams.  These Care Teams include your primary Cardiologist (physician) and Advanced Practice Providers (APPs -  Physician Assistants and Nurse Practitioners) who all work together to provide you with the care you need, when you need it.  We recommend signing up for the patient portal called "MyChart".  Sign up information is provided on this After Visit Summary.  MyChart is used to connect with patients for Virtual Visits (Telemedicine).  Patients are able to view lab/test results, encounter notes, upcoming appointments, etc.  Non-urgent messages can be sent to your provider as well.   To learn more about what you can do with MyChart, go to [email or web address].    Your next appointment:   12 month(s)  The format for your next appointment:   In Person  Provider:   You may see Larae Grooms, MD or one of the following Advanced Practice Providers on your designated Care Team:   Melina Copa, PA-C Ermalinda Barrios, PA-C

## 2021-04-29 ENCOUNTER — Other Ambulatory Visit: Payer: Self-pay

## 2021-04-29 ENCOUNTER — Ambulatory Visit: Payer: Medicare Other | Attending: Internal Medicine

## 2021-04-29 DIAGNOSIS — M62838 Other muscle spasm: Secondary | ICD-10-CM | POA: Diagnosis present

## 2021-04-29 DIAGNOSIS — M436 Torticollis: Secondary | ICD-10-CM | POA: Insufficient documentation

## 2021-04-29 DIAGNOSIS — M6281 Muscle weakness (generalized): Secondary | ICD-10-CM | POA: Insufficient documentation

## 2021-04-29 NOTE — Patient Instructions (Signed)
Access Code: [email or web address] URL: [email or web address] Date: 04/29/2021 Prepared by: Candyce Churn  Exercises Standing Shoulder Extension with Resistance - 1 x daily - 7 x weekly - 2 sets - 10 reps Standing Row with Resistance - 1 x daily - 7 x weekly - 3 sets - 10 reps Seated Single Arm Bicep Curls with Rotation and Dumbbell - 1 x daily - 7 x weekly - 2 sets - 10 reps Seated Elbow Extension with Self-Anchored Resistance - 1 x daily - 7 x weekly - 2 sets - 10 reps

## 2021-04-29 NOTE — Therapy (Signed)
[facility] @ [place], [place], [address] Phone: [phone]   Fax:  [phone]  Physical Therapy Evaluation  Patient Details  Name: Carrie Newton MRN: [id number] Date of Birth: [birth year] Referring Provider (PT): Delrae Rend, MD   Encounter Date: 04/29/2021   PT End of Session - 04/29/21 1500     Visit Number 1    Number of Visits 12    Date for PT Re-Evaluation 06/10/21    Authorization Type [employer] [facility]    Progress Note Due on Visit 10    PT Start Time 0205    PT Stop Time 0300    PT Time Calculation (min) 55 min    Activity Tolerance Patient tolerated treatment well    Behavior During Therapy [facility] for tasks assessed/performed             Past Medical History:  Diagnosis Date   Complication of anesthesia    post op nausea and vomiting   Diabetes ([place])    Type 2   History of echocardiogram 10/2012   Echo 4/14:  EF 60-65%, mild LAE, MAC, impaired relaxation   Hypertension    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   PAF (paroxysmal atrial fibrillation) ([place])     Past Surgical History:  Procedure Laterality Date   BIOPSY  04/05/2019   Procedure: BIOPSY;  Surgeon: Wonda Horner, MD;  Location: [place];  Service: Endoscopy;;   BREAST LUMPECTOMY  1992   Left breast   CARPAL TUNNEL RELEASE     Left hand 05/2012 and right hand dec 2013   COLONOSCOPY WITH PROPOFOL N/A 04/05/2019   Procedure: COLONOSCOPY WITH PROPOFOL;  Surgeon: Wonda Horner, MD;  [facility] ENDOSCOPY;  Service: Endoscopy;  Laterality: N/A;   ESOPHAGOGASTRODUODENOSCOPY (EGD) WITH PROPOFOL N/A 04/05/2019   Procedure: ESOPHAGOGASTRODUODENOSCOPY (EGD) WITH PROPOFOL;  Surgeon: Wonda Horner, MD;  Location: [facility] ENDOSCOPY;  Service: Endoscopy;  Laterality: N/A;   TOTAL KNEE ARTHROPLASTY  07/2009   Left knee   TRIGGER FINGER RELEASE  2015   Left thumb    There were no vitals filed for this visit.    Subjective Assessment - 04/29/21 1520      Subjective Patient reports MD sent her for upper body strength but she isn't sure why other than she has daily pain in her hands and occasional in her neck and shoulders.  She admits she has been less active due to bilateral knee OA as well.  She is retired.    Limitations Lifting    How long can you sit comfortably? unlimited    How long can you stand comfortably? 10 min    How long can you walk comfortably? 10 min    Patient Stated Goals To gain upper body strength and be able to do things with her hands with less strain.    Currently in Pain? Yes    Pain Score 7     Pain Location Hand    Pain Orientation Right;Left    Pain Descriptors / Indicators Aching;Constant    Pain Type Chronic pain    Pain Onset More than a month ago    Pain Frequency Constant    Aggravating Factors  gripping items    Pain Relieving Factors rest, medication    Effect of Pain on Daily Activities Difficulty with routine daily tasks that require grip strength and UE strength    Multiple Pain Sites No  [facility] PT Assessment - 04/29/21 0001       Assessment   Medical Diagnosis Upper body weakness    Referring Provider (PT) Delrae Rend, MD    Onset Date/Surgical Date 04/29/20    Hand Dominance Right      Balance Screen   Has the patient fallen in the past 6 months No    Has the patient had a decrease in activity level because of a fear of falling?  Yes    Is the patient reluctant to leave their home because of a fear of falling?  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                PT Education - 04/29/21 1455     Education Details HEP    Person(s) Educated Patient    Methods Demonstration;Verbal cues;Handout    Comprehension Verbalized understanding;Returned demonstration;Verbal cues required              PT Short Term Goals - 04/29/21 1519       PT SHORT TERM GOAL #1   Title Independence with HEP    Status New    Target Date 05/20/21      PT SHORT TERM GOAL #2   Title Pain no greater than 5/10               PT Long Term Goals - 04/29/21 1656       PT LONG TERM GOAL #1   Title Patient to be independent with advanced HEP    Time 6    Period Weeks    Status New    Target Date 06/10/21      PT LONG TERM GOAL #2   Title Patient to report pain no greater than 2/10 with routine daily activities to allow increased ease with dressing, bathing and hygiene.    Baseline 6/10 difficulty with ADL's    Time 6    Period Weeks    Status New    Target Date 06/10/21      PT LONG TERM GOAL #3   Title Patient to be more active in the community and attend get togethers with friends    Baseline Reluctant to attend social activities.    Time 6    Period Weeks    Status New    Target Date 06/10/21                    Plan - 04/29/21 1454     Clinical Impression Statement Patient referred to PT for upper body weakness. She has multiple medical issues and co-morbidities.  She has diagnosis of right middle trigger finger and left thumb MCP OA.  She has good cervical and shoulder ROM but is slightly limited in bilateral shoulder IR.  Strength is generally 4-/5 throughout bilateral upper extremities.  She has rounded shoulders and slight forward head.  She would benefit from global  upper body strengthening to reduce stress to the hands and wrists along with postural strengthening to reduce neck and soft tissue pain.    Personal Factors and Comorbidities Comorbidity 1;Comorbidity 2;Comorbidity 3+    Comorbidities A fib, osteoporosis, OA    Examination-Activity Limitations Hygiene/Grooming;Lift;Carry;Dressing    Examination-Participation Restrictions Cleaning;Community Activity;Meal Prep    Stability/Clinical Decision Making Evolving/Moderate complexity    Clinical Decision Making Moderate    Rehab Potential Good    PT Frequency 2x / week    PT Duration 6 weeks    PT Treatment/Interventions ADLs/Self Care Home Management;Cryotherapy;Ultrasound;Traction;Moist Heat;Iontophoresis 42m/ml Dexamethasone;Electrical Stimulation;Neuromuscular re-education;Therapeutic exercise;Therapeutic activities;Patient/family education;Manual techniques;Passive range of motion;Dry needling;Splinting;Taping;Spinal Manipulations;Vasopneumatic Device    PT Next Visit Plan Assess tolerance to HEP; add putty for basic finger exercises    PT Home Exercise Plan Access Code: [email or web address]   Consulted and Agree with Plan of Care Patient             Patient will benefit from skilled therapeutic intervention in order to improve the following deficits and impairments:  Hypomobility, Increased muscle spasms, Decreased range of motion, Impaired perceived functional ability, Decreased activity tolerance, Decreased strength, Increased fascial restricitons, Pain  Visit Diagnosis: Stiffness of cervical spine - Plan: PT plan of care cert/re-cert  Other muscle spasm - Plan: PT plan of care cert/re-cert  Muscle weakness (generalized) - Plan: PT plan of care cert/re-cert     Problem List Patient Active Problem List   Diagnosis Date Noted   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   PVD (posterior vitreous detachment), left eye 12/03/2020   Vitreomacular traction syndrome of right eye 05/22/2020    Macular retinoschisis of right eye 05/22/2020   Nuclear sclerotic cataract of right eye 04/17/2020   Nuclear sclerotic cataract of left eye 04/17/2020   Hypertensive heart disease without heart failure 10/20/2017   Hyperlipidemia 07/31/2015   Controlled type 2 diabetes mellitus without complication, with long-term current use of insulin ([place] 06/25/2015   Atrial fibrillation ([place] 09/11/2013   Long term current use of anticoagulant therapy 09/11/2013   Obesity 09/11/2013   Essential hypertension, benign 09/11/2013    JAnderson MaltaB. Ali Mclaurin, PT 10/25/225:02 PM   [facility] [place] [address]Phone: [phone]  Fax:  [phone] Name: Carrie TERLECKIMRN: [id number] of Birth: 512/25/1947

## 2021-05-01 ENCOUNTER — Ambulatory Visit: Payer: Medicare Other

## 2021-05-01 NOTE — Addendum Note (Signed)
Addended by: Candyce Churn B on: 05/01/2021 11:38 AM   Modules accepted: Orders

## 2021-05-01 NOTE — Addendum Note (Signed)
Addended by: Candyce Churn B on: 05/01/2021 01:25 PM   Modules accepted: Orders

## 2021-05-01 NOTE — Addendum Note (Signed)
Addended by: Candyce Churn B on: 05/01/2021 12:40 PM   Modules accepted: Orders

## 2021-05-05 ENCOUNTER — Telehealth: Payer: Self-pay | Admitting: Interventional Cardiology

## 2021-05-05 DIAGNOSIS — R0602 Shortness of breath: Secondary | ICD-10-CM

## 2021-05-05 DIAGNOSIS — R601 Generalized edema: Secondary | ICD-10-CM

## 2021-05-05 DIAGNOSIS — Z79899 Other long term (current) drug therapy: Secondary | ICD-10-CM

## 2021-05-05 NOTE — Telephone Encounter (Signed)
Pt c/o swelling: STAT is pt has developed SOB within 24 hours  How much weight have you gained and in what time span?  About 8 lbs in 1 week  If swelling, where is the swelling located?  Ankles   Are you currently taking a fluid pill?  No   Are you currently SOB?  No   Do you have a log of your daily weights (if so, list)?  10/31: 286 lbs  Have you gained 3 pounds in a day or 5 pounds in a week?  Patient states she gained about 8 lbs in 1 week  Have you traveled recently?  No

## 2021-05-05 NOTE — Telephone Encounter (Signed)
Called and spoke to the patient. She states that she stats that starting last Tuesday her ankles started swelling more and has been getting winded easier when up moving around. Admits to not being very active. Encouraged increased activity/walking.   States that she has gained weight over the past week. Was 273 lbs last week, now 286 lbs. She states that she has been weighing at the same time everyday with the same amount of clothes. Denies increased sodium intake. Instructed the patient to elevate legs to help with swelling and to continue to stay away from the salty foods.   Patient has a Hx of Afib. Was in NSR at last OV on 10/14. She states that she felt some fluttering in her chest earlier today, but this was the first time. She has been taking her Eliquis and has not missed any doses.   She is not on any diuretics. On 04/14/21 she had labs done with her PCP showing Cr 0.87, K 4.8. BP 128/70 HR 63. Last echo 2014 showing normal EF.  Will forward to Dr. Irish Lack for review.

## 2021-05-06 ENCOUNTER — Other Ambulatory Visit: Payer: Self-pay

## 2021-05-06 ENCOUNTER — Ambulatory Visit: Payer: Medicare Other | Attending: Internal Medicine

## 2021-05-06 DIAGNOSIS — M6281 Muscle weakness (generalized): Secondary | ICD-10-CM | POA: Insufficient documentation

## 2021-05-06 DIAGNOSIS — M436 Torticollis: Secondary | ICD-10-CM | POA: Diagnosis present

## 2021-05-06 DIAGNOSIS — M62838 Other muscle spasm: Secondary | ICD-10-CM | POA: Diagnosis present

## 2021-05-06 MED ORDER — FUROSEMIDE 40 MG PO TABS: 40.0000 mg | ORAL_TABLET | Freq: Every day | ORAL | 3 refills | Status: DC | PRN

## 2021-05-06 NOTE — Telephone Encounter (Signed)
Gave patient recommendation. Educated on medication and potassium rich foods, daily weights and guidance when to use PRN lasix. Orders placed for Echo, labs and medication. Labs scheduled.  Patient verbalized understanding.

## 2021-05-06 NOTE — Telephone Encounter (Signed)
OK to start Lasix 40 mg daily prn, would take daily for now.  Eat potassium rich foods when she takes the Lasix. Echo cardiogram for shortness of breath as well.   BMet, BNP in 1 week.

## 2021-05-06 NOTE — Therapy (Signed)
[facility] @ [place], [place], [address] Phone: [phone]   Fax:  [phone]  Physical Therapy Treatment  Patient Details  Name: Carrie Newton MRN: [id number] Date of Birth: April 16, 1946 Referring Provider (PT): Delrae Rend, MD   Encounter Date: 05/06/2021   PT End of Session - 05/06/21 1454     Visit Number 2    Number of Visits 12    Date for PT Re-Evaluation 06/10/21    Authorization Type UHC [place]    Progress Note Due on Visit 10    PT Start Time 6644    PT Stop Time 0347    PT Time Calculation (min) 43 min    Activity Tolerance Patient tolerated treatment well    Behavior During Therapy [facility] for tasks assessed/performed             Past Medical History:  Diagnosis Date   Complication of anesthesia    post op nausea and vomiting   Diabetes ([place])    Type 2   History of echocardiogram 10/2012   Echo 4/14:  EF 60-65%, mild LAE, MAC, impaired relaxation   Hypertension    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   PAF (paroxysmal atrial fibrillation) ([place])     Past Surgical History:  Procedure Laterality Date   BIOPSY  04/05/2019   Procedure: BIOPSY;  Surgeon: Wonda Horner, MD;  Location: [facility];  Service: Endoscopy;;   BREAST LUMPECTOMY  1992   Left breast   CARPAL TUNNEL RELEASE     Left hand 05/2012 and right hand dec 2013   COLONOSCOPY WITH PROPOFOL N/A 04/05/2019   Procedure: COLONOSCOPY WITH PROPOFOL;  Surgeon: Wonda Horner, MD;  [facility] ENDOSCOPY;  Service: Endoscopy;  Laterality: N/A;   ESOPHAGOGASTRODUODENOSCOPY (EGD) WITH PROPOFOL N/A 04/05/2019   Procedure: ESOPHAGOGASTRODUODENOSCOPY (EGD) WITH PROPOFOL;  Surgeon: Wonda Horner, MD;  Location: [facility] ENDOSCOPY;  Service: Endoscopy;  Laterality: N/A;   TOTAL KNEE ARTHROPLASTY  07/2009   Left knee   TRIGGER FINGER RELEASE  2015   Left thumb    There were no vitals filed for this visit.   Subjective Assessment - 05/06/21 1455      Subjective Patient reports she is doing well.  She states her right trigger finger is flared up today.  No other issues to report today.    Limitations Lifting    How long can you sit comfortably? unlimited    How long can you stand comfortably? 10 min    How long can you walk comfortably? 10 min    Patient Stated Goals To gain upper body strength and be able to do things with her hands with less strain.    Pain Score 0-No pain    Pain Onset More than a month ago                               [facility] Adult PT Treatment/Exercise - 05/06/21 0001       Elbow Exercises   Elbow Extension Both;10 reps;Other reps (comment)   2 sets with 2 lb dumbell     Neck Exercises: Machines for Strengthening   UBE (Upper Arm Bike) seat 10 L1 x 5 min      Neck Exercises: Seated   Neck Retraction 10 reps    Cervical Rotation Both;10 reps    Lateral Flexion Both;10 reps      Shoulder Exercises: Seated  Theraband Level (Shoulder ABduction) Level 2 (Red)   2 x 10   Diagonals AROM;Both;10 reps;Theraband;Other (comment)   Red D2 PNF   Other Seated Exercises Bilateral shoulder ER with red band 2 x 10      Shoulder Exercises: Standing   Theraband Level (Shoulder External Rotation) Level 2 (Red);Other (comment)   2 sets of 10 seated   Theraband Level (Shoulder Extension) Level 2 (Red)   standing band with handles 2 x 10   Theraband Level (Shoulder Row) Level 2 (Red)   band with handles, standing 2 x 10                      PT Short Term Goals - 04/29/21 1519       PT SHORT TERM GOAL #1   Title Independence with HEP    Status New    Target Date 05/20/21      PT SHORT TERM GOAL #2   Title Pain no greater than 5/10               PT Long Term Goals - 04/29/21 1656       PT LONG TERM GOAL #1   Title Patient to be independent with advanced HEP    Time 6    Period Weeks    Status New    Target Date 06/10/21      PT LONG TERM GOAL #2   Title Patient to  report pain no greater than 2/10 with routine daily activities to allow increased ease with dressing, bathing and hygiene.    Baseline 6/10 difficulty with ADL's    Time 6    Period Weeks    Status New    Target Date 06/10/21      PT LONG TERM GOAL #3   Title Patient to be more active in the community and attend get togethers with friends    Baseline Reluctant to attend social activities.    Time 6    Period Weeks    Status New    Target Date 06/10/21                   Plan - 05/06/21 1521     Clinical Impression Statement Patient had some difficulty with grip due to her left thumb and right middle trigger finger but completed all tasks with modifying grip positions.  She had no increase in pain.  She needed minimal repeated instruction for technique.  She would benefit from continued skilled PT for postural and UE strengthening.    Personal Factors and Comorbidities Comorbidity 1;Comorbidity 2;Comorbidity 3+    Comorbidities A fib, osteoporosis, OA    Examination-Activity Limitations Hygiene/Grooming;Lift;Carry;Dressing    Examination-Participation Restrictions Cleaning;Community Activity;Meal Prep    Stability/Clinical Decision Making Evolving/Moderate complexity    Rehab Potential Good    PT Frequency 2x / week    PT Duration 6 weeks    PT Treatment/Interventions ADLs/Self Care Home Management;Cryotherapy;Ultrasound;Traction;Moist Heat;Iontophoresis 36m/ml Dexamethasone;Electrical Stimulation;Neuromuscular re-education;Therapeutic exercise;Therapeutic activities;Patient/family education;Manual techniques;Passive range of motion;Dry needling;Splinting;Taping;Spinal Manipulations;Vasopneumatic Device    PT Next Visit Plan Progress UE and postural strengthening.    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   PVD (posterior vitreous detachment), left eye 12/03/2020   Vitreomacular traction syndrome of right eye 05/22/2020   Macular retinoschisis of right eye 05/22/2020   Nuclear sclerotic cataract of right eye 04/17/2020   Nuclear sclerotic cataract of left eye 04/17/2020   Hypertensive heart disease without heart failure 10/20/2017   Hyperlipidemia 07/31/2015   Controlled type 2 diabetes mellitus without complication, with long-term current use of insulin ([place]) 06/25/2015   Atrial fibrillation ([place]) 09/11/2013   Long term current use of anticoagulant therapy 09/11/2013   Obesity 09/11/2013   Essential hypertension, benign 09/11/2013    Anderson Malta B. Juanito Gonyer, PT 11/01/223:59 PM   [facility] @ [place], [place], [address] Phone: [phone]   Fax:  [phone]  Name: Carrie Newton MRN: [id number] Date of Birth: 07-27-1945

## 2021-05-08 ENCOUNTER — Other Ambulatory Visit: Payer: Self-pay

## 2021-05-08 ENCOUNTER — Ambulatory Visit: Payer: Medicare Other

## 2021-05-08 DIAGNOSIS — M436 Torticollis: Secondary | ICD-10-CM

## 2021-05-08 DIAGNOSIS — M6281 Muscle weakness (generalized): Secondary | ICD-10-CM

## 2021-05-08 DIAGNOSIS — M62838 Other muscle spasm: Secondary | ICD-10-CM

## 2021-05-08 NOTE — Therapy (Signed)
[place] @ [place], [place], [address] Phone: [phone]   Fax:  [phone]  Physical Therapy Treatment  Patient Details  Name: Carrie Newton MRN: [id number] Date of Birth: 12/19/1945 Referring Provider (PT): Delrae Rend, MD   Encounter Date: 05/08/2021   PT End of Session - 05/08/21 1537     Visit Number 3    Number of Visits 12    Date for PT Re-Evaluation 06/10/21    Authorization Type [employer] [place]    Progress Note Due on Visit 10    PT Start Time 7048    PT Stop Time 1529    PT Time Calculation (min) 44 min    Activity Tolerance Patient tolerated treatment well    Behavior During Therapy [facility] for tasks assessed/performed             Past Medical History:  Diagnosis Date   Complication of anesthesia    post op nausea and vomiting   Diabetes ([place])    Type 2   History of echocardiogram 10/2012   Echo 4/14:  EF 60-65%, mild LAE, MAC, impaired relaxation   Hypertension    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   PAF (paroxysmal atrial fibrillation) ([place])     Past Surgical History:  Procedure Laterality Date   BIOPSY  04/05/2019   Procedure: BIOPSY;  Surgeon: Wonda Horner, MD;  Location: [place];  Service: Endoscopy;;   BREAST LUMPECTOMY  1992   Left breast   CARPAL TUNNEL RELEASE     Left hand 05/2012 and right hand dec 2013   COLONOSCOPY WITH PROPOFOL N/A 04/05/2019   Procedure: COLONOSCOPY WITH PROPOFOL;  Surgeon: Wonda Horner, MD;  Location: [facility] ENDOSCOPY;  Service: Endoscopy;  Laterality: N/A;   ESOPHAGOGASTRODUODENOSCOPY (EGD) WITH PROPOFOL N/A 04/05/2019   Procedure: ESOPHAGOGASTRODUODENOSCOPY (EGD) WITH PROPOFOL;  Surgeon: Wonda Horner, MD;  Location: [facility] ENDOSCOPY;  Service: Endoscopy;  Laterality: N/A;   TOTAL KNEE ARTHROPLASTY  07/2009   Left knee   TRIGGER FINGER RELEASE  2015   Left thumb    There were no vitals filed for this visit.   Subjective Assessment - 05/08/21 1450      Subjective Patient states she feels she is focusing on her posture more.  She admits her hand issues have been bothersome but she understands that the upper body exercises are helping and that her hands may get a little agitated with the strengthening exercises.    Limitations Lifting    How long can you sit comfortably? unlimited    How long can you stand comfortably? 10 min    How long can you walk comfortably? 10 min    Patient Stated Goals To gain upper body strength and be able to do things with her hands with less strain.    Currently in Pain? No/denies    Pain Onset More than a month ago                               [facility] Adult PT Treatment/Exercise - 05/08/21 0001       Exercises   Exercises Elbow      Elbow Exercises   Elbow Flexion Strengthening;Both;10 reps;Seated;Theraband;Other (comment)   2 sets of 10   Theraband Level (Elbow Flexion) Level 2 (Red)    Elbow Extension Both;10 reps;Other reps (comment)   Did with red tband today/ 2 sets  Neck Exercises: Machines for Strengthening   UBE (Upper Arm Bike) seat 10 L1 x 5 min      Neck Exercises: Seated   Neck Retraction 10 reps    Cervical Rotation Both;10 reps    Lateral Flexion Both;10 reps      Neck Exercises: Stretches   Upper Trapezius Stretch Right;Left;5 reps;10 seconds      Shoulder Exercises: Standing   Theraband Level (Shoulder External Rotation) Level 2 (Red);Other (comment)   2 sets of 10 seated   Theraband Level (Shoulder Extension) Level 2 (Red)   standing band with handles 2 x 10   Theraband Level (Shoulder Row) Level 2 (Red)   band with handles, standing 2 x 10     Shoulder Exercises: ROM/Strengthening   [place]anger 20 cw and 20 ccw both at 90 degrees shoulder flexion                     PT Education - 05/08/21 1534     Education Details Added upper trap stretch and cervical ROM to HEP    Person(s) Educated Patient    Methods Explanation;Demonstration;Verbal  cues;Handout    Comprehension Verbalized understanding;Returned demonstration;Verbal cues required              PT Short Term Goals - 04/29/21 1519       PT SHORT TERM GOAL #1   Title Independence with HEP    Status New    Target Date 05/20/21      PT SHORT TERM GOAL #2   Title Pain no greater than 5/10               PT Long Term Goals - 04/29/21 1656       PT LONG TERM GOAL #1   Title Patient to be independent with advanced HEP    Time 6    Period Weeks    Status New    Target Date 06/10/21      PT LONG TERM GOAL #2   Title Patient to report pain no greater than 2/10 with routine daily activities to allow increased ease with dressing, bathing and hygiene.    Baseline 6/10 difficulty with ADL's    Time 6    Period Weeks    Status New    Target Date 06/10/21      PT LONG TERM GOAL #3   Title Patient to be more active in the community and attend get togethers with friends    Baseline Reluctant to attend social activities.    Time 6    Period Weeks    Status New    Target Date 06/10/21                   Plan - 05/08/21 1537     Clinical Impression Statement Patient arrives with no complaints of pain today other than her knees.  She is demonstrating increased tolerance and increased reps with all exercises.  She continues to need reminders regarding posture but is also commenting independently about her posture and is more aware.  She would benefit from continued skilled PT for  resistance exercises for upper body as well as postural exercises to allow her to participate in community activities and gatherings with her friends.    Personal Factors and Comorbidities Comorbidity 1;Comorbidity 2;Comorbidity 3+    Comorbidities A fib, osteoporosis, OA    Examination-Activity Limitations Hygiene/Grooming;Lift;Carry;Dressing    Examination-Participation Restrictions Cleaning;Community Activity;Meal Prep    Stability/Clinical Decision Making  Evolving/Moderate complexity    Rehab Potential Good    PT Frequency 2x / week    PT Duration 6 weeks    PT Treatment/Interventions ADLs/Self Care Home Management;Cryotherapy;Ultrasound;Traction;Moist Heat;Iontophoresis 39m/ml Dexamethasone;Electrical Stimulation;Neuromuscular re-education;Therapeutic exercise;Therapeutic activities;Patient/family education;Manual techniques;Passive range of motion;Dry needling;Splinting;Taping;Spinal Manipulations;Vasopneumatic Device    PT Next Visit Plan Progress UE and postural strengthening.    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   PVD (posterior vitreous detachment), left eye 12/03/2020   Vitreomacular traction syndrome of right eye 05/22/2020   Macular retinoschisis of right eye 05/22/2020   Nuclear sclerotic cataract of right eye 04/17/2020   Nuclear sclerotic cataract of left eye 04/17/2020   Hypertensive heart disease without heart failure 10/20/2017   Hyperlipidemia 07/31/2015   Controlled type 2 diabetes mellitus without complication, with long-term current use of insulin ([place] 06/25/2015   Atrial fibrillation ([place] 09/11/2013   Long term current use of anticoagulant therapy 09/11/2013   Obesity 09/11/2013   Essential hypertension, benign 09/11/2013    JAnderson MaltaB. Wilfrid Hyser, PT 11/03/223:48 PM   [place] [place] [address]Phone: [phone]  Fax:  [phone] Name: BRAIMA GEATHERSMRN: [id number] of Birth: [birth year]

## 2021-05-13 ENCOUNTER — Other Ambulatory Visit: Payer: Self-pay

## 2021-05-13 ENCOUNTER — Ambulatory Visit: Payer: Medicare Other

## 2021-05-13 DIAGNOSIS — M436 Torticollis: Secondary | ICD-10-CM | POA: Diagnosis not present

## 2021-05-13 DIAGNOSIS — M62838 Other muscle spasm: Secondary | ICD-10-CM

## 2021-05-13 DIAGNOSIS — M6281 Muscle weakness (generalized): Secondary | ICD-10-CM

## 2021-05-13 NOTE — Therapy (Signed)
[place] @ [place], [place], [address] Phone: [phone]   Fax:  [phone]  Physical Therapy Treatment  Patient Details  Name: Carrie Newton MRN: [id number] Date of Birth: June 05, 1946 Referring Provider (PT): Delrae Rend, MD   Encounter Date: 05/13/2021   PT End of Session - 05/13/21 1450     Visit Number 4    Number of Visits 12    Date for PT Re-Evaluation 06/10/21    Authorization Type UHC [place]    Progress Note Due on Visit 10    PT Start Time 1403    PT Stop Time 0102    PT Time Calculation (min) 45 min    Activity Tolerance Patient tolerated treatment well    Behavior During Therapy [facility] for tasks assessed/performed             Past Medical History:  Diagnosis Date   Complication of anesthesia    post op nausea and vomiting   Diabetes ([place])    Type 2   History of echocardiogram 10/2012   Echo 4/14:  EF 60-65%, mild LAE, MAC, impaired relaxation   Hypertension    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   PAF (paroxysmal atrial fibrillation) ([place])     Past Surgical History:  Procedure Laterality Date   BIOPSY  04/05/2019   Procedure: BIOPSY;  Surgeon: Wonda Horner, MD;  Location: [place];  Service: Endoscopy;;   BREAST LUMPECTOMY  1992   Left breast   CARPAL TUNNEL RELEASE     Left hand 05/2012 and right hand dec 2013   COLONOSCOPY WITH PROPOFOL N/A 04/05/2019   Procedure: COLONOSCOPY WITH PROPOFOL;  Surgeon: Wonda Horner, MD;  Location: [facility] ENDOSCOPY;  Service: Endoscopy;  Laterality: N/A;   ESOPHAGOGASTRODUODENOSCOPY (EGD) WITH PROPOFOL N/A 04/05/2019   Procedure: ESOPHAGOGASTRODUODENOSCOPY (EGD) WITH PROPOFOL;  Surgeon: Wonda Horner, MD;  Location: [facility] ENDOSCOPY;  Service: Endoscopy;  Laterality: N/A;   TOTAL KNEE ARTHROPLASTY  07/2009   Left knee   TRIGGER FINGER RELEASE  2015   Left thumb    There were no vitals filed for this visit.   Subjective Assessment - 05/13/21 1406      Subjective Patient reports she is doing well overall but her right hand has been really bothersome today.    Limitations Lifting    How long can you sit comfortably? unlimited    How long can you stand comfortably? 10 min    How long can you walk comfortably? 10 min    Patient Stated Goals To gain upper body strength and be able to do things with her hands with less strain.    Pain Onset More than a month ago                               [facility] Adult PT Treatment/Exercise - 05/13/21 0001       Exercises   Exercises Elbow      Elbow Exercises   Elbow Flexion Strengthening;Both;10 reps;Seated;Theraband;Other (comment)   2 sets of 10   Theraband Level (Elbow Flexion) Other (comment)   green loop open palm     Neck Exercises: Machines for Strengthening   UBE (Upper Arm Bike) seat 10 L1 x 5 min      Neck Exercises: Standing   Neck Retraction 20 reps;Other (comment)   at wall aiming toward wall   Other Standing Exercises shoulder retraction x  20 against wall      Shoulder Exercises: Seated   Theraband Level (Shoulder ABduction) Level 1 (Yellow)   2 x 10,  switched to green loop for all exercises and modified due to trigger finger pain on right   Other Seated Exercises Bilateral shoulder ER with green loop 2 x 10    Other Seated Exercises diagonals with green loop      Shoulder Exercises: Prone   Extension Strengthening;Both;20 reps   fwd bent, no weight   Horizontal ABduction 1 Strengthening;Both;20 reps   fwd bent   Other Prone Exercises fwd bent row both x 20 no weight      Shoulder Exercises: Standing   Other Standing Exercises lower trap lift off 2 x 10                       PT Short Term Goals - 04/29/21 1519       PT SHORT TERM GOAL #1   Title Independence with HEP    Status New    Target Date 05/20/21      PT SHORT TERM GOAL #2   Title Pain no greater than 5/10               PT Long Term Goals - 04/29/21 1656       PT  LONG TERM GOAL #1   Title Patient to be independent with advanced HEP    Time 6    Period Weeks    Status New    Target Date 06/10/21      PT LONG TERM GOAL #2   Title Patient to report pain no greater than 2/10 with routine daily activities to allow increased ease with dressing, bathing and hygiene.    Baseline 6/10 difficulty with ADL's    Time 6    Period Weeks    Status New    Target Date 06/10/21      PT LONG TERM GOAL #3   Title Patient to be more active in the community and attend get togethers with friends    Baseline Reluctant to attend social activities.    Time 6    Period Weeks    Status New    Target Date 06/10/21                   Plan - 05/13/21 1440     Clinical Impression Statement Patient seems to be responding well to current plan with regard to posture and overall upper body strength but having some flare up of her right trigger finger issues.  We modified all activities today to avoid excessive gripping.  We added lower trap lift off and postural training at the wall which she completed with good tolerance.  She continues to demonstrate poor posture and has multiple orthopedic issues which interfere with progress,  However, she is well motivated and compliant.  She would benefit from continued skilled PT for modified upper body and postural strengthening to allow her to be able to perform basic daily functional tasks with less fatigue and without pain.    Personal Factors and Comorbidities Comorbidity 1;Comorbidity 2;Comorbidity 3+    Comorbidities A fib, osteoporosis, OA    Examination-Activity Limitations Hygiene/Grooming;Lift;Carry;Dressing    Examination-Participation Restrictions Cleaning;Community Activity;Meal Prep    Stability/Clinical Decision Making Evolving/Moderate complexity    Clinical Decision Making Moderate    Rehab Potential Good    PT Frequency 2x / week    PT Duration 6  weeks    PT Treatment/Interventions ADLs/Self Care Home  Management;Cryotherapy;Ultrasound;Traction;Moist Heat;Iontophoresis 64m/ml Dexamethasone;Electrical Stimulation;Neuromuscular re-education;Therapeutic exercise;Therapeutic activities;Patient/family education;Manual techniques;Passive range of motion;Dry needling;Splinting;Taping;Spinal Manipulations;Vasopneumatic Device    PT Next Visit Plan Progress UE and postural strengthening.    PT Home Exercise Plan Access Code: [email or web address] URL: [email or web address]  Date: 05/13/2021  Prepared by: JCandyce Churn   Exercises  Standing Shoulder Extension with Resistance - 1 x daily - 7 x weekly - 2 sets - 10 reps  Standing Row with Resistance - 1 x daily - 7 x weekly - 3 sets - 10 reps  Seated Single Arm Bicep Curls with Rotation and Dumbbell - 1 x daily - 7 x weekly - 2 sets - 10 reps  Seated Elbow Extension with Self-Anchored Resistance - 1 x daily - 7 x weekly - 2 sets - 10 reps  Seated Cervical Retraction - 2 x daily - 7 x weekly - 1 sets - 10 reps  Seated Cervical Retraction and Rotation - 2 x daily - 7 x weekly - 1 sets - 10 reps  Standing Cervical Retraction with Sidebending - 2 x daily - 7 x weekly - 1 sets - 10 reps  Seated Upper Trapezius Stretch - 2 x daily - 7 x weekly - 1 sets - 10 reps  Low Trap Setting at [place]- 1 x daily - 7 x weekly - 3 sets - 10 reps    Consulted and Agree with Plan of Care Patient             Patient will benefit from skilled therapeutic intervention in order to improve the following deficits and impairments:  Hypomobility, Increased muscle spasms, Decreased range of motion, Impaired perceived functional ability, Decreased activity tolerance, Decreased strength, Increased fascial restricitons, Pain  Visit Diagnosis: Stiffness of cervical spine  Other muscle spasm  Muscle weakness (generalized)     Problem List Patient Active Problem List   Diagnosis Date Noted   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   PVD (posterior vitreous detachment), left  eye 12/03/2020   Vitreomacular traction syndrome of right eye 05/22/2020   Macular retinoschisis of right eye 05/22/2020   Nuclear sclerotic cataract of right eye 04/17/2020   Nuclear sclerotic cataract of left eye 04/17/2020   Hypertensive heart disease without heart failure 10/20/2017   Hyperlipidemia 07/31/2015   Controlled type 2 diabetes mellitus without complication, with long-term current use of insulin ([place] 06/25/2015   Atrial fibrillation ([place] 09/11/2013   Long term current use of anticoagulant therapy 09/11/2013   Obesity 09/11/2013   Essential hypertension, benign 09/11/2013    JAnderson MaltaB. Juliana Boling, PT 11/08/222:59 PM   [place]@ [place] [address]Phone: [phone]  Fax:  [phone] Name: Carrie DEANGELOMRN: [id number] of Birth: 5June 23, 1947

## 2021-05-14 ENCOUNTER — Other Ambulatory Visit: Payer: Self-pay

## 2021-05-14 ENCOUNTER — Other Ambulatory Visit: Payer: Medicare Other

## 2021-05-14 DIAGNOSIS — R601 Generalized edema: Secondary | ICD-10-CM

## 2021-05-15 ENCOUNTER — Other Ambulatory Visit: Payer: Self-pay

## 2021-05-15 ENCOUNTER — Ambulatory Visit: Payer: Medicare Other

## 2021-05-15 ENCOUNTER — Other Ambulatory Visit: Payer: Medicare Other | Admitting: *Deleted

## 2021-05-15 DIAGNOSIS — M436 Torticollis: Secondary | ICD-10-CM

## 2021-05-15 DIAGNOSIS — M6281 Muscle weakness (generalized): Secondary | ICD-10-CM

## 2021-05-15 DIAGNOSIS — M62838 Other muscle spasm: Secondary | ICD-10-CM

## 2021-05-15 DIAGNOSIS — R601 Generalized edema: Secondary | ICD-10-CM

## 2021-05-15 NOTE — Therapy (Signed)
[facility] @ [address] [place], [place], [address] Phone: [phone]   Fax:  [phone]  Physical Therapy Treatment  Patient Details  Name: Carrie Newton MRN: [id number] Date of Birth: [birth year] Referring Provider (PT): Delrae Rend, MD   Encounter Date: 05/15/2021   PT End of Session - 05/15/21 1621     Visit Number 5    Number of Visits 12    Date for PT Re-Evaluation 06/10/21    Authorization Type [employer] MC    Progress Note Due on Visit 10    PT Start Time 2836    PT Stop Time 6294    PT Time Calculation (min) 41 min    Activity Tolerance Patient tolerated treatment well    Behavior During Therapy [facility] for tasks assessed/performed             Past Medical History:  Diagnosis Date   Complication of anesthesia    post op nausea and vomiting   Diabetes ([place])    Type 2   History of echocardiogram 10/2012   Echo 4/14:  EF 60-65%, mild LAE, MAC, impaired relaxation   Hypertension    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   PAF (paroxysmal atrial fibrillation) ([place])     Past Surgical History:  Procedure Laterality Date   BIOPSY  04/05/2019   Procedure: BIOPSY;  Surgeon: Wonda Horner, MD;  Location: [facility];  Service: Endoscopy;;   BREAST LUMPECTOMY  1992   Left breast   CARPAL TUNNEL RELEASE     Left hand 05/2012 and right hand dec 2013   COLONOSCOPY WITH PROPOFOL N/A 04/05/2019   Procedure: COLONOSCOPY WITH PROPOFOL;  Surgeon: Wonda Horner, MD;  Location[facility] ENDOSCOPY;  Service: Endoscopy;  Laterality: N/A;   ESOPHAGOGASTRODUODENOSCOPY (EGD) WITH PROPOFOL N/A 04/05/2019   Procedure: ESOPHAGOGASTRODUODENOSCOPY (EGD) WITH PROPOFOL;  Surgeon: Wonda Horner, MD;  Location: [facility] ENDOSCOPY;  Service: Endoscopy;  Laterality: N/A;   TOTAL KNEE ARTHROPLASTY  07/2009   Left knee   TRIGGER FINGER RELEASE  2015   Left thumb    There were no vitals filed for this visit.   Subjective Assessment - 05/15/21 1408      Subjective Patient states she is doing well.  The right hand is still painful and she is hoping to do all activities modified again today.  She is interested in adding the shoulder exercises that we did last visit to her HEP.  She states her shoulders are a little sore but no pain.    Limitations Lifting    How long can you sit comfortably? unlimited    How long can you stand comfortably? 10 min    How long can you walk comfortably? 10 min    Patient Stated Goals To gain upper body strength and be able to do things with her hands with less strain.    Currently in Pain? No/denies    Pain Onset More than a month ago                               [facility] Adult PT Treatment/Exercise - 05/15/21 0001       Elbow Exercises   Elbow Flexion Strengthening;Both;10 reps;Seated;Theraband;Other (comment)   2 sets of 10   Theraband Level (Elbow Flexion) Other (comment)   green loop open palm   Elbow Extension Both;10 reps;Other reps (comment)   Did with red tband today/ 2 sets  Neck Exercises: Machines for Strengthening   UBE (Upper Arm Bike) seat 10 L1 x 5 min      Neck Exercises: Standing   Neck Retraction 20 reps;Other (comment)   at wall aiming toward wall   Other Standing Exercises shoulder retraction x 20 against wall      Shoulder Exercises: Seated   Theraband Level (Shoulder ABduction) Level 1 (Yellow)   2 x 10,  switched to green loop for all exercises and modified due to trigger finger pain on right   Other Seated Exercises Bilateral shoulder ER with green loop 2 x 10    Other Seated Exercises diagonals with green loop      Shoulder Exercises: Prone   Extension Strengthening;Both;20 reps   fwd bent, no weight   Horizontal ABduction 1 Strengthening;Both;20 reps   fwd bent   Other Prone Exercises fwd bent row both x 20 no weight      Shoulder Exercises: Standing   Theraband Level (Shoulder Extension) Level 2 (Red)   standing band with handles 2 x 10   Theraband  Level (Shoulder Row) Level 2 (Red)   band with handles, standing 2 x 10   Other Standing Exercises lower trap lift off 2 x 10    Other Standing Exercises UE band walks 5 steps x 3 laps at counter                     PT Education - 05/15/21 1453     Education Details Added UE band walks and              PT Short Term Goals - 04/29/21 1519       PT SHORT TERM GOAL #1   Title Independence with HEP    Status New    Target Date 05/20/21      PT SHORT TERM GOAL #2   Title Pain no greater than 5/10               PT Long Term Goals - 04/29/21 1656       PT LONG TERM GOAL #1   Title Patient to be independent with advanced HEP    Time 6    Period Weeks    Status New    Target Date 06/10/21      PT LONG TERM GOAL #2   Title Patient to report pain no greater than 2/10 with routine daily activities to allow increased ease with dressing, bathing and hygiene.    Baseline 6/10 difficulty with ADL's    Time 6    Period Weeks    Status New    Target Date 06/10/21      PT LONG TERM GOAL #3   Title Patient to be more active in the community and attend get togethers with friends    Baseline Reluctant to attend social activities.    Time 6    Period Weeks    Status New    Target Date 06/10/21                   Plan - 05/15/21 1622     Clinical Impression Statement Patient is making steady progress despite need to modify exercises due to hand issues.  She is able to tolerate more activity in standing and increased resistance for UE exercises.  She appears to be compliant with her HEP.  She inquires each visit about proper technique.  She will benefit from continued skilled PT for UE  and postural strength.    Personal Factors and Comorbidities Comorbidity 1;Comorbidity 2;Comorbidity 3+    Comorbidities A fib, osteoporosis, OA    Examination-Activity Limitations Hygiene/Grooming;Lift;Carry;Dressing    Examination-Participation Restrictions  Cleaning;Community Activity;Meal Prep    Stability/Clinical Decision Making Evolving/Moderate complexity    Clinical Decision Making Moderate    Rehab Potential Good    PT Frequency 2x / week    PT Duration 6 weeks    PT Treatment/Interventions ADLs/Self Care Home Management;Cryotherapy;Ultrasound;Traction;Moist Heat;Iontophoresis 34m/ml Dexamethasone;Electrical Stimulation;Neuromuscular re-education;Therapeutic exercise;Therapeutic activities;Patient/family education;Manual techniques;Passive range of motion;Dry needling;Splinting;Taping;Spinal Manipulations;Vasopneumatic Device    PT Next Visit Plan Progress UE and postural strengthening as tolerated.  Band walks on wall vs. tabletop.    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   PVD (posterior vitreous detachment), left eye 12/03/2020   Vitreomacular traction syndrome of right eye 05/22/2020   Macular retinoschisis of right eye 05/22/2020   Nuclear sclerotic cataract of right eye 04/17/2020   Nuclear sclerotic cataract of left eye 04/17/2020   Hypertensive heart disease without heart failure 10/20/2017   Hyperlipidemia 07/31/2015   Controlled type 2 diabetes mellitus without complication, with long-term current use of insulin ([place] 06/25/2015   Atrial fibrillation ([place] 09/11/2013   Long term current use of anticoagulant therapy 09/11/2013    Obesity 09/11/2013   Essential hypertension, benign 09/11/2013   JAnderson MaltaB. Yoona Ishii, PT 11/10/224:27 PM   [facility] [place] [address]Phone: [phone]  Fax:  [phone] Name: BKINDLE STROHMEIERMRN: [id number] of Birth: [birth year]

## 2021-05-15 NOTE — Patient Instructions (Addendum)
Continue current HEP with modifications to protect hands Access Code: [email or web address] URL: [email or web address] Date: 05/15/2021 Prepared by: Candyce Churn  Exercises Standing Shoulder Extension with Resistance - 1 x daily - 7 x weekly - 2 sets - 10 reps Standing Row with Resistance - 1 x daily - 7 x weekly - 3 sets - 10 reps Seated Single Arm Bicep Curls with Rotation and Dumbbell - 1 x daily - 7 x weekly - 2 sets - 10 reps Seated Elbow Extension with Self-Anchored Resistance - 1 x daily - 7 x weekly - 2 sets - 10 reps Seated Cervical Retraction - 2 x daily - 7 x weekly - 1 sets - 10 reps Seated Cervical Retraction and Rotation - 2 x daily - 7 x weekly - 1 sets - 10 reps Standing Cervical Retraction with Sidebending - 2 x daily - 7 x weekly - 1 sets - 10 reps Seated Upper Trapezius Stretch - 2 x daily - 7 x weekly - 1 sets - 10 reps Low Trap Setting at Wall - 1 x daily - 7 x weekly - 3 sets - 10 reps Single Arm Bent Over Shoulder Extension with Dumbbell - 1 x daily - 7 x weekly - 2 sets - 10 reps Standing Bent Over Single Arm Scapular Row with Table Support - 1 x daily - 7 x weekly - 2 sets - 10 reps Single Arm Bent Over Shoulder Horizontal Abduction with Dumbbell - Palm Down - 1 x daily - 7 x weekly - 2 sets - 10 reps Horizontal Wall Walk with Resistance - 1 x daily - 7 x weekly - 2 sets - 5 reps

## 2021-05-16 LAB — BASIC METABOLIC PANEL
BUN/Creatinine Ratio: 23 (ref 12–28)
BUN: 18 mg/dL (ref 8–27)
CO2: 21 mmol/L (ref 20–29)
Calcium: 8.9 mg/dL (ref 8.7–10.3)
Chloride: 103 mmol/L (ref 96–106)
Creatinine, Ser: 0.79 mg/dL (ref 0.57–1.00)
Glucose: 223 mg/dL — ABNORMAL HIGH (ref 70–99)
Potassium: 4.8 mmol/L (ref 3.5–5.2)
Sodium: 140 mmol/L (ref 134–144)
eGFR: 78 mL/min/{1.73_m2} (ref >59)

## 2021-05-16 LAB — PRO B NATRIURETIC PEPTIDE: NT-Pro BNP: 276 pg/mL (ref 0–301)

## 2021-05-16 LAB — SPECIMEN STATUS REPORT

## 2021-05-19 ENCOUNTER — Other Ambulatory Visit: Payer: Self-pay

## 2021-05-19 ENCOUNTER — Ambulatory Visit (HOSPITAL_COMMUNITY): Payer: Medicare Other | Attending: Internal Medicine

## 2021-05-19 DIAGNOSIS — R0602 Shortness of breath: Secondary | ICD-10-CM | POA: Diagnosis present

## 2021-05-19 DIAGNOSIS — R601 Generalized edema: Secondary | ICD-10-CM | POA: Insufficient documentation

## 2021-05-19 DIAGNOSIS — I480 Paroxysmal atrial fibrillation: Secondary | ICD-10-CM

## 2021-05-19 DIAGNOSIS — Z79899 Other long term (current) drug therapy: Secondary | ICD-10-CM | POA: Diagnosis present

## 2021-05-19 LAB — ECHOCARDIOGRAM COMPLETE
Area-P 1/2: 3.54 cm2
S' Lateral: 3.2 cm

## 2021-05-20 ENCOUNTER — Telehealth: Payer: Self-pay | Admitting: Interventional Cardiology

## 2021-05-20 NOTE — Telephone Encounter (Signed)
I spoke with patient and reviewed echo and lab results with her.

## 2021-05-20 NOTE — Telephone Encounter (Signed)
Pt is returning call for Echo results. Please call pt back at [phone]

## 2021-05-22 ENCOUNTER — Other Ambulatory Visit: Payer: Self-pay

## 2021-05-22 ENCOUNTER — Ambulatory Visit: Payer: Medicare Other

## 2021-05-22 DIAGNOSIS — M436 Torticollis: Secondary | ICD-10-CM | POA: Diagnosis not present

## 2021-05-22 DIAGNOSIS — M6281 Muscle weakness (generalized): Secondary | ICD-10-CM

## 2021-05-22 DIAGNOSIS — M62838 Other muscle spasm: Secondary | ICD-10-CM

## 2021-05-22 NOTE — Patient Instructions (Signed)
Continue HEP.  Avoid sitting with fwd head and rounded shoulders as with falling asleep in her chair.

## 2021-05-22 NOTE — Therapy (Signed)
[facility] @ [address] [place], [place], [address] Phone: [phone]   Fax:  [phone]  Physical Therapy Treatment  Patient Details  Name: Carrie Newton MRN: [id number] Date of Birth: 11-25-1945 Referring Provider (PT): Delrae Rend, MD   Encounter Date: 05/22/2021   PT End of Session - 05/22/21 1408     Visit Number 6    Number of Visits 12    Date for PT Re-Evaluation 06/10/21    Authorization Type [employer] MC    Progress Note Due on Visit 10    PT Start Time 1401    PT Stop Time 1441    PT Time Calculation (min) 40 min    Activity Tolerance Patient tolerated treatment well    Behavior During Therapy [facility] for tasks assessed/performed             Past Medical History:  Diagnosis Date   Complication of anesthesia    post op nausea and vomiting   Diabetes ([place])    Type 2   History of echocardiogram 10/2012   Echo 4/14:  EF 60-65%, mild LAE, MAC, impaired relaxation   Hypertension    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   PAF (paroxysmal atrial fibrillation) ([place])     Past Surgical History:  Procedure Laterality Date   BIOPSY  04/05/2019   Procedure: BIOPSY;  Surgeon: Wonda Horner, MD;  Location: [facility];  Service: Endoscopy;;   BREAST LUMPECTOMY  1992   Left breast   CARPAL TUNNEL RELEASE     Left hand 05/2012 and right hand dec 2013   COLONOSCOPY WITH PROPOFOL N/A 04/05/2019   Procedure: COLONOSCOPY WITH PROPOFOL;  Surgeon: Wonda Horner, MD;  Location: [facility] ENDOSCOPY;  Service: Endoscopy;  Laterality: N/A;   ESOPHAGOGASTRODUODENOSCOPY (EGD) WITH PROPOFOL N/A 04/05/2019   Procedure: ESOPHAGOGASTRODUODENOSCOPY (EGD) WITH PROPOFOL;  Surgeon: Wonda Horner, MD;  Location: [facility] ENDOSCOPY;  Service: Endoscopy;  Laterality: N/A;   TOTAL KNEE ARTHROPLASTY  07/2009   Left knee   TRIGGER FINGER RELEASE  2015   Left thumb    There were no vitals filed for this visit.   Subjective Assessment - 05/22/21 1407      Subjective Patient states she had a bad day on Tuesday due to the bad weather.  She explains that she always gets pain all over on a cold rainy day.  She summarizes her overall progress a    Limitations Lifting    How long can you sit comfortably? unlimited    How long can you stand comfortably? 10 min    How long can you walk comfortably? 30 min    Patient Stated Goals To gain upper body strength and be able to do things with her hands with less strain.    Currently in Pain? Yes    Pain Score 5     Pain Location Hand    Pain Orientation Right;Left    Pain Descriptors / Indicators Aching;Nagging    Pain Type Chronic pain    Pain Onset More than a month ago    Pain Frequency Constant                               [facility] Adult PT Treatment/Exercise - 05/22/21 0001       Elbow Exercises   Elbow Flexion Strengthening;Both;10 reps;Seated;Theraband;Other (comment)   2 sets of 10   Theraband Level (Elbow Flexion) Level 1 (  Yellow)   yellow band with loop- palm open   Elbow Extension Both;10 reps;Other reps (comment)   Did with red tband today/ 2 sets   Theraband Level (Elbow Extension) Level 1 (Yellow)      Neck Exercises: Machines for Strengthening   Nustep seat 10, L1 x 5 min      Neck Exercises: Standing   Neck Retraction 20 reps;Other (comment)   seated   Other Standing Exercises shoulder retraction x 20 against wall      Shoulder Exercises: Seated   Other Seated Exercises Bilateral shoulder ER with green loop 2 x 10    Other Seated Exercises diagonals with green loop                     PT Education - 05/22/21 1429     Education Details Educated on posture in sitting avoiding fwd head and rounded shoulders.    Person(s) Educated Patient    Methods Explanation;Demonstration    Comprehension Verbalized understanding              PT Short Term Goals - 05/22/21 1409       PT SHORT TERM GOAL #1   Title Independence with HEP    Status  Achieved    Target Date 05/20/21      PT SHORT TERM GOAL #2   Title Pain no greater than 5/10: patient states that her pain in her hands is at best 5/10 and at worst 9/10               PT Long Term Goals - 04/29/21 1656       PT LONG TERM GOAL #1   Title Patient to be independent with advanced HEP    Time 6    Period Weeks    Status New    Target Date 06/10/21      PT LONG TERM GOAL #2   Title Patient to report pain no greater than 2/10 with routine daily activities to allow increased ease with dressing, bathing and hygiene.    Baseline 6/10 difficulty with ADL's    Time 6    Period Weeks    Status New    Target Date 06/10/21      PT LONG TERM GOAL #3   Title Patient to be more active in the community and attend get togethers with friends    Baseline Reluctant to attend social activities.    Time 6    Period Weeks    Status New    Target Date 06/10/21                   Plan - 05/22/21 1434     Clinical Impression Statement Patient's treatment is limited by hand pain bilaterally.  She is tolerating  upper body strengthening with min fatigue but her hands become quite painful if we try to do any activities that require gripping.    Personal Factors and Comorbidities Comorbidity 1;Comorbidity 2;Comorbidity 3+    Comorbidities A fib, osteoporosis, OA    Examination-Activity Limitations Hygiene/Grooming;Lift;Carry;Dressing    Examination-Participation Restrictions Cleaning;Community Activity;Meal Prep    Stability/Clinical Decision Making Evolving/Moderate complexity    Clinical Decision Making Moderate    Rehab Potential Good    PT Frequency 2x / week    PT Duration 6 weeks    PT Treatment/Interventions ADLs/Self Care Home Management;Cryotherapy;Ultrasound;Traction;Moist Heat;Iontophoresis 54m/ml Dexamethasone;Electrical Stimulation;Neuromuscular re-education;Therapeutic exercise;Therapeutic activities;Patient/family education;Manual techniques;Passive range  of motion;Dry needling;Splinting;Taping;Spinal Manipulations;Vasopneumatic Device  PT Next Visit Plan Progress UE and postural strengthening as tolerated.  Band walks on wall vs. tabletop.    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   PVD (posterior vitreous detachment), left eye 12/03/2020   Vitreomacular traction syndrome of right eye 05/22/2020   Macular retinoschisis of right eye 05/22/2020   Nuclear sclerotic cataract of right eye 04/17/2020   Nuclear sclerotic cataract of left eye 04/17/2020   Hypertensive heart disease without heart failure 10/20/2017   Hyperlipidemia 07/31/2015   Controlled type 2 diabetes mellitus without complication, with long-term current use of insulin ([place]) 06/25/2015   Atrial fibrillation ([place]) 09/11/2013   Long term current use of anticoagulant therapy 09/11/2013   Obesity 09/11/2013   Essential hypertension, benign 09/11/2013    Anderson Malta B. Quinta Eimer, PT11/17/222:44 PM   [facility] @ [place], [place], [address] Phone: [phone]   Fax:  [phone]  Name: Carrie Newton MRN: [id number] Date of Birth: 06-23-1946

## 2021-06-03 ENCOUNTER — Other Ambulatory Visit: Payer: Self-pay

## 2021-06-03 ENCOUNTER — Ambulatory Visit: Payer: Medicare Other

## 2021-06-03 DIAGNOSIS — M6281 Muscle weakness (generalized): Secondary | ICD-10-CM

## 2021-06-03 DIAGNOSIS — M436 Torticollis: Secondary | ICD-10-CM

## 2021-06-03 DIAGNOSIS — M62838 Other muscle spasm: Secondary | ICD-10-CM

## 2021-06-03 NOTE — Therapy (Signed)
[place] @ [place], [place], [address] Phone: [phone]   Fax:  [phone]  Physical Therapy Treatment  Patient Details  Name: Carrie Newton MRN: [id number] Date of Birth: 1945/10/04 Referring Provider (PT): Delrae Rend, MD   Encounter Date: 06/03/2021   PT End of Session - 06/03/21 1419     Visit Number 7    Number of Visits 12    Date for PT Re-Evaluation 06/10/21    Authorization Type U[employer] MC    PT Start Time 1400    Activity Tolerance Patient tolerated treatment well    Behavior During Therapy [facility] for tasks assessed/performed             Past Medical History:  Diagnosis Date   Complication of anesthesia    post op nausea and vomiting   Diabetes ([place])    Type 2   History of echocardiogram 10/2012   Echo 4/14:  EF 60-65%, mild LAE, MAC, impaired relaxation   Hypertension    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   PAF (paroxysmal atrial fibrillation) [facility])     Past Surgical History:  Procedure Laterality Date   BIOPSY  04/05/2019   Procedure: BIOPSY;  Surgeon: Wonda Horner, MD;  Location: [place];  Service: Endoscopy;;   BREAST LUMPECTOMY  1992   Left breast   CARPAL TUNNEL RELEASE     Left hand 05/2012 and right hand dec 2013   COLONOSCOPY WITH PROPOFOL N/A 04/05/2019   Procedure: COLONOSCOPY WITH PROPOFOL;  Surgeon: Wonda Horner, MD;  Location: [facility] ENDOSCOPY;  Service: Endoscopy;  Laterality: N/A;   ESOPHAGOGASTRODUODENOSCOPY (EGD) WITH PROPOFOL N/A 04/05/2019   Procedure: ESOPHAGOGASTRODUODENOSCOPY (EGD) WITH PROPOFOL;  Surgeon: Wonda Horner, MD;  Location: [facility] ENDOSCOPY;  Service: Endoscopy;  Laterality: N/A;   TOTAL KNEE ARTHROPLASTY  07/2009   Left knee   TRIGGER FINGER RELEASE  2015   Left thumb    There were no vitals filed for this visit.   Subjective Assessment - 06/03/21 1405     Subjective Patient states she had injections in her hands.  She had no other complaints.     Limitations Lifting    How long can you sit comfortably? unlimited    How long can you stand comfortably? 10 min    How long can you walk comfortably? 30 min    Patient Stated Goals To gain upper body strength and be able to do things with her hands with less strain.    Pain Onset More than a month ago                               [facility] Adult PT Treatment/Exercise - 06/03/21 0001       Exercises   Exercises Knee/Hip      Elbow Exercises   Elbow Flexion Strengthening;Both;10 reps;Bar weights/barbell    Bar Weights/Barbell (Elbow Flexion) 2 lbs      Neck Exercises: Machines for Strengthening   Nustep seat 10, L1 x 5 min      Knee/Hip Exercises: Seated   Long Arc Quad Strengthening;Both;1 set;10 reps    Long Arc Quad Weight 0 lbs.    Marching Strengthening;Both;1 set;20 reps      Shoulder Exercises: Standing   ABduction Both;Strengthening;20 reps;Weights    Shoulder ABduction Weight (lbs) 2 lbs    ABduction Limitations standing fwd bent    Theraband Level (Shoulder Extension) Level  2 (Red)   standing band with handles 2 x 10   Extension Weight (lbs) 2lbs fwd bent x 20    Row Strengthening;Both;20 reps    Theraband Level (Shoulder Row) Level 2 (Red)   band with handles, standing 2 x 10   Row Weight (lbs) 2 lbs    Other Standing Exercises lower trap lift off 2 x 10    Other Standing Exercises 3 way scapular exercises Green loop on wall x 5 each side                     PT Education - 06/03/21 1417     Education Details Reviewed HEP and correct sitting posture.    Person(s) Educated Patient    Methods Explanation;Demonstration    Comprehension Verbalized understanding              PT Short Term Goals - 05/22/21 1409       PT SHORT TERM GOAL #1   Title Independence with HEP    Status Achieved    Target Date 05/20/21      PT SHORT TERM GOAL #2   Title Pain no greater than 5/10: patient states that her pain in her hands is at best  5/10 and at worst 9/10               PT Long Term Goals - 04/29/21 1656       PT LONG TERM GOAL #1   Title Patient to be independent with advanced HEP    Time 6    Period Weeks    Status New    Target Date 06/10/21      PT LONG TERM GOAL #2   Title Patient to report pain no greater than 2/10 with routine daily activities to allow increased ease with dressing, bathing and hygiene.    Baseline 6/10 difficulty with ADL's    Time 6    Period Weeks    Status New    Target Date 06/10/21      PT LONG TERM GOAL #3   Title Patient to be more active in the community and attend get togethers with friends    Baseline Reluctant to attend social activities.    Time 6    Period Weeks    Status New    Target Date 06/10/21                   Plan - 06/03/21 1437     Clinical Impression Statement Patient is able to complete more tasks in clinic with less fatigue and pain.  Her knees seem to be primary limitation but she will be seeing Dr. Wynelle Link today for evaluation.  She is compliant wiht her HEP 75% of the time.  She would benefit from continued skilled PT for postural and UE strengthening.    Personal Factors and Comorbidities Comorbidity 1;Comorbidity 2;Comorbidity 3+    Comorbidities A fib, osteoporosis, OA    Examination-Activity Limitations Hygiene/Grooming;Lift;Carry;Dressing    Examination-Participation Restrictions Cleaning;Community Activity;Meal Prep    Stability/Clinical Decision Making Evolving/Moderate complexity    Clinical Decision Making Moderate    Rehab Potential Good    PT Frequency 2x / week    PT Duration 6 weeks    PT Treatment/Interventions ADLs/Self Care Home Management;Cryotherapy;Ultrasound;Traction;Moist Heat;Iontophoresis 27m/ml Dexamethasone;Electrical Stimulation;Neuromuscular re-education;Therapeutic exercise;Therapeutic activities;Patient/family education;Manual techniques;Passive range of motion;Dry needling;Splinting;Taping;Spinal  Manipulations;Vasopneumatic Device    PT Next Visit Plan Progress UE and postural strengthening as tolerated.  Band walks  on wall vs. tabletop.    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   PVD (posterior vitreous detachment), left eye 12/03/2020   Vitreomacular traction syndrome of right eye 05/22/2020   Macular retinoschisis of right eye 05/22/2020   Nuclear sclerotic cataract of right eye 04/17/2020   Nuclear sclerotic cataract of left eye 04/17/2020   Hypertensive heart disease without heart failure 10/20/2017   Hyperlipidemia 07/31/2015   Controlled type 2 diabetes mellitus without complication, with long-term current use of insulin ([place]) 06/25/2015   Atrial fibrillation ([place]) 09/11/2013   Long term current use of anticoagulant therapy 09/11/2013   Obesity 09/11/2013   Essential hypertension, benign 09/11/2013   Anderson Malta B. Lemonte Al, PT 11/29/225:01 PM   [place] @ [place], [place], [address] Phone: [phone]   Fax:  [phone]  Name: Carrie Newton MRN: [id number] Date of Birth: 1946-03-27

## 2021-06-03 NOTE — Patient Instructions (Signed)
Continue current HEP. Patient is quite limited in what she can do due to hand issues.

## 2021-06-05 ENCOUNTER — Other Ambulatory Visit: Payer: Self-pay

## 2021-06-05 ENCOUNTER — Ambulatory Visit: Payer: Medicare Other | Attending: Internal Medicine

## 2021-06-05 DIAGNOSIS — M436 Torticollis: Secondary | ICD-10-CM | POA: Diagnosis not present

## 2021-06-05 DIAGNOSIS — M62838 Other muscle spasm: Secondary | ICD-10-CM | POA: Insufficient documentation

## 2021-06-05 DIAGNOSIS — M6281 Muscle weakness (generalized): Secondary | ICD-10-CM | POA: Diagnosis present

## 2021-06-05 NOTE — Patient Instructions (Signed)
Added LE resistance program to HEP.  Access Code: [email or web address] URL: [email or web address] Date: 06/05/2021 Prepared by: Candyce Churn  Exercises Standing Shoulder Extension with Resistance - 1 x daily - 7 x weekly - 2 sets - 10 reps Standing Row with Resistance - 1 x daily - 7 x weekly - 3 sets - 10 reps Seated Single Arm Bicep Curls with Rotation and Dumbbell - 1 x daily - 7 x weekly - 2 sets - 10 reps Seated Elbow Extension with Self-Anchored Resistance - 1 x daily - 7 x weekly - 2 sets - 10 reps Seated Cervical Retraction - 2 x daily - 7 x weekly - 1 sets - 10 reps Seated Cervical Retraction and Rotation - 2 x daily - 7 x weekly - 1 sets - 10 reps Standing Cervical Retraction with Sidebending - 2 x daily - 7 x weekly - 1 sets - 10 reps Seated Upper Trapezius Stretch - 2 x daily - 7 x weekly - 1 sets - 10 reps Low Trap Setting at Wall - 1 x daily - 7 x weekly - 3 sets - 10 reps Single Arm Bent Over Shoulder Extension with Dumbbell - 1 x daily - 7 x weekly - 2 sets - 10 reps Standing Bent Over Single Arm Scapular Row with Table Support - 1 x daily - 7 x weekly - 2 sets - 10 reps Single Arm Bent Over Shoulder Horizontal Abduction with Dumbbell - Palm Down - 1 x daily - 7 x weekly - 2 sets - 10 reps Horizontal Wall Walk with Resistance - 1 x daily - 7 x weekly - 2 sets - 5 reps Seated Long Arc Quad with Ankle Weight - 1 x daily - 7 x weekly - 2 sets - 10 reps Seated March - 2 x daily - 7 x weekly - 1 sets - 20 reps Supine Quadricep Sets - 2 x daily - 7 x weekly - 2 sets - 10 reps Supine Straight Leg Raises - 2 x daily - 7 x weekly - 2 sets - 10 reps Supine Hip Abduction - 2 x daily - 7 x weekly - 2 sets - 10 reps Supine Knee Extension Strengthening - 2 x daily - 7 x weekly - 2 sets - 10 reps

## 2021-06-05 NOTE — Therapy (Signed)
[place] @ [place], [place], [address] Phone: [phone]   Fax:  [phone]  Physical Therapy Treatment  Patient Details  Name: Carrie Newton MRN: [id number] Date of Birth: 06-11-1946 Referring Provider (PT): Delrae Rend, MD   Encounter Date: 06/05/2021   PT End of Session - 06/05/21 1442     Visit Number 8    Date for PT Re-Evaluation 06/10/21    Authorization Type [employer] MC    PT Start Time 1400    PT Stop Time 1450    PT Time Calculation (min) 50 min    Activity Tolerance Patient tolerated treatment well    Behavior During Therapy [facility] for tasks assessed/performed             Past Medical History:  Diagnosis Date   Complication of anesthesia    post op nausea and vomiting   Diabetes ([place])    Type 2   History of echocardiogram 10/2012   Echo 4/14:  EF 60-65%, mild LAE, MAC, impaired relaxation   Hypertension    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   PAF (paroxysmal atrial fibrillation) [facility])     Past Surgical History:  Procedure Laterality Date   BIOPSY  04/05/2019   Procedure: BIOPSY;  Surgeon: Wonda Horner, MD;  Location: [facility] ENDOSCOPY;  Service: Endoscopy;;   BREAST LUMPECTOMY  1992   Left breast   CARPAL TUNNEL RELEASE     Left hand 05/2012 and right hand dec 2013   COLONOSCOPY WITH PROPOFOL N/A 04/05/2019   Procedure: COLONOSCOPY WITH PROPOFOL;  Surgeon: Wonda Horner, MD;  Location: [facility] ENDOSCOPY;  Service: Endoscopy;  Laterality: N/A;   ESOPHAGOGASTRODUODENOSCOPY (EGD) WITH PROPOFOL N/A 04/05/2019   Procedure: ESOPHAGOGASTRODUODENOSCOPY (EGD) WITH PROPOFOL;  Surgeon: Wonda Horner, MD;  Location: [facility] ENDOSCOPY;  Service: Endoscopy;  Laterality: N/A;   TOTAL KNEE ARTHROPLASTY  07/2009   Left knee   TRIGGER FINGER RELEASE  2015   Left thumb    There were no vitals filed for this visit.   Subjective Assessment - 06/05/21 1408     Subjective Patient states no problems from last session.   Her right hand seems to be improving with recent injection.  Left thumb is still uncomfortable but she is able to grip items with increased ease. She had appt with orthopedic surgeon to discuss knee pain.  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  She will have to lose 30 lbs and her A1C will have to be below 7.5.    Limitations Lifting    How long can you sit comfortably? unlimited    How long can you stand comfortably? 10 min    How long can you walk comfortably? 30 min    Patient Stated Goals To gain upper body strength and be able to do things with her hands with less strain.    Currently in Pain? No/denies    Pain Onset More than a month ago                               [facility] Adult PT Treatment/Exercise - 06/05/21 0001       Knee/Hip Exercises: Aerobic   Nustep Level 1 x 5 min      Knee/Hip Exercises: Seated   Long Arc Quad Strengthening;Both;1 set;10 reps    Long Arc Quad Weight 1 lbs.    Marching Strengthening;Both;1 set;20 reps  Marching Limitations 1 lb ankle weight bilaterally                     PT Education - 06/05/21 1435     Education Details Educated patient on knee health and alignment.  Importance of hip strength.              PT Short Term Goals - 05/22/21 1409       PT SHORT TERM GOAL #1   Title Independence with HEP    Status Achieved    Target Date 05/20/21      PT SHORT TERM GOAL #2   Title Pain no greater than 5/10: patient states that her pain in her hands is at best 5/10 and at worst 9/10               PT Long Term Goals - 04/29/21 1656       PT LONG TERM GOAL #1   Title Patient to be independent with advanced HEP    Time 6    Period Weeks    Status New    Target Date 06/10/21      PT LONG TERM GOAL #2   Title Patient to report pain no greater than 2/10 with routine daily activities to allow increased ease with dressing, bathing and hygiene.    Baseline 6/10 difficulty with ADL's    Time 6     Period Weeks    Status New    Target Date 06/10/21      PT LONG TERM GOAL #3   Title Patient to be more active in the community and attend get togethers with friends    Baseline Reluctant to attend social activities.    Time 6    Period Weeks    Status New    Target Date 06/10/21                   Plan - 06/05/21 1659     Clinical Impression Statement Patient was able to tolerate LE exercises with minimal pain.  She had some irritation on short arc quad initially but with hip angle adjustment, she was able to complete all reps painfree.  She now has a goal in mind of losing the weight that was recommended by the PA in order to be able to do her knee replacements in the near future.  She will be receiving visco supplementation when approved by insurance.  She will also be having eye surgery soon.  She would like to be able to have a comprehensive LE exercise plan along with her other resistive exercises so that she can continue independently until she is able to have her injections.  She would like to resume PT if indicated by MD once she completes both eye surgeries and her injections to hopefully allow her to be able to exercise her LE's with minimal pain and begin a light walking program to help her lose the weight she is being required to lose for her knee replacements.    Personal Factors and Comorbidities Comorbidity 1;Comorbidity 2;Comorbidity 3+    Comorbidities A fib, osteoporosis, OA    Examination-Activity Limitations Hygiene/Grooming;Lift;Carry;Dressing    Examination-Participation Restrictions Cleaning;Community Activity;Meal Prep    Stability/Clinical Decision Making Evolving/Moderate complexity    Clinical Decision Making Moderate    Rehab Potential Good    PT Frequency 2x / week    PT Duration 6 weeks    PT Treatment/Interventions ADLs/Self Care Home Management;Cryotherapy;Ultrasound;Traction;Moist Heat;Iontophoresis 33m/ml  Dexamethasone;Electrical  Stimulation;Neuromuscular re-education;Therapeutic exercise;Therapeutic activities;Patient/family education;Manual techniques;Passive range of motion;Dry needling;Splinting;Taping;Spinal Manipulations;Vasopneumatic Device    PT Next Visit Plan DC next visit with HEP    PT Home Exercise Plan Access Code: [email or web address]    [email or web address] and Agree with Plan of Care Patient             Patient will benefit from skilled therapeutic intervention in order to improve the following deficits and impairments:  Hypomobility, Increased muscle spasms, Decreased range of motion, Impaired perceived functional ability, Decreased activity tolerance, Decreased strength, Increased fascial restricitons, Pain  Visit Diagnosis: Stiffness of cervical spine  Other muscle spasm  Muscle weakness (generalized)     Problem List Patient Active Problem List   Diagnosis Date Noted   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   PVD (posterior vitreous detachment), left eye 12/03/2020   Vitreomacular traction syndrome of right eye 05/22/2020   Macular retinoschisis of right eye 05/22/2020   Nuclear sclerotic cataract of right eye 04/17/2020   Nuclear sclerotic cataract of left eye 04/17/2020   Hypertensive heart disease without heart failure 10/20/2017   Hyperlipidemia 07/31/2015   Controlled type 2 diabetes mellitus without complication, with long-term current use of insulin ([place]) 06/25/2015   Atrial fibrillation ([place]) 09/11/2013   Long term current use of anticoagulant therapy 09/11/2013   Obesity 09/11/2013   Essential hypertension, benign 09/11/2013    Anderson Malta B. Brieanna Nau, PT 12/01/225:15 PM   [place] @ [place], [place], [address] Phone: [phone]   Fax:  [phone]  Name: Carrie Newton MRN: [id number] Date of Birth: 11/19/1945

## 2021-06-10 ENCOUNTER — Other Ambulatory Visit: Payer: Self-pay

## 2021-06-10 ENCOUNTER — Ambulatory Visit: Payer: Medicare Other

## 2021-06-10 DIAGNOSIS — M436 Torticollis: Secondary | ICD-10-CM | POA: Diagnosis not present

## 2021-06-10 DIAGNOSIS — M6281 Muscle weakness (generalized): Secondary | ICD-10-CM

## 2021-06-10 DIAGNOSIS — M62838 Other muscle spasm: Secondary | ICD-10-CM

## 2021-06-10 NOTE — Therapy (Signed)
[facility] @ [place], [place], [address] Phone: [phone]   Fax:  [phone]  Physical Therapy Discharge Summary  Patient Details  Name: Carrie Newton MRN: [id number] Date of Birth: 1945-09-07 Referring Provider (PT): Delrae Rend, MD   Encounter Date: 06/10/2021   PT End of Session - 06/10/21 1605     Visit Number 9    Number of Visits 12    Date for PT Re-Evaluation 06/10/21    Authorization Type [employer] [place]    Progress Note Due on Visit 10    PT Start Time 1400    PT Stop Time 1450    PT Time Calculation (min) 50 min    Activity Tolerance Patient tolerated treatment well    Behavior During Therapy [facility] for tasks assessed/performed             Past Medical History:  Diagnosis Date   Complication of anesthesia    post op nausea and vomiting   Diabetes ([place])    Type 2   History of echocardiogram 10/2012   Echo 4/14:  EF 60-65%, mild LAE, MAC, impaired relaxation   Hypertension    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 ENDOSCOPY;  Service: Endoscopy;  Laterality: N/A;   ESOPHAGOGASTRODUODENOSCOPY (EGD) WITH PROPOFOL N/A 04/05/2019   Procedure: ESOPHAGOGASTRODUODENOSCOPY (EGD) WITH PROPOFOL;  Surgeon: Wonda Horner, MD;  Location: [facility] ENDOSCOPY;  Service: Endoscopy;  Laterality: N/A;   TOTAL KNEE ARTHROPLASTY  07/2009   Left knee   TRIGGER FINGER RELEASE  2015   Left thumb    There were no vitals filed for this visit.   Subjective Assessment - 06/10/21 1406      Subjective Patient states her hands are feeling better.  The right hand has not been locking up nearly as much.  However, Her left thumb is still fairly painful.  She states she is 75 to 80% better.  She will continue her HEP while she awaits her eye surgeries and knee visco supplementation injections.    Limitations Lifting    How long can you sit comfortably? unlimited    How long can you stand comfortably? 30 min    How long can you walk comfortably? 30 min    Patient Stated Goals To gain upper body strength and be able to do things with her hands with less strain.    Currently in Pain? No/denies    Pain Onset More than a month ago                [facility] PT Assessment - 06/10/21 0001       Assessment   Medical Diagnosis Upper body weakness    Referring Provider (PT) Delrae Rend, MD    Onset Date/Surgical Date 04/29/20    Hand Dominance Right      Home Environment   Living Environment Private residence    Living Arrangements Alone      Prior Function   Level of  Independence Independent    Vocation Retired    Radio producer with friends      Engineer, production Intact      Functional Tests   Functional tests Sit to Stand      Sit to Stand   Comments independent without UE use      Posture/Postural Control   Posture Comments Rounded shoulders fwd head   patient more aware and decreased verbal cues required for correct posture     AROM   Overall AROM  Within functional limits for tasks performed   with exception of bilateral shoulder IR which is limited to waistline     Strength   Overall Strength Deficits    Overall Strength Comments Generally 4/5 throughout bilateral UE's    Strength Assessment Site Knee    Right/Left Knee Right;Left    Right Knee Flexion 4/5    Right Knee Extension 4-/5    Left Knee Flexion 4/5    Left Knee Extension 4-/5                                    PT Education - 06/10/21 1605      Education Details Reviewed HEP and educated patient on what to expect with visco supplementation.    Person(s) Educated Patient    Methods Explanation;Demonstration;Verbal cues;Handout    Comprehension Verbalized understanding;Returned demonstration;Verbal cues required              PT Short Term Goals - 05/22/21 1409       PT SHORT TERM GOAL #1   Title Independence with HEP    Status Achieved    Target Date 05/20/21      PT SHORT TERM GOAL #2   Title Pain no greater than 5/10: patient states that her pain in her hands is at best 5/10 and at worst 9/10               PT Long Term Goals - 06/10/21 1611       PT LONG TERM GOAL #1   Title Patient to be independent with advanced HEP    Time 6    Period Weeks    Status Achieved    Target Date 06/10/21      PT LONG TERM GOAL #2   Title Patient to report pain no greater than 2/10 with routine daily activities to allow increased ease with dressing, bathing and hygiene.    Time 6    Period Weeks    Status Achieved    Target Date 06/10/21      PT LONG TERM GOAL #3   Title Patient to be more active in the community and attend get togethers with friends    Time 6    Period Weeks    Status Achieved    Target Date 06/10/21                   Plan - 06/10/21 1606     Clinical Impression Statement Patient has met 75% of her goals.  She states she is 75-80% improved functionally.  She has eye surgeries coming up and is awaiting approval for visco supplementation for her knees.  She would like to continue her HEP for now and possibly return after injections to work on LE strength.  She should continue to improve if compliant with her HEP but is experiencing end stage bilateral  knee OA.  She may do well post injections in aquatics program.    Personal Factors and Comorbidities Comorbidity 1;Comorbidity 2;Comorbidity 3+    Comorbidities A fib, osteoporosis, OA    Examination-Activity Limitations  Hygiene/Grooming;Lift;Carry;Dressing    Examination-Participation Restrictions Cleaning;Community Activity;Meal Prep    Stability/Clinical Decision Making Evolving/Moderate complexity    Clinical Decision Making Moderate    Rehab Potential Good    PT Frequency 2x / week    PT Duration 6 weeks    PT Treatment/Interventions ADLs/Self Care Home Management;Cryotherapy;Ultrasound;Traction;Moist Heat;Iontophoresis 29m/ml Dexamethasone;Electrical Stimulation;Neuromuscular re-education;Therapeutic exercise;Therapeutic activities;Patient/family education;Manual techniques;Passive range of motion;Dry needling;Splinting;Taping;Spinal Manipulations;Vasopneumatic Device    PT Next Visit Plan Discharging with HEP    PT Home Exercise Plan Access Code: [email or web address]    DCVUDTHYHand Agree with Plan of Care Patient             Patient will benefit from skilled therapeutic intervention in order to improve the following deficits and impairments:  Hypomobility, Increased muscle spasms, Decreased range of motion, Impaired perceived functional ability, Decreased activity tolerance, Decreased strength, Increased fascial restricitons, Pain  Visit Diagnosis: Stiffness of cervical spine  Other muscle spasm  Muscle weakness (generalized)     Problem List Patient Active Problem List   Diagnosis Date Noted   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   PVD (posterior vitreous detachment), left eye 12/03/2020   Vitreomacular traction syndrome of right eye 05/22/2020   Macular retinoschisis of right eye 05/22/2020   Nuclear sclerotic cataract of right eye 04/17/2020   Nuclear sclerotic cataract of left eye 04/17/2020   Hypertensive heart disease without heart failure 10/20/2017   Hyperlipidemia 07/31/2015   Controlled type 2 diabetes mellitus without complication, with long-term current use of insulin ([place] 06/25/2015   Atrial fibrillation ([place] 09/11/2013   Long term current use of anticoagulant therapy  09/11/2013   Obesity 09/11/2013   Essential hypertension, benign 09/11/2013  PHYSICAL THERAPY DISCHARGE SUMMARY  Visits from Start of Care: 9  Current functional level related to goals / functional outcomes: See above   Remaining deficits: See above   Education / Equipment: See above  JAnderson MaltaB. Jihaad Bruschi, PT 12/06/224:18 PM     [email or web address] [email or web address] [address]Phone: [phone]  Fax:  [phone] Name: BKAOIR LOREEMRN: [id number] of Birth: 521-Jul-1947

## 2021-06-10 NOTE — Patient Instructions (Signed)
Reviewed entire HEP.  Instructed patient to break exercises up into sets according to what she is doing during the day.  Notes made on her HEP handout.

## 2021-08-04 ENCOUNTER — Other Ambulatory Visit: Payer: Self-pay | Admitting: Interventional Cardiology

## 2021-08-04 NOTE — Telephone Encounter (Signed)
Prescription refill request for Eliquis received. Indication: Afib  Last office visit:04/18/21 [place])  Scr: 0.79 (05/15/21)  Age: 76 Weight: 124.5kg  Appropriate dose and refill sent to requested pharmacy.

## 2021-10-09 ENCOUNTER — Telehealth: Payer: Self-pay | Admitting: *Deleted

## 2021-10-09 NOTE — Telephone Encounter (Signed)
? ?  Pre-operative Risk Assessment  ?  ?Patient Name: Carrie Newton  ?DOB: Jan 28, 1946 ?MRN: [id number]  ? ?  ? ?Request for Surgical Clearance   ? ?Procedure:   RIGHT MIDDLE FINGER TRIGGER RELEASE  ? ?Date of Surgery:  Clearance TBD                              ?   ?Surgeon:  DR. Roseanne Kaufman ?Surgeon's Group or Practice Name:  [facility] ?Phone number:  [phone] ?Fax number:  [phone] ATTN: Orson Slick ?  ?Type of Clearance Requested:   ?- Medical  ?- Pharmacy:  Hold Apixaban (Eliquis) PER DR. GRAMIG; HOLD 1-2 DAYS PRIOR AND 1 DAY AFTER PROCEDURE ?  ?Type of Anesthesia:  Local  ?  ?Additional requests/questions:   ? ?Signed, ?Julaine Hua   ?10/09/2021, 5:06 PM  ? ?

## 2021-10-10 NOTE — Telephone Encounter (Signed)
Left message for the pt to call back to schedule a tele pre op appt 

## 2021-10-10 NOTE — Telephone Encounter (Signed)
Patient with diagnosis of afib on Eliquis for anticoagulation.   ? ?Procedure: right middle finger trigger release ?Date of procedure: TBD ? ?CHA2DS2-VASc Score = 4  ?This indicates a 4.8% annual risk of stroke. ?The patient's score is based upon: ?CHF History: 0 ?HTN History: 1 ?Diabetes History: 1 ?Stroke History: 0 ?Vascular Disease History: 0 ?Age Score: 1 ?Gender Score: 1 ?  ?Of note, CHADS2VASC score will increase to 5 next month with pt turns 76. ? ?CrCl 30m/min using adjusted body weight due to morbid obesity ?Platelet count 321K ? ?Per office protocol, patient can hold Eliquis for 1-2 days prior to procedure and resume 1 day after as requested. ?

## 2021-10-10 NOTE — Telephone Encounter (Signed)
? ?  Patient Name: Carrie Newton  ?DOB: Oct 15, 1945 ?MRN: [id number] ? ?Primary Cardiologist: Larae Grooms, MD ? ?Chart reviewed as part of pre-operative protocol coverage.  ? ?Preoperative team, please contact this patient and set up a phone call appointment for further cardiac evaluation.   ? ?Thank you for your help. ? ?Patient with diagnosis of afib on Eliquis for anticoagulation.   ?  ?Procedure: right middle finger trigger release ?Date of procedure: TBD ?  ?CHA2DS2-VASc Score = 4  ?This indicates a 4.8% annual risk of stroke. ?The patient's score is based upon: ?CHF History: 0 ?HTN History: 1 ?Diabetes History: 1 ?Stroke History: 0 ?Vascular Disease History: 0 ?Age Score: 1 ?Gender Score: 1 ?  ?Of note, CHADS2VASC score will increase to 5 next month with pt turns 76. ?  ?CrCl 48m/min using adjusted body weight due to morbid obesity ?Platelet count 321K ?  ?Per office protocol, patient can hold Eliquis for 1-2 days prior to procedure and resume 1 day after as requested. ? ?ELenna Sciara NP ?10/10/2021, 3:36 PM ? ? ?

## 2021-10-13 ENCOUNTER — Ambulatory Visit (INDEPENDENT_AMBULATORY_CARE_PROVIDER_SITE_OTHER): Payer: Medicare Other | Admitting: Ophthalmology

## 2021-10-13 ENCOUNTER — Encounter (INDEPENDENT_AMBULATORY_CARE_PROVIDER_SITE_OTHER): Payer: Self-pay | Admitting: Ophthalmology

## 2021-10-13 DIAGNOSIS — H2511 Age-related nuclear cataract, right eye: Secondary | ICD-10-CM | POA: Diagnosis not present

## 2021-10-13 DIAGNOSIS — H33101 Unspecified retinoschisis, right eye: Secondary | ICD-10-CM

## 2021-10-13 DIAGNOSIS — H43821 Vitreomacular adhesion, right eye: Secondary | ICD-10-CM | POA: Diagnosis not present

## 2021-10-13 DIAGNOSIS — H43812 Vitreous degeneration, left eye: Secondary | ICD-10-CM

## 2021-10-13 DIAGNOSIS — H2512 Age-related nuclear cataract, left eye: Secondary | ICD-10-CM

## 2021-10-13 DIAGNOSIS — H33102 Unspecified retinoschisis, left eye: Secondary | ICD-10-CM

## 2021-10-13 NOTE — Assessment & Plan Note (Signed)
Well well centered and secured in the bag. ? ?Patient had some concerns about the "lens settling in place" and she perceived that she might be seeing the lens moving.  I reassured her that the lens is well secured stable and is not moving and that I cannot explain what ever her visual sensations are but that occasional shadowing can occur around the intraocular lens and this should typically stabilize ? ?There are no retinal findings today ?

## 2021-10-13 NOTE — Progress Notes (Signed)
? ? ?10/13/2021 ? ?  ? ?CHIEF COMPLAINT ?Patient presents for  ?Chief Complaint  ?Patient presents with  ? Eye Problem  ? ? ? ? ?HISTORY OF PRESENT ILLNESS: ?Carrie Newton is a 76 y.o. female who presents to the clinic today for:  ? ?HPI   ?WIP- pt feels lens movement Post Cataract [place], sx by Dr. Wyatt Portela. ?Pt states cataract surgery for her right eye was June 20, 2021. Pt states "the new lens has never "seated" in place. It moves when I move my eyes. I saw him August 03, 2021, and he told me my lens is "settling." I told him I don't believe it is going to "settle in place." I told him Dr. Zadie Rhine has to do my retina repair, so I want to see what Dr. Zadie Rhine says. I don't want to live like this, I want it fixed. It is affecting my every day life." ?Last edited by Laurin Coder on 10/13/2021  3:16 PM.  ?  ? ? ?Referring physician: ?Debbra Riding, MD ?[address] ?[address] ?[place],  [place] [id number] ? ?HISTORICAL INFORMATION:  ? ?Selected notes from the [facility] ?  ?   ? ?CURRENT MEDICATIONS: ?No current outpatient medications on file. (Ophthalmic Drugs)  ? ?No current facility-administered medications for this visit. (Ophthalmic Drugs)  ? ?Current Outpatient Medications (Other)  ?Medication Sig  ? ACCU-CHEK AVIVA PLUS test strip 1 each by Other route as needed for other (SUGAR CHECK).   ? alendronate (FOSAMAX) 70 MG tablet Take 70 mg by mouth once a week.  ? atorvastatin (LIPITOR) 20 MG tablet Take 20 mg by mouth daily at 6 PM.   ? buPROPion (WELLBUTRIN XL) 300 MG 24 hr tablet Take 300 mg by mouth daily. Take 1 tab daily  ? ELIQUIS 5 MG TABS tablet TAKE 1 TABLET BY MOUTH  TWICE DAILY  ? felodipine (PLENDIL) 10 MG 24 hr tablet Take 10 mg daily by mouth.  ? furosemide (LASIX) 40 MG tablet Take 1 tablet (40 mg total) by mouth daily as needed for fluid or edema (if you gain 3 pounds over night or 5 pounds in a week).  ? HYDROcodone-acetaminophen (NORCO) 10-325 MG per tablet Take 2 tablets by mouth  daily. Take as directed  ? Insulin Degludec (TRESIBA) 100 UNIT/ML SOLN Inject into the skin. Use depends on blood sugar level.  ? insulin lispro (HUMALOG) 100 UNIT/ML injection Inject into the skin 3 (three) times daily before meals. Use as needed for blood sugar levels before meals.  ? levothyroxine (SYNTHROID) 88 MCG tablet Take 88 mcg by mouth daily before breakfast. Alternate with 75 mcg  ? levothyroxine (SYNTHROID, LEVOTHROID) 75 MCG tablet Take 75 mcg by mouth daily before breakfast. Alternate with 88 mcg tab  ? losartan (COZAAR) 50 MG tablet Take 0.5 tablets by mouth daily.  ? Multiple Vitamin (MULTIVITAMIN) tablet Take 1 tablet by mouth daily.  ? mupirocin ointment (BACTROBAN) 2 % Apply 1 application topically continuous as needed (CUTS).   ? OZEMPIC, 1 MG/DOSE, 2 MG/1.5ML SOPN   ? pantoprazole (PROTONIX) 40 MG tablet Take 40 mg by mouth every other day.   ? sertraline (ZOLOFT) 100 MG tablet Take 100 mg by mouth daily.  ? ?No current facility-administered medications for this visit. (Other)  ? ? ? ? ?REVIEW OF SYSTEMS: ?ROS   ?Negative for: Constitutional, Gastrointestinal, Neurological, Skin, Genitourinary, Musculoskeletal, HENT, Endocrine, Cardiovascular, Eyes, Respiratory, Psychiatric, Allergic/Imm, Heme/Lymph ?Last edited by Hurman Horn,  MD on 10/13/2021  3:40 PM.  ?  ? ? ? ?ALLERGIES ?Allergies  ?Allergen Reactions  ? Metoprolol Tartrate Other (See Comments)  ?  Junctional Bradycardia ?  ? Neosporin [Neomycin-Bacitracin Zn-Polymyx] Rash  ? Norvasc [Amlodipine Besylate] Rash  ? Other Rash  ?  ALLERGEN: Oxycodone Hcl-oxycodone-asa (Other  ? Percodan [Oxycodone-Aspirin] Other (See Comments)  ?  Tingling   ? ? ?PAST MEDICAL HISTORY ?Past Medical History:  ?Diagnosis Date  ? Complication of anesthesia   ? post op nausea and vomiting  ? Diabetes ([place])   ? Type 2  ? History of echocardiogram 10/2012  ? Echo 4/14:  EF 60-65%, mild LAE, MAC, impaired relaxation  ? Hypertension   ? 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 Nuclear sclerotic cataract of right eye 04/17/2020  ? Cataract surgery, IOL implantation, Dr. Wyatt Portela 06-20-2021  ? PAF (paroxysmal atrial fibrillation) ([place])   ? ?Past Surgical History:  ?Procedure Laterality Date  ? BIOPSY  04/05/2019  ? Procedure: BIOPSY;  Surgeon: Wonda Horner, MD;  [facility] END[facility]COPY;  Service: Endoscopy;;  ? BREAST LUMPECTOMY  1992  ? Left breast  ? CARPAL TUNNEL RELEASE    ? Left hand 05/2012 and right hand dec 2013  ? COLONOSCOPY WITH PROPOFOL N/A 04/05/2019  ? Procedure: COLONOSCOPY WITH PROPOFOL;  Surgeon: Wonda Horner, MD;  Location: [facility] ENDOSCOPY;  Service: Endoscopy;  Laterality: N/A;  ? ESOPHAGOGASTRODUODENOSCOPY (EGD) WITH PROPOFOL N/A 04/05/2019  ? Procedure: ESOPHAGOGASTRODUODENOSCOPY (EGD) WITH PROPOFOL;  Surgeon: Wonda Horner, MD;  Location: [facility] ENDOSCOPY;  Service: Endoscopy;  Laterality: N/A;  ? TOTAL KNEE ARTHROPLASTY  07/2009  ? Left knee  ? TRIGGER FINGER RELEASE  2015  ? Left thumb  ? ? ?FAMILY HISTORY ?Family History  ?Problem Relation Age of Onset  ? Hypertension Father   ? Heart attack Father   ? Stroke Neg Hx   ? ? ?SOCIAL HISTORY ?Social History  ? ?Tobacco Use  ? Smoking status: Never  ? Smokeless tobacco: Never  ?Vaping Use  ? Vaping Use: Never used  ?Substance Use Topics  ? Alcohol use: No  ?  Alcohol/week: 0.0 standard drinks  ? Drug use: No  ? ?  ? ?  ? ?OPHTHALMIC EXAM: ? ?Base Eye Exam   ? ? Visual Acuity (ETDRS)   ? ?   Right Left  ? Dist [place] 20/20 -1 20/30 +2  ? ?  ?  ? ? Tonometry (Tonopen, 3:18 PM)   ? ?   Right Left  ? Pressure 11 14  ? ?  ?  ? ? Pupils   ? ?   Pupils Dark Light APD  ? Right PERRL 4 3 None  ? Left PERRL 4 3 None  ? ?  ?  ? ? Visual Fields (Counting fingers)   ? ?   Left Right  ?  Full Full  ? ?  ?  ? ? Extraocular Movement   ? ?   Right Left  ?  Full Full  ? ?  ?  ? ? Neuro/Psych   ? ? Oriented x3: Yes  ? Mood/Affect: Normal  ? ?  ?  ? ? Dilation   ? ? Both eyes: 1.0% Mydriacyl, 2.5% Phenylephrine @ 3:18 PM  ? ?  ?  ? ?  ? ?Slit  Lamp and Fundus Exam   ? ? External Exam   ? ?   Right Left  ? External Normal Normal  ? ?  ?  ? ?  Slit Lamp Exam   ? ?   Right Left  ? Lids/Lashes Normal Normal  ? Conjunctiva/Sclera White and quiet White and quiet  ? Cornea Clear Clear  ? Anterior Chamber Deep and quiet Deep and quiet  ? Iris Round and reactive Round and reactive  ? Lens Centered posterior chamber intraocular lens, no IOL movement, in the bag 3+ Nuclear sclerosis  ? Anterior Vitreous Normal Normal  ? ?  ?  ? ? Fundus Exam   ? ?   Right Left  ? Posterior Vitreous Central vitreous floaters, partial PVD? Normal  ? Disc Normal Normal  ? C/D Ratio 0.35 0.4  ? Macula Normal, neg Watzke, pseudo cystoid appearance to the fovea Normal, neg Watzke  ? Vessels Normal Normal  ? Periphery Normal Normal  ? ?  ?  ? ?  ? ? ?IMAGING AND PROCEDURES  ?Imaging and Procedures for 10/13/21 ? ?OCT, Retina - OU - Both Eyes   ? ?   ?Right Eye ?Quality was good. Scan locations included subfoveal. Central Foveal Thickness: 294. Progression has improved. Findings include vitreous traction, vitreomacular adhesion , abnormal foveal contour.  ? ?Left Eye ?Quality was good. Scan locations included subfoveal. Central Foveal Thickness: 240. Progression has improved. Findings include normal foveal contour.  ? ?Notes ?OD, inner foveal macular schisis right eye with intact outer retinal segments in the fovea suggesting  ? ?OS, no foveal elevation remains, incidental posterior vitreous detachment ? ?  ? ?Color Fundus Photography Optos - OU - Both Eyes   ? ?   ?Right Eye ?Progression has no prior data. Disc findings include normal observations. Macula : normal observations. Vessels : normal observations. Periphery : normal observations.  ? ?Left Eye ?Progression has no prior data. Macula : normal observations. Vessels : normal observations. Periphery : normal observations.  ? ?  ? ? ?  ?  ? ?  ?ASSESSMENT/PLAN: ? ?Vitreomacular traction syndrome of right eye ?As compared to prior to  cataract surgery, with the onset of pseudophakia under direction of Dr. Wyatt Portela, VMT and tractional unit fovea has diminished as a condition continues to progress to what likely is going to be completi

## 2021-10-13 NOTE — Telephone Encounter (Signed)
Left message x 2 to schedule a tele pre op appt I will send FYI to requesting office the pt will need a telephone visit.  ?

## 2021-10-13 NOTE — Assessment & Plan Note (Signed)
As compared to prior to cataract surgery, with the onset of pseudophakia under direction of Dr. Wyatt Portela, VMT and tractional unit fovea has diminished as a condition continues to progress to what likely is going to be completion of PVD while retaining excellent acuity and minor inner foveal macular schisis. ? ?While patient was ready to proceed with surgical intervention, via vitrectomy, in my opinion this is no longer required in the right eye.  VMT spontaneous release has occurred in the left eye has now occurring in the right eye with no impact on acuity ?

## 2021-10-13 NOTE — Assessment & Plan Note (Signed)
OS, with 20/30 vision, I recommend patient consider cataract extraction with intraocular lens placement, follow-up with Dr. Wyatt Portela ?

## 2021-10-13 NOTE — Assessment & Plan Note (Signed)
Stable OS 

## 2021-10-13 NOTE — Assessment & Plan Note (Signed)
Much less foveal macular schisis right eye is VMT VMA continues to progress to what appears to be an impending PVD ?

## 2021-10-13 NOTE — Assessment & Plan Note (Signed)
Condition resolved with release of VMA VMT spontaneously ?

## 2021-10-14 ENCOUNTER — Telehealth: Payer: Self-pay | Admitting: *Deleted

## 2021-10-14 NOTE — Telephone Encounter (Signed)
Pt called back and is agreeable to plan of care for tele pre op appt 10/16/21 @ 10 am, per pt request. Med rec and consent are done.  ? ?  ?Patient Consent for Virtual Visit  ? ? ?   ? ?Carrie Newton has provided verbal consent on 10/14/2021 for a virtual visit (video or telephone). ? ? ?CONSENT FOR VIRTUAL VISIT FOR:  Carrie Newton  ?By participating in this virtual visit I agree to the following: ? ?I hereby voluntarily request, consent and authorize [facility] and its employed or contracted physicians, physician assistants, nurse practitioners or other licensed health care professionals (the Practitioner), to provide me with telemedicine health care services (the ?Services") as deemed necessary by the treating Practitioner. I acknowledge and consent to receive the Services by the Practitioner via telemedicine. I understand that the telemedicine visit will involve communicating with the Practitioner through live audiovisual communication technology and the disclosure of certain medical information by electronic transmission. I acknowledge that I have been given the opportunity to request an in-person assessment or other available alternative prior to the telemedicine visit and am voluntarily participating in the telemedicine visit. ? ?I understand that I have the right to withhold or withdraw my consent to the use of telemedicine in the course of my care at any time, without affecting my right to future care or treatment, and that the Practitioner or I may terminate the telemedicine visit at any time. I understand that I have the right to inspect all information obtained and/or recorded in the course of the telemedicine visit and may receive copies of available information for a reasonable fee.  I understand that some of the potential risks of receiving the Services via telemedicine include:  ?Delay or interruption in medical evaluation due to technological equipment failure or disruption; ?Information  transmitted may not be sufficient (e.g. poor resolution of images) to allow for appropriate medical decision making by the Practitioner; and/or  ?In rare instances, security protocols could fail, causing a breach of personal health information. ? ?Furthermore, I acknowledge that it is my responsibility to provide information about my medical history, conditions and care that is complete and accurate to the best of my ability. I acknowledge that Practitioner's advice, recommendations, and/or decision may be based on factors not within their control, such as incomplete or inaccurate data provided by me or distortions of diagnostic images or specimens that may result from electronic transmissions. I understand that the practice of medicine is not an exact science and that Practitioner makes no warranties or guarantees regarding treatment outcomes. I acknowledge that a copy of this consent can be made available to me via my patient portal ([facility]), or I can request a printed copy by calling the office of [facility].   ? ?I understand that my insurance will be billed for this visit.  ? ?I have read or had this consent read to me. ?I understand the contents of this consent, which adequately explains the benefits and risks of the Services being provided via telemedicine.  ?I have been provided ample opportunity to ask questions regarding this consent and the Services and have had my questions answered to my satisfaction. ?I give my informed consent for the services to be provided through the use of telemedicine in my medical care ? ? ? ?

## 2021-10-14 NOTE — Telephone Encounter (Signed)
Call returned and transferred to [facility], [place]. ?

## 2021-10-14 NOTE — Telephone Encounter (Signed)
Pt called back and is agreeable to plan of care for tele pre op appt 10/16/21 @ 10 am, per pt request. Med rec and consent are done.  ?

## 2021-10-16 ENCOUNTER — Encounter: Payer: Self-pay | Admitting: Nurse Practitioner

## 2021-10-16 ENCOUNTER — Ambulatory Visit (INDEPENDENT_AMBULATORY_CARE_PROVIDER_SITE_OTHER): Payer: Medicare Other | Admitting: Nurse Practitioner

## 2021-10-16 DIAGNOSIS — Z01810 Encounter for preprocedural cardiovascular examination: Secondary | ICD-10-CM | POA: Diagnosis not present

## 2021-10-16 NOTE — Progress Notes (Signed)
? ?Virtual Visit via Telephone Note  ? ?This visit type was conducted due to national recommendations for restrictions regarding the COVID-19 Pandemic (e.g. social distancing) in an effort to limit this patient's exposure and mitigate transmission in our community.  Due to her co-morbid illnesses, this patient is at least at moderate risk for complications without adequate follow up.  This format is felt to be most appropriate for this patient at this time.  The patient did not have access to video technology/had technical difficulties with video requiring transitioning to audio format only (telephone).  All issues noted in this document were discussed and addressed.  No physical exam could be performed with this format.  Please refer to the patient's chart for her  consent to telehealth for [facility]. ? ?Evaluation Performed:  Preoperative cardiovascular risk assessment ?_____________  ? ?Date:  10/16/2021  ? ?Patient ID:  Carrie, Newton [birth year], MRN [id number] ?Patient Location:  ?Home ?Provider location:   ?Office ? ?Primary Care Provider:  Mayra Neer, MD ?Primary Cardiologist:  Larae Grooms, MD ? ?Chief Complaint  ?  ?76 y.o. y/o female with a h/o PAF on chronic anticoagulation, diabetes, chronic HFpEF, who is pending right middle finger trigger release, and presents today for telephonic preoperative cardiovascular risk assessment.  ? ?Past Medical History  ?  ?Past Medical History:  ?Diagnosis Date  ? Complication of anesthesia   ? post op nausea and vomiting  ? Diabetes ([place])   ? Type 2  ? History of echocardiogram 10/2012  ? Echo 4/14:  EF 60-65%, mild LAE, MAC, impaired relaxation  ? Hypertension   ? 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 Nuclear sclerotic cataract of right eye 04/17/2020  ? Cataract surgery, IOL implantation, Dr. Wyatt Portela 06-20-2021  ? PAF (paroxysmal atrial fibrillation) ([place])   ? ?Past Surgical History:  ?Procedure Laterality Date  ? BIOPSY  04/05/2019  ? Procedure:  BIOPSY;  Surgeon: Wonda Horner, MD;  Location: [facility] ENDOSCOPY;  Service: Endoscopy;;  ? BREAST LUMPECTOMY  1992  ? Left breast  ? CARPAL TUNNEL RELEASE    ? Left hand 05/2012 and right hand dec 2013  ? COLONOSCOPY WITH PROPOFOL N/A 04/05/2019  ? Procedure: COLONOSCOPY WITH PROPOFOL;  Surgeon: Wonda Horner, MD;  Location: [facility] ENDOSCOPY;  Service: Endoscopy;  Laterality: N/A;  ? ESOPHAGOGASTRODUODENOSCOPY (EGD) WITH PROPOFOL N/A 04/05/2019  ? Procedure: ESOPHAGOGASTRODUODENOSCOPY (EGD) WITH PROPOFOL;  Surgeon: Wonda Horner, MD;  Location: [facility] ENDOSCOPY;  Service: Endoscopy;  Laterality: N/A;  ? TOTAL KNEE ARTHROPLASTY  07/2009  ? Left knee  ? TRIGGER FINGER RELEASE  2015  ? Left thumb  ? ? ?Allergies ? ?Allergies  ?Allergen Reactions  ? Metoprolol Tartrate Other (See Comments)  ?  Junctional Bradycardia ?  ? Neosporin [Neomycin-Bacitracin Zn-Polymyx] Rash  ? Norvasc [Amlodipine Besylate] Rash  ? Other Rash  ?  ALLERGEN: Oxycodone Hcl-oxycodone-asa (Other  ? Percodan [Oxycodone-Aspirin] Other (See Comments)  ?  Tingling   ? ? ?History of Present Illness  ?  ?Carrie Newton is a 76 y.o. female who presents via audio/video conferencing for a telehealth visit today.  Pt was last seen in cardiology clinic on 04/18/21 by Dr. Irish Lack.  At that time Carrie Newton was doing well. She called on 05/05/21 to report leg edema which she states has improved. The patient is now pending right middle finger trigger release.  Since her last visit, she reports she has been doing well without specific cardiac concerns. She denies chest  discomfort, dyspnea, orthopnea, PND, palpitations, presyncope, or syncope. Has some hematuria due to chronic kidney stone that she cannot pass, sees urology for this. No worsening bleeding recently.  ? ? ?Home Medications  ?  ?Prior to Admission medications   ?Medication Sig Start Date End Date Taking? Authorizing Provider  ?ACCU-CHEK AVIVA PLUS test strip 1 each by Other route as needed for other (SUGAR  CHECK).  08/07/13   [provider]  ?alendronate (FOSAMAX) 70 MG tablet Take 70 mg by mouth once a week. 02/28/21   [provider]  ?atorvastatin (LIPITOR) 20 MG tablet Take 20 mg by mouth daily at 6 PM.  08/31/13   [provider]  ?buPROPion (WELLBUTRIN XL) 300 MG 24 hr tablet Take 300 mg by mouth daily. Take 1 tab daily 08/31/13   [provider]  ?Calcium-Phosphorus-Vitamin D (CITRACAL +D3 PO) Take 1 tablet by mouth daily.    [provider]  ?diclofenac Sodium (VOLTAREN) 1 % GEL Apply topically daily as needed. 06/18/21   [provider]  ?ELIQUIS 5 MG TABS tablet TAKE 1 TABLET BY MOUTH  TWICE DAILY 08/04/21   Jettie Booze, MD  ?felodipine (PLENDIL) 10 MG 24 hr tablet Take 10 mg daily by mouth.    [provider]  ?furosemide (LASIX) 40 MG tablet Take 1 tablet (40 mg total) by mouth daily as needed for fluid or edema (if you gain 3 pounds over night or 5 pounds in a week). 05/06/21   Jettie Booze, MD  ?HYDROcodone-acetaminophen [facility]) 10-325 MG per tablet Take 2 tablets by mouth daily. Take as directed 08/28/13   [provider]  ?Insulin Degludec (TRESIBA) 100 UNIT/ML SOLN Inject into the skin. Use depends on blood sugar level.    [provider]  ?insulin lispro (HUMALOG) 100 UNIT/ML injection Inject into the skin 3 (three) times daily before meals. Use as needed for blood sugar levels before meals.    [provider]  ?levothyroxine (SYNTHROID) 88 MCG tablet Take 88 mcg by mouth daily before breakfast. Alternate with 75 mcg 02/06/19   [provider]  ?levothyroxine (SYNTHROID, LEVOTHROID) 75 MCG tablet Take 75 mcg by mouth daily before breakfast. Alternate with 88 mcg tab 08/31/13   [provider]  ?losartan (COZAAR) 50 MG tablet Take 0.5 tablets by mouth daily. 03/04/20   [provider]  ?Multiple Vitamin (MULTIVITAMIN) tablet Take 1 tablet by mouth daily.    [provider]  ?mupirocin ointment (BACTROBAN) 2 % Apply 1 application topically continuous as needed (CUTS).  07/05/15   [provider]  ?OZEMPIC, 1 MG/DOSE, 2 MG/1.5ML SOPN  11/28/18   [provider]  ?pantoprazole (PROTONIX) 40 MG tablet Take 40 mg by mouth every other day.  08/31/13   [provider]  ?sertraline (ZOLOFT) 100 MG tablet Take 100 mg by mouth daily.    [provider]  ?vitamin C (ASCORBIC ACID) 500 MG tablet Take 1 tablet by mouth daily as needed.    [provider]  ? ? ?Physical Exam  ?  ?Vital Signs:  Carrie Newton does not have vital signs available for review today. ? ?Given telephonic nature of communication, physical exam is limited. ?AAOx3. NAD. Normal affect.  Speech and respirations are unlabored. ? ?Accessory Clinical Findings  ?  ?None ? ?Assessment & Plan  ?  ?1.  Preoperative Cardiovascular Risk Assessment: She is doing well from a cardiac perspective and may proceed to surgery without further cardiac  testing. According to the Revised Cardiac Risk Index (RCRI), her Perioperative Risk of Major Cardiac Event is (%): 0.9. Her Functional Capacity in METs is: 5.07 according to the Duke Activity Status Index (DASI). ? ?CHA2DS2-VASc Score = 4  ?This indicates a 4.8% annual risk of stroke. ?The patient's score is based upon: ?CHF History: 0 ?HTN History: 1 ?Diabetes History: 1 ?Stroke History: 0 ?Vascular Disease History: 0 ?Age Score: 1 ?Gender Score: 1 ?  ?Of note, CHADS2VASC score will increase to 5 next month with pt turns 76. ?  ?CrCl 25m/min using adjusted body weight due to morbid obesity ?Platelet count 321K ?  ?Per office protocol, patient can hold Eliquis for 1-2 days prior to procedure and resume 1 day after as requested. ? ?A copy of this note will be routed to requesting surgeon. ? ?Time:   ?Today, I have spent 10 minutes with the patient with telehealth technology discussing medical history, symptoms, and management plan.   ? ? ?MEmmaline Life NP-C ? ?  ?10/16/2021, 10:02 AM ?[place]?[address] ?Office [phone] [phone]? ?

## 2021-10-28 ENCOUNTER — Telehealth: Payer: Self-pay | Admitting: Interventional Cardiology

## 2021-10-28 NOTE — Telephone Encounter (Signed)
Spoke with the patient who states that when she woke up this morning she felt a little bit off. She states that she was not having any pain, lightheadedness or dizziness she just felt a bit more fatigued that usual. She took her BP and HR with her wrist cuff and it indicated that her HR was irregular - 10:30am 93/65 HR 89; This afternoon 92/67 HR 99.  ?She states that she has been resting most of the day. She denies any shortness of breath. Denies any palpitations.  ?She states that when she feels her pulse manually she can tell that it is irregular. She is taking her Eliquis as prescribed. She will continue to monitor and advised we will let Dr. Irish Lack know and call back with recommendations. Patient verbalized understanding.  ?

## 2021-10-28 NOTE — Telephone Encounter (Signed)
Would have her come in for ECG and visit.  Maybe she can come see me tomorrow or the day after. May need to consider amiodarone to maintain NSR. ?

## 2021-10-28 NOTE — Telephone Encounter (Signed)
Patient c/o Palpitations:  High priority if patient c/o lightheadedness, shortness of breath, or chest pain ? ?How long have you had palpitations/irregular HR/ Afib? Are you having the symptoms now? Yes  ? ?Are you currently experiencing lightheadedness, SOB or CP? no ? ?Do you have a history of afib (atrial fibrillation) or irregular heart rhythm? One episode  ? ?Have you checked your BP or HR? (document readings if available): 10:30am 93/65 HR 89; This afternoon 92/67 HR 99 ? ?Are you experiencing any other symptoms? Irregular HR  ? ?

## 2021-10-29 ENCOUNTER — Ambulatory Visit (INDEPENDENT_AMBULATORY_CARE_PROVIDER_SITE_OTHER): Payer: Medicare Other | Admitting: Interventional Cardiology

## 2021-10-29 VITALS — BP 112/66 | HR 68 | Ht 62.0 in | Wt 269.0 lb

## 2021-10-29 DIAGNOSIS — I119 Hypertensive heart disease without heart failure: Secondary | ICD-10-CM

## 2021-10-29 DIAGNOSIS — I480 Paroxysmal atrial fibrillation: Secondary | ICD-10-CM | POA: Diagnosis not present

## 2021-10-29 DIAGNOSIS — Z01810 Encounter for preprocedural cardiovascular examination: Secondary | ICD-10-CM

## 2021-10-29 DIAGNOSIS — Z794 Long term (current) use of insulin: Secondary | ICD-10-CM

## 2021-10-29 DIAGNOSIS — E782 Mixed hyperlipidemia: Secondary | ICD-10-CM

## 2021-10-29 DIAGNOSIS — E119 Type 2 diabetes mellitus without complications: Secondary | ICD-10-CM | POA: Diagnosis not present

## 2021-10-29 DIAGNOSIS — Z7901 Long term (current) use of anticoagulants: Secondary | ICD-10-CM | POA: Diagnosis not present

## 2021-10-29 MED ORDER — AMIODARONE HCL 400 MG PO TABS: ORAL_TABLET | ORAL | 1 refills | Status: DC

## 2021-10-29 NOTE — Telephone Encounter (Signed)
Reviewed with Dr Irish Lack and he can see patient today.  I spoke with patient and scheduled appointment for 11:00 today.  Patient reports recent Hemoglobin was 9.4 and Hematocrit was 29.  TSH was normal.  ?

## 2021-10-29 NOTE — Telephone Encounter (Signed)
I spoke with patient and scheduled her to see Dr Irish Lack on 10/30/21 at 2:30.  Patient reports last evening her BP was 118/72 and heart rate was 121.  She checked while on the phone with me and currently BP is 135/67 and heart rate is 131.  Machine does not indicate heart rate is currently irregular.  Patient reports she has hot flashes at times but otherwise no symptoms.  She is aware of elevated heart rate.  Patient reports she was recently diagnosed as being anemic. ?Will review with Dr Irish Lack ?

## 2021-10-29 NOTE — Patient Instructions (Addendum)
Medication Instructions:  ?Your physician has recommended you make the following change in your medication: Start Amiodarone.  Dose is 400 mg twice daily for one week and then 400 mg once daily.  ? ?*If you need a refill on your cardiac medications before your next appointment, please call your pharmacy* ? ? ?Lab Work: ?Your physician recommends that you return for lab work on Nov 19, 2021.  The lab is open from 7:15-4:30.  This is not fasting. CBC and BMP ? ?If you have labs (blood work) drawn today and your tests are completely normal, you will receive your results only by: ?MyChart Message (if you have MyChart) OR ?A paper copy in the mail ?If you have any lab test that is abnormal or we need to change your treatment, we will call you to review the results. ? ? ?Testing/Procedures: ?Your physician has recommended that you have a Cardioversion (DCCV). Electrical Cardioversion uses a jolt of electricity to your heart either through paddles or wired patches attached to your chest. This is a controlled, usually prescheduled, procedure. Defibrillation is done under light anesthesia in the hospital, and you usually go home the day of the procedure. This is done to get your heart back into a normal rhythm. You are not awake for the procedure. Please see the instruction sheet given to you today.  ?Scheduled for Nov 21, 2021 ? ? ?Follow-Up: ?At [facility], you and your health needs are our priority.  As part of our continuing mission to provide you with exceptional heart care, we have created designated Provider Care Teams.  These Care Teams include your primary Cardiologist (physician) and Advanced Practice Providers (APPs -  Physician Assistants and Nurse Practitioners) who all work together to provide you with the care you need, when you need it. ? ?We recommend signing up for the patient portal called "MyChart".  Sign up information is provided on this After Visit Summary.  MyChart is used to connect with patients  for Virtual Visits (Telemedicine).  Patients are able to view lab/test results, encounter notes, upcoming appointments, etc.  Non-urgent messages can be sent to your provider as well.   ?To learn more about what you can do with MyChart, go to [email or web address].   ? ?Your next appointment:   ?6 week(s) ? ?The format for your next appointment:   ?In Person ? ?Provider:   ?You will follow up in the [facility] located at [facility]. ?Your provider will be: ?Roderic Palau, NP or Clint R. Fenton, PA-C  ? ? ?Other Instructions ?   ?You are scheduled for a Cardioversion on Nov 21, 2021 with Dr. Johnsie Cancel.  Please arrive at the [facility] (Main Entrance A) at [facility]: [address] [place], [place] [address] at 9:30 am   ? ?DIET: Nothing to eat or drink after midnight except a sip of water with medications (see medication instructions below) ? ?FYI: For your safety, and to allow [place] to monitor your vital signs accurately during the surgery/procedure we request that   ?if you have artificial nails, gel coating, SNS etc. Please have those removed prior to your surgery/procedure. Not having the nail coverings /polish removed may result in cancellation or delay of your surgery/procedure. ? ? ?Medication Instructions: Take half your normal insulin dose the evening before the procedure.  Do not take any diabetes medication the morning of the procedure.  ?  ? ?Continue your anticoagulant: Eliquis ?You will need to continue your anticoagulant after your  procedure until you  are told by your  ?Provider that it is safe to stop ? ? ?Labs--To be done on Nov 19, 2021.  CBC and BMP.  This is not fasting.  The lab is open from 7:15-4:30 ? ? ? ?You must have a responsible person to drive you home and stay in the waiting area during your procedure. Failure to do so could result in cancellation. ? ?Interior and spatial designer cards. ? ?*Special Note: Every effort is made to have your procedure done on time.  Occasionally there are emergencies that occur at the hospital that may cause delays. Please be patient if a delay does occur.   ? ? ? ?AFIB CLINIC INFORMATION: ? ? Please arrive 15 minutes early for check-in. ?The AFib Clinic is located in the Heart and Vascular Specialty Clinics at [facility]. ?Parking instructions/directions: [address] (off [address] [employer]). When you pull in to Entrance C, there is an underground parking garage to your right. The code to enter the garage is 1004 Take the elevators to the first floor. Follow the signs to the Heart and Vascular Specialty Clinics. You will see registration at the end of the hallway.  ?Phone number: [phone] ? ? ? ? ? ?Important Information About Sugar ? ? ? ? ? ? ?

## 2021-10-29 NOTE — Addendum Note (Signed)
Addended by: Thompson Grayer on: 10/29/2021 12:05 PM ? ? Modules accepted: Orders ? ?

## 2021-10-29 NOTE — Progress Notes (Signed)
?  ?Cardiology Office Note ? ? ?Date:  10/29/2021  ? ?ID:  Carrie Newton, DOB [birth year], MRN [id number] ? ?PCP:  Mayra Neer, MD  ? ? ?No chief complaint on file. ? ?AFib ? ?Wt Readings from Last 3 Encounters:  ?10/29/21 269 lb (122 kg)  ?04/18/21 274 lb 6.4 oz (124.5 kg)  ?04/17/20 280 lb 3.2 oz (127.1 kg)  ?  ? ?  ?History of Present Illness: ?Carrie Newton is a 76 y.o. female  Who has had PAF diagnosed several years ago.  ?EF 60-65% in 2014.   LA dimension of 4.7 cm.   ?  ?She was treated with Xarelto for stroke prevention.  In November and December 2016, she experienced episodic nose bleeding. She was seen in the office. She was sent to ENT. There was a scab found on the bleeding area in her nose. ?  ?She has had some issues with hypoglycemia. ?  ?EGD/Colonoscopy in 2020 with Dr. Penelope Coop- no ulcers, no polyps. ?  ?In 2021, she had a kidney stone.  She had some hematuria in 3/21.  Has a kidney stone that hsa grown per her report in 2022. Has occasional flank pain.  ? ?Of late, she has noticed more dyspnea on exertion and her blood pressure machine at home and oxygen saturation of monitor show higher heart rates than usual, up in the 90s. ?Denies : Chest pain. Dizziness. Leg edema. Nitroglycerin use. Orthopnea.  Paroxysmal nocturnal dyspnea. Syncope.   ? ? ?Past Medical History:  ?Diagnosis Date  ? Complication of anesthesia   ? post op nausea and vomiting  ? Diabetes ([place])   ? Type 2  ? History of echocardiogram 10/2012  ? Echo 4/14:  EF 60-65%, mild LAE, MAC, impaired relaxation  ? Hypertension   ? 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 Nuclear sclerotic cataract of right eye 04/17/2020  ? Cataract surgery, IOL implantation, Dr. Wyatt Portela 06-20-2021  ? PAF (paroxysmal atrial fibrillation) ([place])   ? ? ?Past Surgical History:  ?Procedure Laterality Date  ? BIOPSY  04/05/2019  ? Procedure: BIOPSY;  Surgeon: Wonda Horner, MD;  Location: [facility] ENDOSCOPY;  Service: Endoscopy;;  ? BREAST LUMPECTOMY  1992  ? Left breast  ?  CARPAL TUNNEL RELEASE    ? Left hand 05/2012 and right hand dec 2013  ? COLONOSCOPY WITH PROPOFOL N/A 04/05/2019  ? Procedure: COLONOSCOPY WITH PROPOFOL;  Surgeon: Wonda Horner, MD;  Location: [facility] ENDOSCOPY;  Service: Endoscopy;  Laterality: N/A;  ? ESOPHAGOGASTRODUODENOSCOPY (EGD) WITH PROPOFOL N/A 04/05/2019  ? Procedure: ESOPHAGOGASTRODUODENOSCOPY (EGD) WITH PROPOFOL;  Surgeon: Wonda Horner, MD;  Location: [facility] ENDOSCOPY;  Service: Endoscopy;  Laterality: N/A;  ? TOTAL KNEE ARTHROPLASTY  07/2009  ? Left knee  ? TRIGGER FINGER RELEASE  2015  ? Left thumb  ? ? ? ?Current Outpatient Medications  ?Medication Sig Dispense Refill  ? ACCU-CHEK AVIVA PLUS test strip 1 each by Other route as needed for other (SUGAR CHECK).     ? alendronate (FOSAMAX) 70 MG tablet Take 70 mg by mouth once a week.    ? atorvastatin (LIPITOR) 20 MG tablet Take 20 mg by mouth daily at 6 PM.     ? buPROPion (WELLBUTRIN XL) 300 MG 24 hr tablet Take 300 mg by mouth daily. Take 1 tab daily    ? Calcium-Phosphorus-Vitamin D (CITRACAL +D3 PO) Take 1 tablet by mouth daily.    ? diclofenac Sodium (VOLTAREN) 1 % GEL Apply topically daily as needed.    ?  ELIQUIS 5 MG TABS tablet TAKE 1 TABLET BY MOUTH  TWICE DAILY 180 tablet 3  ? felodipine (PLENDIL) 10 MG 24 hr tablet Take 10 mg daily by mouth.    ? furosemide (LASIX) 40 MG tablet Take 1 tablet (40 mg total) by mouth daily as needed for fluid or edema (if you gain 3 pounds over night or 5 pounds in a week). 45 tablet 3  ? HYDROcodone-acetaminophen (NORCO) 10-325 MG per tablet Take 2 tablets by mouth daily. Take as directed    ? Insulin Degludec (TRESIBA) 100 UNIT/ML SOLN Inject into the skin. Use depends on blood sugar level.    ? insulin lispro (HUMALOG) 100 UNIT/ML injection Inject into the skin 3 (three) times daily before meals. Use as needed for blood sugar levels before meals.    ? levothyroxine (SYNTHROID) 88 MCG tablet Take 88 mcg by mouth daily before breakfast. Alternate with 75 mcg    ?  levothyroxine (SYNTHROID, LEVOTHROID) 75 MCG tablet Take 75 mcg by mouth daily before breakfast. Alternate with 88 mcg tab    ? losartan (COZAAR) 50 MG tablet Take 0.5 tablets by mouth daily.    ? Multiple Vitamin (MULTIVITAMIN) tablet Take 1 tablet by mouth daily.    ? mupirocin ointment (BACTROBAN) 2 % Apply 1 application topically continuous as needed (CUTS).     ? OZEMPIC, 1 MG/DOSE, 2 MG/1.5ML SOPN Inject 1 mg into the skin once a week.    ? pantoprazole (PROTONIX) 40 MG tablet Take 40 mg by mouth every other day.     ? sertraline (ZOLOFT) 100 MG tablet Take 100 mg by mouth daily.    ? vitamin C (ASCORBIC ACID) 500 MG tablet Take 1 tablet by mouth daily as needed.    ? ?No current facility-administered medications for this visit.  ? ? ?Allergies:   Metoprolol tartrate, Neosporin [neomycin-bacitracin zn-polymyx], Norvasc [amlodipine besylate], Other, and Percodan [oxycodone-aspirin]  ? ? ?Social History:  The patient  reports that she has never smoked. She has never used smokeless tobacco. She reports that she does not drink alcohol and does not use drugs.  ? ?Family History:  The patient's family history includes Heart attack in her father; Hypertension in her father.  ? ? ?ROS:  Please see the history of present illness.   Otherwise, review of systems are positive for DOE.   All other systems are reviewed and negative.  ? ? ?PHYSICAL EXAM: ?VS:  BP 112/66   Pulse 68   Ht _0  (1.575 m)   Wt 269 lb (122 kg)   SpO2 99%   BMI 49.20 kg/m?  , BMI Body mass index is 49.2 kg/m?. ?GEN: Well nourished, well developed, in no acute distress ?HEENT: normal ?Neck: no JVD, carotid bruits, or masses ?Cardiac: irregularly irregular, tachycardia; no murmurs, rubs, or gallops,no edema  ?Respiratory:  clear to auscultation bilaterally, normal work of breathing ?GI: soft, nontender, nondistended, + BS ?MS: no deformity or atrophy ?Skin: warm and dry, no rash ?Neuro:  Strength and sensation are intact ?Psych: euthymic mood,  full affect ? ? ?EKG:   ?The ekg ordered today demonstrates AFib, RVR, ST depressions ? ? ?Recent Labs: ?05/15/2021: BUN 18; Creatinine, Ser 0.79; NT-Pro BNP 276; Potassium 4.8; Sodium 140  ? ?Lipid Panel ?No results found for: CHOL, TRIG, HDL, CHOLHDL, VLDL, LDLCALC, LDLDIRECT ?  ?Other studies Reviewed: ?Additional studies/ records that were reviewed today with results demonstrating: labs reviewed; TSH normal in 09/2021.  Hbg 9.4 in 09/2021. ? ? ?  ASSESSMENT AND PLAN: ? ?AFib: Sx are only with exertion.  Appears euvolemic.  start Amiodarone 400 mg BID x 7.  Decrease to 400 mg daily after 1 week.  Longterm hope to have Amio at 200 mg daily. Will need Synthroid adjusted after TSH checked in 6 weeks.  We discussed that if symptoms get worse or she has bothersome palpitations while at rest or resting shortness of breath, she will need to go to the emergency room. ?Acquired thrombophilia: Eliquis for stroke prevention.  Mild anemia noted.  Taking iron supplement. ?Hyperlipidemia: Continue atorvastatin. ?Hypertensive heart disease: Blood pressure well controlled. ?Postpone finger surgery as Eliquis would need to be held.  This would take away the option for cardioversion in a few weeks.  We will set up cardioversion for 3 to 4 weeks in the event the amiodarone does not convert her. ? ? ?Current medicines are reviewed at length with the patient today.  The patient concerns regarding her medicines were addressed. ? ?The following changes have been made:  start Amio ? ?Labs/ tests ordered today include: TSH, CBC in 6 weeks ?No orders of the defined types were placed in this encounter. ? ? ?Recommend 150 minutes/week of aerobic exercise ?Low fat, low carb, high fiber diet recommended ? ?Disposition:   FU in 6 weeks in AFib clinic ? ? ?Signed, ?Larae Grooms, MD  ?10/29/2021 11:15 AM    ?[facility] ?[address], [place], [place]  [address] ?Phone: [phone]; Fax: [phone]  ? ?

## 2021-10-30 ENCOUNTER — Ambulatory Visit: Payer: Medicare Other | Admitting: Interventional Cardiology

## 2021-10-31 ENCOUNTER — Telehealth: Payer: Self-pay | Admitting: Interventional Cardiology

## 2021-10-31 NOTE — Telephone Encounter (Signed)
Pt states that shea has questions about her medications. Pt would like a call back. Please advise ?

## 2021-10-31 NOTE — Telephone Encounter (Signed)
I spoke with patient.  She reports pharmacy filled amiodarone with pacerone.  I told her this was a different name for the medication and was OK to take ?

## 2021-11-06 ENCOUNTER — Telehealth: Payer: Self-pay | Admitting: Interventional Cardiology

## 2021-11-06 NOTE — Telephone Encounter (Signed)
We need to repeat an ECG.  If she is in NSR, can decrease to 200 mg daily. ?

## 2021-11-06 NOTE — Telephone Encounter (Signed)
Spoke with the patient who states that she has been taking amiodarone 400 mg BID since last Thursday 4/27. She states that over the weekend she still had some elevated heart rates up to 110, but her monitor showed that her rhythm was regular. She states that starting Monday her heart rate had come down and has stayed in the 60s. She reports that she has only had a few readings of irregular heart rates but overall it has been normal. She reports that since Sunday she has been feeling a bit more short of breath with exertion. She reports she has also had some lightheadedness but does not feel like she is going to pass out. She reports headaches as well. She denies shortness of breath at rest but does report having trouble taking deep breaths at times. She reports that these symptoms started in the past week since starting amiodarone and is concerned they are side effects. Starting today she has decreased amiodarone to 400 mg once daily. She is wondering if she can go ahead and drop to 200 mg. She also would like to know if she is still going to need her cardioversion. Advised her that I will check with Dr. Irish Lack about her amiodarone and that we will have to determine whether she needs the cardioversion based on whether she is in normal rhythm or not.  ?I have also advised patient that if she starts to have shortness of breath that is worse and at rest then she needs to go to the ER. Patient verbalized understanding.  ?

## 2021-11-06 NOTE — Telephone Encounter (Signed)
Pt c/o medication issue: ? ?1. Name of Medication: Amiodarone ? ?2. How are you currently taking this medication (dosage and times per day)? 2 times daily ? ?3. Are you having a reaction (difficulty breathing--STAT)?  ? ?4. What is your medication issue?  Dizzy spells, lightheaded, real bad shortness of breath, knees feel hollow- she wants to know if she can try taking 200 mg instead of 400 mg the last blood pressure reading today was 130/62 and pulse 63 ? ?

## 2021-11-07 ENCOUNTER — Ambulatory Visit (INDEPENDENT_AMBULATORY_CARE_PROVIDER_SITE_OTHER): Payer: Medicare Other | Admitting: Internal Medicine

## 2021-11-07 VITALS — BP 120/64 | HR 64 | Ht 63.0 in | Wt 275.0 lb

## 2021-11-07 DIAGNOSIS — I480 Paroxysmal atrial fibrillation: Secondary | ICD-10-CM | POA: Diagnosis not present

## 2021-11-07 MED ORDER — AMIODARONE HCL 200 MG PO TABS: 200.0000 mg | ORAL_TABLET | Freq: Every day | ORAL | 3 refills | Status: DC

## 2021-11-07 NOTE — Progress Notes (Signed)
? ?  Nurse Visit  ? ?Date of Encounter: 11/07/2021 ?ID: Carrie Newton, DOB [birth year], MRN [id number] ? ?PCP:  Mayra Neer, MD ?  ?[facility] ?Cardiologist:  Larae Grooms, MD    ? ? ?Visit Details  ? ?VS:  BP 120/64   Pulse 64   Ht '5\' 3"'$  (1.6 m)   Wt 275 lb (124.7 kg)   SpO2 98%   BMI 48.71 kg/m?  , BMI Body mass index is 48.71 kg/m?. ? ?Wt Readings from Last 3 Encounters:  ?11/07/21 275 lb (124.7 kg)  ?10/29/21 269 lb (122 kg)  ?04/18/21 274 lb 6.4 oz (124.5 kg)  ?  ? ?Reason for visit: EKG after starting amiodarone to see if the patient was back in NSR.  ?Performed today: Vitals, EKG, Provider consulted:Dr. Chauncey Cruel, and Education ?Changes (medications, testing, etc.) : EKG showed NSR,  decreased amiodarone to 200 mg daily ?Length of Visit: 40 minutes ? ?Patient is feeling better today. Denies any lightheadedness or dizziness. She is still having some shortness of breath but only with exertion. She reports feeling better on amiodarone 400 mg once daily. EKG reviewed by DOD, Dr. Chauncey Cruel, NSR. Will decrease amiodarone to 200 mg daily. She wants to keep cardioversion as scheduled in case she has any other problems with amiodarone or goes back into afib. She is going to call next week with an update.  ? ? ?Medications Adjustments/Labs and Tests Ordered: ?Orders Placed This Encounter  ?Procedures  ? EKG 12-Lead  ? ?Meds ordered this encounter  ?Medications  ? amiodarone (PACERONE) 200 MG tablet  ?  Sig: Take 1 tablet (200 mg total) by mouth daily.  ?  Dispense:  90 tablet  ?  Refill:  3  ?  Dose decreased. Do not fill until patient requests.  ? ? ? ?Signed, ?Antonieta Iba, RN  ?11/07/2021 4:04 PM ? ?

## 2021-11-07 NOTE — Telephone Encounter (Signed)
Left message to call back. Will schedule nurse visit for today at 3 pm.  ?

## 2021-11-07 NOTE — Telephone Encounter (Signed)
Spoke to the patient and set up NV for 3 pm today.  ?Verbalized agreement.  ?

## 2021-11-14 ENCOUNTER — Encounter (HOSPITAL_COMMUNITY): Payer: Self-pay | Admitting: Cardiovascular Disease

## 2021-11-14 NOTE — Progress Notes (Signed)
Attempted to obtain medical history via telephone, unable to reach at this time. I left a voicemail to return pre surgical testing department's phone call.  

## 2021-11-18 ENCOUNTER — Telehealth: Payer: Self-pay | Admitting: Interventional Cardiology

## 2021-11-18 NOTE — Telephone Encounter (Signed)
Patient called and wanted to know if Dr. Irish Lack wants to cancel her cardioversion or not. Want to talk with Dr. Irish Lack or nurse in regards to procedure. ?

## 2021-11-19 ENCOUNTER — Other Ambulatory Visit: Payer: Medicare Other

## 2021-11-19 NOTE — Telephone Encounter (Signed)
Patient had previously converted to SR but did not want to cancel cardioversion until closer to procedure date.  ?Reviewed with Dr Irish Lack and if patient feels she is still in [place] it is OK to cancel cardioversion. Does not need to come in for another EKG.  Follow up in afib clinic as planned. ?I spoke with patient. She feels she is in normal rhythm.  Heart rate 56 today.  States her bp machine showed brief readings of irregular heart rhythm on 5/8, 5/13 and 5/15.  These were one time readings.  Not sustained.  Was having sustained irregular readings when in afib before.  All readings since 5/15 have been regular.  ?Patient states she still feels a little winded with exertion such as cleaning her garage but otherwise shortness of breath has improved. ?Patient reports PCP is going to recheck CBC in about 10 days.   Pre cardioversion lab work scheduled for today canceled. ?Patient would like to wait until tomorrow to cancel cardioversion.  She will call tomorrow with update and if she feels she is normal rhythm cardioversion can be canceled at that time.  ?

## 2021-11-20 NOTE — Telephone Encounter (Signed)
Patient calling back to update that her BP and HR have been fine. She would like a call back confirming whether her procedure will he cancelled.

## 2021-11-20 NOTE — Telephone Encounter (Signed)
Spoke with the patient and advised her that her cardioversion had been cancelled. Patient verbalized understanding.

## 2021-11-21 ENCOUNTER — Encounter (HOSPITAL_COMMUNITY): Admission: RE | Payer: Self-pay | Source: Home / Self Care

## 2021-11-21 ENCOUNTER — Ambulatory Visit (HOSPITAL_COMMUNITY): Admission: RE | Admit: 2021-11-21 | Payer: Medicare Other | Source: Home / Self Care | Admitting: Cardiovascular Disease

## 2021-11-21 SURGERY — CARDIOVERSION
Anesthesia: General

## 2021-12-04 DIAGNOSIS — H539 Unspecified visual disturbance: Secondary | ICD-10-CM

## 2021-12-04 HISTORY — DX: Unspecified visual disturbance: H53.9

## 2021-12-10 ENCOUNTER — Ambulatory Visit (HOSPITAL_COMMUNITY)
Admission: RE | Admit: 2021-12-10 | Discharge: 2021-12-10 | Disposition: A | Discharge status: Home / Self Care | Payer: Medicare Other | Source: Ambulatory Visit | Attending: Physician Assistant | Admitting: Physician Assistant

## 2021-12-10 VITALS — BP 130/64 | HR 49 | Ht 63.0 in | Wt 270.8 lb

## 2021-12-10 DIAGNOSIS — Z7901 Long term (current) use of anticoagulants: Secondary | ICD-10-CM | POA: Diagnosis not present

## 2021-12-10 DIAGNOSIS — Z6841 Body Mass Index (BMI) 40.0 and over, adult: Secondary | ICD-10-CM | POA: Insufficient documentation

## 2021-12-10 DIAGNOSIS — I10 Essential (primary) hypertension: Secondary | ICD-10-CM | POA: Diagnosis not present

## 2021-12-10 DIAGNOSIS — E669 Obesity, unspecified: Secondary | ICD-10-CM | POA: Diagnosis not present

## 2021-12-10 DIAGNOSIS — D6869 Other thrombophilia: Secondary | ICD-10-CM | POA: Diagnosis not present

## 2021-12-10 DIAGNOSIS — I4819 Other persistent atrial fibrillation: Secondary | ICD-10-CM | POA: Insufficient documentation

## 2021-12-10 MED ORDER — AMIODARONE HCL 200 MG PO TABS: 100.0000 mg | ORAL_TABLET | Freq: Every day | ORAL | 3 refills | Status: DC

## 2021-12-10 NOTE — Progress Notes (Signed)
Primary Care Physician: Mayra Neer, MD Primary Cardiologist: Dr Irish Lack  Primary Electrophysiologist: none Referring Physician: Dr Emilio Aspen is a 75 y.o. female with a history of DM, HTN, atrial fibrillation who presents for consultation in the [facility].  The patient was initially diagnosed with atrial fibrillation remotely. Patient is on Eliquis for a CHADS2VASC score of 5. She was started on amiodarone on 10/29/21 with plan for DCCV. She chemically converted to SR. Today, patient reports that she feels well but has noted low heart rates at home, 40s bpm. No dizziness or presyncope.   Today, she denies symptoms of palpitations, chest pain, shortness of breath, orthopnea, PND, lower extremity edema, dizziness, presyncope, syncope, snoring, daytime somnolence, bleeding, or neurologic sequela. The patient is tolerating medications without difficulties and is otherwise without complaint today.    Atrial Fibrillation Risk Factors:  she does not have symptoms or diagnosis of sleep apnea. she does not have a history of rheumatic fever.   she has a BMI of Body mass index is 47.97 kg/m.Marland Kitchen Filed Weights   12/10/21 1426  Weight: 122.8 kg    Family History  Problem Relation Age of Onset   Hypertension Father    Heart attack Father    Stroke Neg Hx      Atrial Fibrillation Management history:  Previous antiarrhythmic drugs: amiodarone  Previous cardioversions: none Previous ablations: none CHADS2VASC score: 5 Anticoagulation history: Eliquis   Past Medical History:  Diagnosis Date   Complication of anesthesia    post op nausea and vomiting   Diabetes ([place])    Type 2   History of echocardiogram 10/2012   Echo 4/14:  EF 60-65%, mild LAE, MAC, impaired relaxation   Hypertension    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 Wyatt Portela  06-20-2021   PAF (paroxysmal atrial fibrillation) [facility])    Past Surgical History:  Procedure Laterality Date   BIOPSY  04/05/2019   Procedure: BIOPSY;  Surgeon: Wonda Horner, MD;  Location[facility] ENDOSCOPY;  Service: Endoscopy;;   BREAST LUMPECTOMY  1992   Left breast   CARPAL TUNNEL RELEASE     Left hand 05/2012 and right hand dec 2013   COLONOSCOPY WITH PROPOFOL N/A 04/05/2019   Procedure: COLONOSCOPY WITH PROPOFOL;  Surgeon: Wonda Horner, MD;  Location: [place];  Service: Endoscopy;  Laterality: N/A;   ESOPHAGOGASTRODUODENOSCOPY (EGD) WITH PROPOFOL N/A 04/05/2019   Procedure: ESOPHAGOGASTRODUODENOSCOPY (EGD) WITH PROPOFOL;  Surgeon: Wonda Horner, MD;  Location[facility] ENDOSCOPY;  Service: Endoscopy;  Laterality: N/A;   TOTAL KNEE ARTHROPLASTY  07/2009   Left knee   TRIGGER FINGER RELEASE  2015   Left thumb    Current Outpatient Medications  Medication Sig Dispense Refill   ACCU-CHEK AVIVA PLUS test strip 1 each by Other route as needed for other (SUGAR CHECK).      alendronate (FOSAMAX) 70 MG tablet Take 70 mg by mouth once a week.     amiodarone (PACERONE) 200 MG tablet Take 1 tablet (200 mg total) by mouth daily. 90 tablet 3   atorvastatin (LIPITOR) 20 MG tablet Take 20 mg by mouth daily at 6 PM.      buPROPion (WELLBUTRIN XL) 300 MG 24 hr tablet Take 300 mg by mouth daily. Take 1 tab daily     Calcium-Phosphorus-Vitamin D (CITRACAL +D3 PO) Take 1 tablet by mouth daily.  diclofenac Sodium (VOLTAREN) 1 % GEL Apply topically daily as needed.     ELIQUIS 5 MG TABS tablet TAKE 1 TABLET BY MOUTH  TWICE DAILY 180 tablet 3   felodipine (PLENDIL) 10 MG 24 hr tablet Take 10 mg daily by mouth.     furosemide (LASIX) 40 MG tablet Take 1 tablet (40 mg total) by mouth daily as needed for fluid or edema (if you gain 3 pounds over night or 5 pounds in a week). 45 tablet 3   HYDROcodone-acetaminophen (NORCO) 10-325 MG per tablet Take 2 tablets by mouth daily. Take as directed     Insulin  Degludec (TRESIBA) 100 UNIT/ML SOLN Inject into the skin. Use depends on blood sugar level.     insulin lispro (HUMALOG) 100 UNIT/ML injection Inject into the skin as needed. Use as needed for blood sugar levels before meals.     levothyroxine (SYNTHROID) 88 MCG tablet Take 88 mcg by mouth daily before breakfast. Alternate with 75 mcg     levothyroxine (SYNTHROID, LEVOTHROID) 75 MCG tablet Take 75 mcg by mouth daily before breakfast. Alternate with 88 mcg tab     losartan (COZAAR) 50 MG tablet Take 0.5 tablets by mouth daily.     Multiple Vitamin (MULTIVITAMIN) tablet Take 1 tablet by mouth daily.     mupirocin ointment (BACTROBAN) 2 % Apply 1 application topically continuous as needed (CUTS).      OZEMPIC, 1 MG/DOSE, 2 MG/1.5ML SOPN Inject 1 mg into the skin once a week.     pantoprazole (PROTONIX) 40 MG tablet Take 40 mg by mouth every other day.      sertraline (ZOLOFT) 100 MG tablet Take 100 mg by mouth daily.     vitamin C (ASCORBIC ACID) 500 MG tablet Take 1 tablet by mouth daily as needed.     No current facility-administered medications for this encounter.    Allergies  Allergen Reactions   Metoprolol Tartrate Other (See Comments)    Junctional Bradycardia    Neosporin [Neomycin-Bacitracin Zn-Polymyx] Rash   Norvasc [Amlodipine Besylate] Rash   Other Rash    ALLERGEN: Oxycodone Hcl-oxycodone-asa (Other   Percodan [Oxycodone-Aspirin] Other (See Comments)    Tingling     Social History   Socioeconomic History   Marital status: Divorced    Spouse name: Not on file   Number of children: Not on file   Years of education: Not on file   Highest education level: Not on file  Occupational History   Not on file  Tobacco Use   Smoking status: Never   Smokeless tobacco: Never  Vaping Use   Vaping Use: Never used  Substance and Sexual Activity   Alcohol use: No    Alcohol/week: 0.0 standard drinks   Drug use: No   Sexual activity: Not on file  Other Topics Concern   Not on  file  Social History Narrative   Not on file   Social Determinants of Health   Financial Resource Strain: Not on file  Food Insecurity: Not on file  Transportation Needs: Not on file  Physical Activity: Not on file  Stress: Not on file  Social Connections: Not on file  Intimate Partner Violence: Not on file     ROS- All systems are reviewed and negative except as per the HPI above.  Physical Exam: Vitals:   12/10/21 1426  Weight: 122.8 kg  Height: _0  (1.6 m)    GEN- The patient is a well appearing obese female, alert and  oriented x 3 today.   Head- normocephalic, atraumatic Eyes-  Sclera clear, conjunctiva pink Ears- hearing intact Oropharynx- clear Neck- supple  Lungs- Clear to ausculation bilaterally, normal work of breathing Heart- Regular rate and rhythm, bradycardia, no murmurs, rubs or gallops  GI- soft, NT, ND, + BS Extremities- no clubbing, cyanosis, or edema MS- no significant deformity or atrophy Skin- no rash or lesion Psych- euthymic mood, full affect Neuro- strength and sensation are intact  Wt Readings from Last 3 Encounters:  12/10/21 122.8 kg  11/07/21 124.7 kg  10/29/21 122 kg    EKG today demonstrates  Junctional bradycardia Vent. rate 49 BPM PR interval * ms QRS duration 92 ms QT/QTcB 468/422 ms  Echo 05/19/21 demonstrated  1. Left ventricular ejection fraction, by estimation, is 60 to 65%. Left  ventricular ejection fraction by 3D volume is 62 %. The left ventricle has normal function. The left ventricle has no regional wall motion  abnormalities. Left ventricular diastolic  parameters are consistent with Grade I diastolic dysfunction (impaired relaxation).   2. Right ventricular systolic function is normal. The right ventricular  size is normal. There is normal pulmonary artery systolic pressure. The  estimated right ventricular systolic pressure is 41.6 mmHg.   3. Left atrial size was moderately dilated.   4. The mitral valve is  abnormal. Trivial mitral valve regurgitation.  Moderate mitral annular calcification.   5. The aortic valve is tricuspid. Aortic valve regurgitation is not  visualized.   6. The inferior vena cava is normal in size with greater than 50%  respiratory variability, suggesting right atrial pressure of 3 mmHg.   Epic records are reviewed at length today  CHA2DS2-VASc Score = 5  The patient's score is based upon: CHF History: 0 HTN History: 1 Diabetes History: 1 Stroke History: 0 Vascular Disease History: 0 Age Score: 2 Gender Score: 1       ASSESSMENT AND PLAN: 1. Persistent Atrial Fibrillation (ICD10:  I48.19) The patient's CHA2DS2-VASc score is 5, indicating a 7.2% annual risk of stroke.   Patient chemically converted with amiodarone, now in a junctional rhythm but asymptomatic.  Will decrease amiodarone to 100 mg daily. On review, she did have a junctional rhythm on metoprolol several years ago.  Patient declined lab work today as she is seeing her PCP tomorrow and will have lab work done there.  Will check liver function and TSH at follow up.  Continue Eliquis 5 mg BID  2. Secondary Hypercoagulable State (ICD10:  D68.69) The patient is at significant risk for stroke/thromboembolism based upon her CHA2DS2-VASc Score of 5.  Continue Apixaban (Eliquis).   3. Obesity Body mass index is 47.97 kg/m. Lifestyle modification was discussed at length including regular exercise and weight reduction.  4. HTN Stable, no changes today.   Follow up in the AF clinic in 6-8 weeks.    [facility] [address] [place], [place] [address] [phone] 12/10/2021 2:36 PM

## 2021-12-10 NOTE — Patient Instructions (Signed)
Decrease Amiodarone to '100mg'$  once a day (1/2 of the '200mg'$  tablet)

## 2021-12-25 DIAGNOSIS — D332 Benign neoplasm of brain, unspecified: Secondary | ICD-10-CM

## 2021-12-25 HISTORY — DX: Benign neoplasm of brain, unspecified: D33.2

## 2021-12-29 ENCOUNTER — Telehealth: Payer: Self-pay | Admitting: Interventional Cardiology

## 2021-12-29 NOTE — Telephone Encounter (Signed)
Pt would like ask Dr. Hoyle Barr nurse a couple of questions. She did not want to inform me of those questions, she would just like to speak to his nurse or a nurse. She also wanted to inform Dr. Eldridge Dace that she had MRI from her eye doctor and they will send the results over to him.

## 2022-01-12 ENCOUNTER — Telehealth: Payer: Self-pay | Admitting: Pharmacist

## 2022-01-12 NOTE — Telephone Encounter (Signed)
Medication Management by way of [facility] is conducting a phase 3 clinical trial sponsored by [employer] comparing apixaban vs asundexian, which is a new factor XI a inhibitor, for patients with AF. Dr. Adrian Prows is the PI. The primary objective of the trial is to compare efficacy (CVA & TE prevention) & safety. Phase 2 data has indicated comparable efficacy in terms of CVA and thromboembolic risk reduction and a lower risk of major bleeding with asundexian. Ms. Leatrice Jewels PCP, Dr. Mayra Neer, has referred Ms. Tilson for consideration to screen for the trial. We've discussed the trial with Ms. Etzkorn and she is interested. Prior to proceeding, I wanted to check with you to see if you are OK with Ms. Shawgo participating in the trial.  Thanks  Gaspar Bidding

## 2022-01-13 NOTE — Telephone Encounter (Signed)
OK, thank you. I will keep you posted on her progress.

## 2022-01-14 ENCOUNTER — Telehealth: Payer: Self-pay | Admitting: Interventional Cardiology

## 2022-01-14 NOTE — Telephone Encounter (Signed)
Pt would like for nurse Fraser Din) to return her call. Please advise

## 2022-01-14 NOTE — Telephone Encounter (Signed)
Called patient back. She wants to inform Dr. Irish Lack of a few things and had some questions.  1- Patient wants to know if Dr. Irish Lack got the MRI results from the June 22nd study. This was an MRI that the eye doctor ordered, due to patient having double and blurred vision, and to rule out brain stem stroke. 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 Her doctor will repeat test in September to check for growth.   2- Update on lab work done on June 30, by PCP, Dr. Brigitte Pulse. Hemoglobin 10.6 and Iron serum 43, both numbers are an improvement.   3- Started seeing the A. FIB clinic, Clint Fenton. He cut Amiodarone down to 100 mg daily, and EKG was good. Patient wants to discuss reducing dose down some more at next office visit if possible. The side effects she has read about scare her.  4- Referred to [facility] for "medication management study", an A. Fib Trial sponsored by [employer]. Trial will last 33 months- will be taking Asundexian (new drug) or placebo, will not be able to take eliquis. They will see her every 3 months in person if she does the trial. Patient stated, she will not do it, period, if Dr. Irish Lack does not approve.  Will send message to Dr. Irish Lack and his nurse.

## 2022-01-15 NOTE — Telephone Encounter (Signed)
I am not that familiar with this study.  It is testing a different blood thinner.  These types of trials are designed to minimize risk to patient.  I will try to find out more details.  I would verify that if she receives placebo, would she be off of blood thinners completely.?  Amio 100 mg daily is quite a low dose so risk of side effects long term should be low.

## 2022-01-15 NOTE — Telephone Encounter (Signed)
Gaspar Bidding,  I got antoher message about this study and it says patient could receive asundexian vs. Placebo.  I thought from your note it would be asundexian vs. Eliquis.  Please clarify. Thanks.  JV

## 2022-01-16 NOTE — Telephone Encounter (Signed)
See other phone note regarding study. I spoke with patient and gave her information from Dr Irish Lack regarding Amiodarone. I told patient we have not received MRI report

## 2022-01-16 NOTE — Telephone Encounter (Signed)
There is no placebo in this trial. It is a 1:1 randomization ratio to Eliquis or Asundexian.   Grayling Congress, I am ok with her doing the trial.  THere is no placebo.   JV

## 2022-01-16 NOTE — Telephone Encounter (Signed)
Patient notified

## 2022-02-10 ENCOUNTER — Ambulatory Visit (HOSPITAL_COMMUNITY): Payer: Medicare Other | Admitting: Physician Assistant

## 2022-03-05 ENCOUNTER — Encounter (HOSPITAL_COMMUNITY): Payer: Self-pay | Admitting: Physician Assistant

## 2022-03-05 ENCOUNTER — Ambulatory Visit (HOSPITAL_COMMUNITY)
Admission: RE | Admit: 2022-03-05 | Discharge: 2022-03-05 | Disposition: A | Discharge status: Home / Self Care | Payer: Medicare Other | Source: Ambulatory Visit | Attending: Physician Assistant | Admitting: Physician Assistant

## 2022-03-05 VITALS — BP 130/70 | HR 59 | Ht 63.0 in | Wt 253.4 lb

## 2022-03-05 DIAGNOSIS — Z794 Long term (current) use of insulin: Secondary | ICD-10-CM | POA: Insufficient documentation

## 2022-03-05 DIAGNOSIS — E669 Obesity, unspecified: Secondary | ICD-10-CM | POA: Diagnosis not present

## 2022-03-05 DIAGNOSIS — Z6841 Body Mass Index (BMI) 40.0 and over, adult: Secondary | ICD-10-CM | POA: Insufficient documentation

## 2022-03-05 DIAGNOSIS — Z713 Dietary counseling and surveillance: Secondary | ICD-10-CM | POA: Diagnosis not present

## 2022-03-05 DIAGNOSIS — D6869 Other thrombophilia: Secondary | ICD-10-CM | POA: Insufficient documentation

## 2022-03-05 DIAGNOSIS — Z79899 Other long term (current) drug therapy: Secondary | ICD-10-CM | POA: Insufficient documentation

## 2022-03-05 DIAGNOSIS — Z7984 Long term (current) use of oral hypoglycemic drugs: Secondary | ICD-10-CM | POA: Insufficient documentation

## 2022-03-05 DIAGNOSIS — I10 Essential (primary) hypertension: Secondary | ICD-10-CM | POA: Diagnosis not present

## 2022-03-05 DIAGNOSIS — I4819 Other persistent atrial fibrillation: Secondary | ICD-10-CM | POA: Diagnosis present

## 2022-03-05 DIAGNOSIS — Z7985 Long-term (current) use of injectable non-insulin antidiabetic drugs: Secondary | ICD-10-CM | POA: Diagnosis not present

## 2022-03-05 DIAGNOSIS — Z7901 Long term (current) use of anticoagulants: Secondary | ICD-10-CM | POA: Diagnosis not present

## 2022-03-05 DIAGNOSIS — E119 Type 2 diabetes mellitus without complications: Secondary | ICD-10-CM | POA: Diagnosis not present

## 2022-03-05 DIAGNOSIS — D329 Benign neoplasm of meninges, unspecified: Secondary | ICD-10-CM | POA: Diagnosis not present

## 2022-03-05 LAB — COMPREHENSIVE METABOLIC PANEL
ALT: 25 U/L (ref 0–44)
AST: 34 U/L (ref 15–41)
Albumin: 3.5 g/dL (ref 3.5–5.0)
Alkaline Phosphatase: 59 U/L (ref 38–126)
Anion gap: 8 (ref 5–15)
BUN: 10 mg/dL (ref 8–23)
CO2: 24 mmol/L (ref 22–32)
Calcium: 8.9 mg/dL (ref 8.9–10.3)
Chloride: 107 mmol/L (ref 98–111)
Creatinine, Ser: 0.88 mg/dL (ref 0.44–1.00)
GFR, Estimated: >60 mL/min (ref >60)
Glucose, Bld: 212 mg/dL — ABNORMAL HIGH (ref 70–99)
Potassium: 4.4 mmol/L (ref 3.5–5.1)
Sodium: 139 mmol/L (ref 135–145)
Total Bilirubin: 0.5 mg/dL (ref 0.3–1.2)
Total Protein: 6.3 g/dL — ABNORMAL LOW (ref 6.5–8.1)

## 2022-03-05 LAB — TSH: TSH: 2.698 u[IU]/mL (ref 0.350–4.500)

## 2022-03-05 NOTE — Progress Notes (Signed)
Primary Care Physician: Mayra Neer, MD Primary Cardiologist: Dr Irish Lack  Primary Electrophysiologist: none Referring Physician: Dr Emilio Aspen is a 76 y.o. female with a history of DM, HTN, atrial fibrillation who presents for follow up in the [facility].  The patient was initially diagnosed with atrial fibrillation remotely. Patient is on Eliquis for a CHADS2VASC score of 5. She was started on amiodarone on 10/29/21 with plan for DCCV. She chemically converted to SR.   On follow up today, patient reports that she has done well from a cardiac standpoint. She has rare, brief palpitations, nothing sustained. She has developed double vision and is seeing both opthalmology and neurology. She has been diagnosed with a meningioma. No bleeding issues on anticoagulation.   Today, she denies symptoms of palpitations, chest pain, shortness of breath, orthopnea, PND, lower extremity edema, dizziness, presyncope, syncope, snoring, daytime somnolence, bleeding, or neurologic sequela. The patient is tolerating medications without difficulties and is otherwise without complaint today.    Atrial Fibrillation Risk Factors:  she does not have symptoms or diagnosis of sleep apnea. she does not have a history of rheumatic fever.   she has a BMI of Body mass index is 44.89 kg/m.Marland Kitchen Filed Weights   03/05/22 1522  Weight: 114.9 kg    Family History  Problem Relation Age of Onset   Hypertension Father    Heart attack Father    Stroke Neg Hx      Atrial Fibrillation Management history:  Previous antiarrhythmic drugs: amiodarone  Previous cardioversions: none Previous ablations: none CHADS2VASC score: 5 Anticoagulation history: Eliquis   Past Medical History:  Diagnosis Date   Complication of anesthesia    post op nausea and vomiting   Diabetes ([place])    Type 2   History of echocardiogram 10/2012   Echo 4/14:  EF 60-65%, mild LAE, MAC,  impaired relaxation   Hypertension    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 Wyatt Portela 06-20-2021   PAF (paroxysmal atrial fibrillation) [facility])    Past Surgical History:  Procedure Laterality Date   BIOPSY  04/05/2019   Procedure: BIOPSY;  Surgeon: Wonda Horner, MD;  Location[facility] ENDOSCOPY;  Service: Endoscopy;;   BREAST LUMPECTOMY  1992   Left breast   CARPAL TUNNEL RELEASE     Left hand 05/2012 and right hand dec 2013   COLONOSCOPY WITH PROPOFOL N/A 04/05/2019   Procedure: COLONOSCOPY WITH PROPOFOL;  Surgeon: Wonda Horner, MD;  Location: [facility] ENDOSCOPY;  Service: Endoscopy;  Laterality: N/A;   ESOPHAGOGASTRODUODENOSCOPY (EGD) WITH PROPOFOL N/A 04/05/2019   Procedure: ESOPHAGOGASTRODUODENOSCOPY (EGD) WITH PROPOFOL;  Surgeon: Wonda Horner, MD;  Location: [facility] ENDOSCOPY;  Service: Endoscopy;  Laterality: N/A;   TOTAL KNEE ARTHROPLASTY  07/2009   Left knee   TRIGGER FINGER RELEASE  2015   Left thumb    Current Outpatient Medications  Medication Sig Dispense Refill   ACCU-CHEK AVIVA PLUS test strip 1 each by Other route as needed for other (SUGAR CHECK).      alendronate (FOSAMAX) 70 MG tablet Take 70 mg by mouth once a week.     amiodarone (PACERONE) 200 MG tablet Take 0.5 tablets (100 mg total) by mouth daily. 90 tablet 3   atorvastatin (LIPITOR) 20 MG tablet Take 20 mg by mouth daily at 6 PM.      buPROPion (WELLBUTRIN XL) 300 MG 24  hr tablet Take 300 mg by mouth daily. Take 1 tab daily     Calcium-Phosphorus-Vitamin D (CITRACAL +D3 PO) Take 1 tablet by mouth daily.     diclofenac Sodium (VOLTAREN) 1 % GEL Apply topically daily as needed.     ELIQUIS 5 MG TABS tablet TAKE 1 TABLET BY MOUTH  TWICE DAILY 180 tablet 3   felodipine (PLENDIL) 10 MG 24 hr tablet Take 10 mg daily by mouth.     furosemide (LASIX) 40 MG tablet Take 1 tablet (40 mg total) by mouth daily as needed for fluid or edema (if you  gain 3 pounds over night or 5 pounds in a week). 45 tablet 3   HYDROcodone-acetaminophen (NORCO) 10-325 MG per tablet Take 2 tablets by mouth daily. Take as directed     Insulin Degludec (TRESIBA) 100 UNIT/ML SOLN Inject into the skin. Use depends on blood sugar level.     insulin lispro (HUMALOG) 100 UNIT/ML injection Inject into the skin as needed. Use as needed for blood sugar levels before meals.     levothyroxine (SYNTHROID) 88 MCG tablet Take 88 mcg by mouth daily before breakfast. Alternate with 75 mcg     levothyroxine (SYNTHROID, LEVOTHROID) 75 MCG tablet Take 75 mcg by mouth daily before breakfast. Alternate with 88 mcg tab     losartan (COZAAR) 50 MG tablet Take 0.5 tablets by mouth daily.     Multiple Vitamin (MULTIVITAMIN) tablet Take 1 tablet by mouth daily.     mupirocin ointment (BACTROBAN) 2 % Apply 1 application topically continuous as needed (CUTS).      OZEMPIC, 1 MG/DOSE, 2 MG/1.5ML SOPN Inject 1 mg into the skin once a week.     pantoprazole (PROTONIX) 40 MG tablet Take 40 mg by mouth every other day.      sertraline (ZOLOFT) 100 MG tablet Take 100 mg by mouth daily.     vitamin C (ASCORBIC ACID) 500 MG tablet Take 1 tablet by mouth daily as needed.     No current facility-administered medications for this encounter.    Allergies  Allergen Reactions   Metoprolol Tartrate Other (See Comments)    Junctional Bradycardia    Neosporin [Neomycin-Bacitracin Zn-Polymyx] Rash   Norvasc [Amlodipine Besylate] Rash   Other Rash    ALLERGEN: Oxycodone Hcl-oxycodone-asa (Other   Percodan [Oxycodone-Aspirin] Other (See Comments)    Tingling     Social History   Socioeconomic History   Marital status: Divorced    Spouse name: Not on file   Number of children: Not on file   Years of education: Not on file   Highest education level: Not on file  Occupational History   Not on file  Tobacco Use   Smoking status: Never   Smokeless tobacco: Never   Tobacco comments:     Never smoke 03/05/22  Vaping Use   Vaping Use: Never used  Substance and Sexual Activity   Alcohol use: No    Alcohol/week: 0.0 standard drinks of alcohol   Drug use: No   Sexual activity: Not on file  Other Topics Concern   Not on file  Social History Narrative   Not on file   Social Determinants of Health   Financial Resource Strain: Not on file  Food Insecurity: Not on file  Transportation Needs: Not on file  Physical Activity: Not on file  Stress: Not on file  Social Connections: Not on file  Intimate Partner Violence: Not on file     ROS- All  systems are reviewed and negative except as per the HPI above.  Physical Exam: Vitals:   03/05/22 1522  BP: 130/70  Pulse: (!) 59  Weight: 114.9 kg  Height: _0  (1.6 m)     GEN- The patient is a well appearing obese elderly female, alert and oriented x 3 today.   HEENT-head normocephalic, atraumatic, sclera clear, conjunctiva pink, hearing intact, trachea midline. Lungs- Clear to ausculation bilaterally, normal work of breathing Heart- Regular rate and rhythm, no murmurs, rubs or gallops  GI- soft, NT, ND, + BS Extremities- no clubbing, cyanosis, or edema MS- no significant deformity or atrophy Skin- no rash or lesion Psych- euthymic mood, full affect Neuro- strength and sensation are intact   Wt Readings from Last 3 Encounters:  03/05/22 114.9 kg  12/10/21 122.8 kg  11/07/21 124.7 kg    EKG today demonstrates  SB Vent. rate 59 BPM PR interval 172 ms QRS duration 94 ms QT/QTcB 446/441 ms  Echo 05/19/21 demonstrated  1. Left ventricular ejection fraction, by estimation, is 60 to 65%. Left  ventricular ejection fraction by 3D volume is 62 %. The left ventricle has normal function. The left ventricle has no regional wall motion  abnormalities. Left ventricular diastolic  parameters are consistent with Grade I diastolic dysfunction (impaired relaxation).   2. Right ventricular systolic function is normal. The  right ventricular  size is normal. There is normal pulmonary artery systolic pressure. The  estimated right ventricular systolic pressure is 37.3 mmHg.   3. Left atrial size was moderately dilated.   4. The mitral valve is abnormal. Trivial mitral valve regurgitation.  Moderate mitral annular calcification.   5. The aortic valve is tricuspid. Aortic valve regurgitation is not  visualized.   6. The inferior vena cava is normal in size with greater than 50%  respiratory variability, suggesting right atrial pressure of 3 mmHg.   Epic records are reviewed at length today  CHA2DS2-VASc Score = 5  The patient's score is based upon: CHF History: 0 HTN History: 1 Diabetes History: 1 Stroke History: 0 Vascular Disease History: 0 Age Score: 2 Gender Score: 1       ASSESSMENT AND PLAN: 1. Persistent Atrial Fibrillation (ICD10:  I48.19) The patient's CHA2DS2-VASc score is 5, indicating a 7.2% annual risk of stroke.   Patient appears to be maintaining SR with only rare palpitations.  Continue amiodarone 100 mg daily Check cmet/TSH today. Continue Eliquis 5 mg BID  2. Secondary Hypercoagulable State (ICD10:  D68.69) The patient is at significant risk for stroke/thromboembolism based upon her CHA2DS2-VASc Score of 5.  Continue Apixaban (Eliquis).   3. Obesity Body mass index is 44.89 kg/m. Lifestyle modification was discussed and encouraged including regular physical activity and weight reduction.  4. HTN Stable, no changes today.   Follow up with Dr Irish Lack as scheduled. AF clinic in 6 months.    [facility] [address] [place], [place] [address] [phone] 03/05/2022 3:59 PM

## 2022-04-20 NOTE — Progress Notes (Unsigned)
Cardiology Office Note   Date:  04/21/2022   ID:  Carrie Newton, DOB Jun 06, 1946, MRN [id number]  PCP:  Carrie Neer, MD    No chief complaint on file.  Atrial fibrillation  Wt Readings from Last 3 Encounters:  04/21/22 251 lb (113.9 kg)  03/05/22 253 lb 6.4 oz (114.9 kg)  12/10/21 270 lb 12.8 oz (122.8 kg)       History of Present Illness: Carrie Newton is a 76 y.o. female  Who has had PAF diagnosed several years ago.  EF 60-65% in 2014.   LA dimension of 4.7 cm.     She was treated with Xarelto for stroke prevention.  In November and December 2016, she experienced episodic nose bleeding. She was seen in the office. She was sent to ENT. There was a scab found on the bleeding area in her nose.   She has had some issues with hypoglycemia.   EGD/Colonoscopy in 2020 with Dr. Penelope Newton- no ulcers, no polyps.   In 2021, she had a kidney stone.  She had some hematuria in 3/21.  Has a kidney stone that hsa grown per her report in 2022. Has occasional flank pain.   She felt higher heart rates in April 2023.  Amiodarone was started and she chemically converted to sinus rhythm.  FOund to have a meningioma in June 2023- she had double vision.  Stable size in 9/23.  Will have a repeat in 1 year.  Managing vision with glasses.    Seen by atrial fibrillation clinic in August 2023.  She was in sinus rhythm on low-dose amiodarone.  Denies : Chest pain. Dizziness. Leg edema. Nitroglycerin use. Orthopnea. Palpitations. Paroxysmal nocturnal dyspnea.  Syncope.    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 Wyatt Newton 06-20-2021   PAF (paroxysmal atrial fibrillation)  [facility])     Past Surgical History:  Procedure Laterality Date   BIOPSY  04/05/2019   Procedure: BIOPSY;  Surgeon: Carrie Horner, MD;  Location: [facility] ENDOSCOPY;  Service: Endoscopy;;   BREAST LUMPECTOMY  1992   Left breast   CARPAL TUNNEL RELEASE     Left hand 05/2012 and right hand dec 2013   COLONOSCOPY WITH PROPOFOL N/A 04/05/2019   Procedure: COLONOSCOPY WITH PROPOFOL;  Surgeon: Carrie Horner, MD;  Location[facility] ENDOSCOPY;  Service: Endoscopy;  Laterality: N/A;   ESOPHAGOGASTRODUODENOSCOPY (EGD) WITH PROPOFOL N/A 04/05/2019   Procedure: ESOPHAGOGASTRODUODENOSCOPY (EGD) WITH PROPOFOL;  Surgeon: Carrie Horner, MD;  Location: [facility] ENDOSCOPY;  Service: Endoscopy;  Laterality: N/A;   TOTAL KNEE ARTHROPLASTY  07/2009   Left knee   TRIGGER FINGER RELEASE  2015   Left thumb     Current Outpatient Medications  Medication Sig Dispense Refill   ACCU-CHEK AVIVA PLUS test strip 1 each by Other route as needed for other (SUGAR CHECK).      alendronate (FOSAMAX) 70 MG tablet Take 70 mg by mouth once a week.     amiodarone (PACERONE) 200 MG tablet Take 0.5 tablets (100 mg total) by mouth daily. 90 tablet 3   atorvastatin (LIPITOR) 20 MG tablet Take 20  mg by mouth daily at 6 PM.      buPROPion (WELLBUTRIN XL) 300 MG 24 hr tablet Take 300 mg by mouth daily. Take 1 tab daily     Calcium-Phosphorus-Vitamin D (CITRACAL +D3 PO) Take 1 tablet by mouth daily.     diclofenac Sodium (VOLTAREN) 1 % GEL Apply topically daily as needed.     ELIQUIS 5 MG TABS tablet TAKE 1 TABLET BY MOUTH  TWICE DAILY 180 tablet 3   felodipine (PLENDIL) 10 MG 24 hr tablet Take 10 mg daily by mouth.     furosemide (LASIX) 40 MG tablet Take 1 tablet (40 mg total) by mouth daily as needed for fluid or edema (if you gain 3 pounds over night or 5 pounds in a week). 45 tablet 3   HYDROcodone-acetaminophen (NORCO) 10-325 MG per tablet Take 2 tablets by mouth daily. Take as directed     Insulin Degludec (TRESIBA) 100 UNIT/ML SOLN Inject  into the skin. Use depends on blood sugar level.     insulin lispro (HUMALOG) 100 UNIT/ML injection Inject into the skin as needed. Use as needed for blood sugar levels before meals.     levothyroxine (SYNTHROID) 88 MCG tablet Take 88 mcg by mouth daily before breakfast. Alternate with 75 mcg     levothyroxine (SYNTHROID, LEVOTHROID) 75 MCG tablet Take 75 mcg by mouth daily before breakfast. Alternate with 88 mcg tab     losartan (COZAAR) 50 MG tablet Take 0.5 tablets by mouth daily.     Multiple Vitamin (MULTIVITAMIN) tablet Take 1 tablet by mouth daily.     mupirocin ointment (BACTROBAN) 2 % Apply 1 application topically continuous as needed (CUTS).      OZEMPIC, 1 MG/DOSE, 2 MG/1.5ML SOPN Inject 1 mg into the skin once a week.     pantoprazole (PROTONIX) 40 MG tablet Take 40 mg by mouth every other day.      sertraline (ZOLOFT) 100 MG tablet Take 100 mg by mouth daily.     vitamin C (ASCORBIC ACID) 500 MG tablet Take 1 tablet by mouth daily as needed.     No current facility-administered medications for this visit.    Allergies:   Metoprolol tartrate, Neosporin [neomycin-bacitracin zn-polymyx], Norvasc [amlodipine besylate], Other, and Percodan [oxycodone-aspirin]    Social History:  The patient  reports that she has never smoked. She has never used smokeless tobacco. She reports that she does not drink alcohol and does not use drugs.   Family History:  The patient's family history includes Heart attack in her father; Hypertension in her father.    ROS:  Please see the history of present illness.   Otherwise, review of systems are positive for vision problems.   All other systems are reviewed and negative.    PHYSICAL EXAM: VS:  BP 136/60   Ht _0  (1.6 m)   Wt 251 lb (113.9 kg)   SpO2 97%   BMI 44.46 kg/m  , BMI Body mass index is 44.46 kg/m. GEN: Well nourished, well developed, in no acute distress HEENT: normal Neck: no JVD, carotid bruits, or masses Cardiac: bradycardic,  premature beats; no murmurs, rubs, or gallops,no edema  Respiratory:  clear to auscultation bilaterally, normal work of breathing GI: soft, nontender, nondistended, + BS MS: no deformity or atrophy Skin: warm and dry, no rash Neuro:  Strength and sensation are intact Psych: euthymic mood, full affect   EKG:   The ekg ordered today demonstrates junctional rhythm, PACs   Recent  Labs: 05/15/2021: NT-Pro BNP 276 03/05/2022: ALT 25; BUN 10; Creatinine, Ser 0.88; Potassium 4.4; Sodium 139; TSH 2.698   Lipid Panel No results found for: "CHOL", "TRIG", "HDL", "CHOLHDL", "VLDL", "LDLCALC", "LDLDIRECT"   Other studies Reviewed: Additional studies/ records that were reviewed today with results demonstrating: labs reviewed; prior ECG reviewed.   ASSESSMENT AND PLAN:  Atrial fibrillation: Low-dose amiodarone has been effective in helping maintain sinus rhythm.  Now appears to be in a junctional escape rhythm.  No sx of syncope but may need consideration for pacemaker. Will decrease Amio frequency to 4 days a week.  Acquired thrombophilia: Eliquis for stroke prevention.  Watch for bleeding issues.   Hyperlipidemia: Whole food, plant-based diet recommended. Hypertensive heart disease: The current medical regimen is effective;  continue present plan and medications. DM: A1c 6.9 in October 2023.  Followed and managed by her primary care doctor.   Current medicines are reviewed at length with the patient today.  The patient concerns regarding her medicines were addressed.  The following changes have been made:  No change  Labs/ tests ordered today include:  No orders of the defined types were placed in this encounter.   Recommend 150 minutes/week of aerobic exercise Low fat, low carb, high fiber diet recommended  Disposition:   FU in 6 months   Signed, Larae Grooms, MD  04/21/2022 1:36 PM    [facility] [place], [place], [place]  [address] Phone: [phone]; Fax: [phone]

## 2022-04-21 ENCOUNTER — Ambulatory Visit: Payer: Medicare Other | Attending: Interventional Cardiology | Admitting: Interventional Cardiology

## 2022-04-21 ENCOUNTER — Encounter: Payer: Self-pay | Admitting: Interventional Cardiology

## 2022-04-21 VITALS — BP 136/60 | Ht 63.0 in | Wt 251.0 lb

## 2022-04-21 DIAGNOSIS — R601 Generalized edema: Secondary | ICD-10-CM

## 2022-04-21 DIAGNOSIS — E782 Mixed hyperlipidemia: Secondary | ICD-10-CM | POA: Diagnosis not present

## 2022-04-21 DIAGNOSIS — I119 Hypertensive heart disease without heart failure: Secondary | ICD-10-CM

## 2022-04-21 DIAGNOSIS — I4819 Other persistent atrial fibrillation: Secondary | ICD-10-CM | POA: Diagnosis not present

## 2022-04-21 DIAGNOSIS — D6869 Other thrombophilia: Secondary | ICD-10-CM

## 2022-04-21 DIAGNOSIS — E119 Type 2 diabetes mellitus without complications: Secondary | ICD-10-CM | POA: Diagnosis not present

## 2022-04-21 DIAGNOSIS — Z794 Long term (current) use of insulin: Secondary | ICD-10-CM

## 2022-04-21 MED ORDER — AMIODARONE HCL 200 MG PO TABS: ORAL_TABLET | ORAL | 2 refills | Status: DC

## 2022-04-21 NOTE — Patient Instructions (Signed)
Medication Instructions:  Your physician has recommended you make the following change in your medication: Decrease amiodarone to 100 mg 4 days per week.  Take on Sunday, Tuesday, Thursday and Saturday  *If you need a refill on your cardiac medications before your next appointment, please call your pharmacy*   Lab Work: none If you have labs (blood work) drawn today and your tests are completely normal, you will receive your results only by: [place] (if you have MyChart) OR A paper copy in the mail If you have any lab test that is abnormal or we need to change your treatment, we will call you to review the results.   Testing/Procedures: none   Follow-Up: At [facility], you and your health needs are our priority.  As part of our continuing mission to provide you with exceptional heart care, we have created designated Provider Care Teams.  These Care Teams include your primary Cardiologist (physician) and Advanced Practice Providers (APPs -  Physician Assistants and Nurse Practitioners) who all work together to provide you with the care you need, when you need it.  We recommend signing up for the patient portal called "MyChart".  Sign up information is provided on this After Visit Summary.  MyChart is used to connect with patients for Virtual Visits (Telemedicine).  Patients are able to view lab/test results, encounter notes, upcoming appointments, etc.  Non-urgent messages can be sent to your provider as well.   To learn more about what you can do with MyChart, go to [email or web address].    Your next appointment:   6 month(s)  The format for your next appointment:   In Person  Provider:   Larae Grooms, MD     Other Instructions Please schedule new patient appointment with electrophysiology  Important Information About Sugar

## 2022-05-19 ENCOUNTER — Other Ambulatory Visit: Payer: Self-pay | Admitting: Urology

## 2022-05-20 NOTE — Progress Notes (Signed)
Sent message, via epic in basket, requesting orders in epic from surgeon.  

## 2022-05-21 ENCOUNTER — Encounter: Payer: Self-pay | Admitting: Cardiovascular Disease

## 2022-05-21 ENCOUNTER — Telehealth: Payer: Self-pay | Admitting: Pharmacist

## 2022-05-21 ENCOUNTER — Ambulatory Visit: Payer: Medicare Other | Attending: Cardiovascular Disease | Admitting: Cardiovascular Disease

## 2022-05-21 VITALS — BP 118/64 | HR 60 | Ht 63.0 in | Wt 246.6 lb

## 2022-05-21 DIAGNOSIS — I4819 Other persistent atrial fibrillation: Secondary | ICD-10-CM | POA: Diagnosis not present

## 2022-05-21 NOTE — Patient Instructions (Signed)
Medication Instructions:  Your physician has recommended you make the following change in your medication:  1) You will be started on Tikosyn (dofetilide) - see below for further information.   *If you need a refill on your cardiac medications before your next appointment, please call your pharmacy*  Follow-Up: At [facility], you and your health needs are our priority.  As part of our continuing mission to provide you with exceptional heart care, we have created designated Provider Care Teams.  These Care Teams include your primary Cardiologist (physician) and Advanced Practice Providers (APPs -  Physician Assistants and Nurse Practitioners) who all work together to provide you with the care you need, when you need it.   Other Instructions Tikosyn (Dofetilide) Hospital Admission   Prior to day of admission:  Check with drug insurance company for cost of drug to ensure affordability --- Dofetilide 500 mcg twice a day.  GoodRx is an option if insurance copay is unaffordable.    No Benadryl is allowed 3 days prior to admission.   Please ensure no missed doses of your anticoagulation (blood thinner) for 3 weeks prior to admission. If a dose is missed please notify our office immediately.   A pharmacist will review all your medications for potential interactions with Tikosyn. If any medication changes are needed prior to admission we will be in touch with you.   If any new medications are started AFTER your admission date is set with Nurse, adult. Please notify our office immediately so your medication list can be updated and reviewed by our pharmacist again.  On day of admission:  Tikosyn initiation requires a 3 night/4 day hospital stay with constant telemetry monitoring. You will have an EKG after each dose of Tikosyn as well as daily lab draws.   If the drug does not convert you to normal rhythm a cardioversion after the 4th dose of Tikosyn.   Afib Clinic office visit on the  morning of admission is needed for preliminary labs/ekg.   Time of admission is dependent on bed availability in the hospital. In some instances, you will be sent home until bed is available. Rarely admission can be delayed to the following day if hospital census prevents available beds.   You may bring personal belongings/clothing with you to the hospital. Please leave your suitcase in the car until you arrive in admissions.   Questions please call our office at [phone]

## 2022-05-21 NOTE — Telephone Encounter (Addendum)
Medication list reviewed in anticipation of upcoming Tikosyn initiation. Patient is taking furosemide which could alter the potasium and magnesium level. Subtherapeutic electrolyte concentration could lead to Qtc or TdP. So, monitor serum potasium and magnesium more closely when dofetilide is used with loop diuretics.  Also sertraline may enhance the Qtc prolonging effect - alternatives without Qtc prolongation risk are duloxetine or desvenlafaxine.  Amiodarone and dofetilide - consider alternative to this combination as both has Qtc prolonging effect.   Patient is anticoagulated on Eliquis on the appropriate dose. Please ensure that patient has not missed any anticoagulation doses in the 3 weeks prior to Tikosyn initiation.   Patient will need to be counseled to avoid use of Benadryl while on Tikosyn and in the 2-3 days prior to Tikosyn initiation.

## 2022-05-21 NOTE — Addendum Note (Signed)
Addended by: Bernestine Amass on: 05/21/2022 03:01 PM   Modules accepted: Orders

## 2022-05-21 NOTE — Progress Notes (Signed)
Electrophysiology Office Note:    Date:  05/21/2022   ID:  Carrie Newton, DOB 11/05/1945, MRN [id number]  PCP:  Mayra Neer, MD   [facility] [facility] Cardiologist:  Larae Grooms, MD Electrophysiologist:  Melida Quitter, MD     Referring MD: Jettie Booze, MD   Chief complaint: atrial fibrillation  History of Present Illness:    Carrie Newton is a 76 y.o. female with a hx of DM, HTN, atrial fibrillation referred for arrhythmia management.   She was diagnosed with AF some time ago and managed with eliquis for a CHADS2VASC of 5. She had a more severe episode in April and started eliquis. This helped managed the AF, but she had severe fatigue, so her dose was progressively reduced. She was in a junctional rhythm during a follow-up visit in October. The amiodarone was further reduced, but she still remains on 110m.  She has some fatigue with ambulation and dyspnea. She also had palpitations during AF. She does not have chest pain, syncope, orthopnea.     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 SWyatt Portela12-16-2022   PAF (paroxysmal atrial fibrillation) ([facility]     Past Surgical History:  Procedure Laterality Date   BIOPSY  04/05/2019   Procedure: BIOPSY;  Surgeon: GWonda Horner MD;  [facility];  Service: Endoscopy;;   BREAST LUMPECTOMY  1992   Left breast   CARPAL TUNNEL RELEASE     Left hand 05/2012 and right hand dec 2013   COLONOSCOPY WITH PROPOFOL N/A 04/05/2019   Procedure: COLONOSCOPY WITH PROPOFOL;  Surgeon: GWonda Horner MD;  [facility];  Service: Endoscopy;  Laterality: N/A;    ESOPHAGOGASTRODUODENOSCOPY (EGD) WITH PROPOFOL N/A 04/05/2019   Procedure: ESOPHAGOGASTRODUODENOSCOPY (EGD) WITH PROPOFOL;  Surgeon: GWonda Horner MD;  [facility];  Service: Endoscopy;  Laterality: N/A;   TOTAL KNEE ARTHROPLASTY  07/2009   Left knee   TRIGGER FINGER RELEASE  2015   Left thumb    Current Medications: Current Meds  Medication Sig   ACCU-CHEK AVIVA PLUS test strip 1 each by Other route as needed for other (SUGAR CHECK).    alendronate (FOSAMAX) 70 MG tablet Take 70 mg by mouth once a week.   amiodarone (PACERONE) 200 MG tablet Take half tablet by mouth on Sun, Tues, Thurs and Sat   atorvastatin (LIPITOR) 20 MG tablet Take 20 mg by mouth daily at 6 PM.    buPROPion (WELLBUTRIN XL) 300 MG 24 hr tablet Take 300 mg by mouth daily. Take 1 tab daily   Calcium-Phosphorus-Vitamin D (CITRACAL +D3 PO) Take 1 tablet by mouth daily.   diclofenac Sodium (VOLTAREN) 1 % GEL Apply topically daily as needed.   ELIQUIS 5 MG TABS tablet TAKE 1 TABLET BY MOUTH  TWICE DAILY   felodipine (PLENDIL) 10 MG 24 hr tablet Take 10 mg daily by mouth.   furosemide (LASIX) 40 MG tablet Take 1 tablet (40 mg total) by mouth daily as needed for fluid or edema (if you gain 3 pounds over night or 5 pounds in a week).   HYDROcodone-acetaminophen (  NORCO) 10-325 MG per tablet Take 2 tablets by mouth daily. Take as directed   Insulin Degludec (TRESIBA) 100 UNIT/ML SOLN Inject into the skin. Use depends on blood sugar level.   insulin lispro (HUMALOG) 100 UNIT/ML injection Inject into the skin as needed. Use as needed for blood sugar levels before meals.   levothyroxine (SYNTHROID) 88 MCG tablet Take 88 mcg by mouth daily before breakfast. Alternate with 75 mcg   levothyroxine (SYNTHROID, LEVOTHROID) 75 MCG tablet Take 75 mcg by mouth daily before breakfast. Alternate with 88 mcg tab   losartan (COZAAR) 50 MG tablet Take 0.5 tablets by mouth daily.   Multiple Vitamin (MULTIVITAMIN) tablet Take 1 tablet by  mouth daily.   mupirocin ointment (BACTROBAN) 2 % Apply 1 application topically continuous as needed (CUTS).    OZEMPIC, 1 MG/DOSE, 2 MG/1.5ML SOPN Inject 1 mg into the skin once a week.   pantoprazole (PROTONIX) 40 MG tablet Take 40 mg by mouth every other day.    sertraline (ZOLOFT) 100 MG tablet Take 100 mg by mouth daily.   vitamin C (ASCORBIC ACID) 500 MG tablet Take 1 tablet by mouth daily as needed.     Allergies:   Metoprolol tartrate, Neosporin [neomycin-bacitracin zn-polymyx], Norvasc [amlodipine besylate], Other, and Percodan [oxycodone-aspirin]   Social History   Socioeconomic History   Marital status: Divorced    Spouse name: Not on file   Number of children: Not on file   Years of education: Not on file   Highest education level: Not on file  Occupational History   Not on file  Tobacco Use   Smoking status: Never   Smokeless tobacco: Never   Tobacco comments:    Never smoke 03/05/22  Vaping Use   Vaping Use: Never used  Substance and Sexual Activity   Alcohol use: No    Alcohol/week: 0.0 standard drinks of alcohol   Drug use: No   Sexual activity: Not on file  Other Topics Concern   Not on file  Social History Narrative   Not on file   Social Determinants of Health   Financial Resource Strain: Not on file  Food Insecurity: Not on file  Transportation Needs: Not on file  Physical Activity: Not on file  Stress: Not on file  Social Connections: Not on file     Family History: The patient's family history includes Heart attack in her father; Hypertension in her father. There is no history of Stroke.  ROS:   Please see the history of present illness.    All other systems reviewed and are negative.  EKGs/Labs/Other Studies Reviewed Today:     TTE 05/2021 EF 60-65% Moderately dilated LA  EKG:  Last EKG results: sinus rhythm with sinus arrest and junctional escape   Recent Labs: 03/05/2022: ALT 25; BUN 10; Creatinine, Ser 0.88; Potassium 4.4;  Sodium 139; TSH 2.698     Physical Exam:    VS:  BP 118/64   Pulse 60   Ht _0  (1.6 m)   Wt 246 lb 9.6 oz (111.9 kg)   SpO2 98%   BMI 43.68 kg/m     Wt Readings from Last 3 Encounters:  05/21/22 246 lb 9.6 oz (111.9 kg)  04/21/22 251 lb (113.9 kg)  03/05/22 253 lb 6.4 oz (114.9 kg)     GEN:  Well nourished, well developed in no acute distress CARDIAC: RRR, no murmurs, rubs, gallops RESPIRATORY:  Normal work of breathing MUSCULOSKELETAL: no edema    ASSESSMENT &  PLAN:    Paroxysmal atrial fibrillation: symptomatic. Rhythm has been well controlled on amiodarone. However, she has been having bradycardia with sick sinus syndrome. Also, she would like to DC amiodarone due to its side effect profile. I think this is very reasonable. We can try Tikosyn. I explained the risks to her including potential life threatening arrhythmias though this risk is substantially mitigated by close observation with drug initiation. She would like to proceed with Tikosyn load. I do not think she is a good candidate for ablation due to obesity but she is actively losing weight. Sick sinus syndrome: will monitor after discontinuing amiodarone. Tikosyn should not have negative chronotropic effect.        Medication Adjustments/Labs and Tests Ordered: Current medicines are reviewed at length with the patient today.  Concerns regarding medicines are outlined above.  Orders Placed This Encounter  Procedures   EKG 12-Lead   No orders of the defined types were placed in this encounter.    Signed, Melida Quitter, MD  05/21/2022 1:57 PM    [place]

## 2022-05-21 NOTE — Telephone Encounter (Signed)
-----   Message from Juluis Mire, RN sent at 05/21/2022  2:56 PM EST ----- Regarding: FW: Tikosyn admission Pt for tikosyn please review meds thanks stacy  ----- Message ----- From: Bernestine Amass, RN Sent: 05/21/2022   2:30 PM EST To: Juluis Mire, RN Subject: [place] admission                              Per Dr. Myles Gip - Tikosyn admission.   Thank you! Carly

## 2022-05-22 ENCOUNTER — Telehealth: Payer: Self-pay | Admitting: Cardiovascular Disease

## 2022-05-22 NOTE — Telephone Encounter (Signed)
Patient has questions about the medication she will be getting when she has her procedure done. She called her insurance company and has some information for the nurse.

## 2022-05-22 NOTE — Telephone Encounter (Signed)
Patient should discontinue amiodarone per Dr. Myles Gip. I have left the patient a message.

## 2022-05-22 NOTE — Telephone Encounter (Signed)
Spoke with the patient who states that she called her insurance and they cover Tikosyn so she will be able to afford it if it works.   Also advised patient on discontinuing amiodarone. Patient verbalized understanding.

## 2022-05-26 NOTE — Patient Instructions (Addendum)
DUE TO COVID-19 ONLY TWO VISITORS  (aged 75 and older)  ARE ALLOWED TO COME WITH YOU AND STAY IN THE WAITING ROOM ONLY DURING PRE OP AND PROCEDURE.   **NO VISITORS ARE ALLOWED IN THE SHORT STAY AREA OR RECOVERY ROOM!!**  IF YOU WILL BE ADMITTED INTO THE HOSPITAL YOU ARE ALLOWED ONLY FOUR SUPPORT PEOPLE DURING VISITATION HOURS ONLY (7 AM -8PM)   The support person(s) must pass our screening, gel in and out, and wear a mask at all times, including in the patient's room. Patients must also wear a mask when staff or their support person are in the room. Visitors GUEST BADGE MUST BE WORN VISIBLY  One adult visitor may remain with you overnight and MUST be in the room by 8 P.M.     Your procedure is scheduled on: 06/03/22   Report to [facility] Main Entrance    Report to admitting at  7:45 AM   Call this number if you have problems the morning of surgery [phone]   Do not eat food  and Drink:After Midnight.                If you have questions, please contact your surgeon's office.       Oral Hygiene is also important to reduce your risk of infection.                                    Remember - BRUSH YOUR TEETH THE MORNING OF SURGERY WITH YOUR REGULAR TOOTHPASTE   Do NOT smoke after Midnight   Take these medicines the morning of surgery with A SIP OF WATER: Bupropion-Wellbutrin                                                                                                                           Atorvastatin                                                                                                                           Levothyroxine-Synthroid  Pantoprazole -Protonix How to Manage Your Diabetes Before and After Surgery  Why is it important to control my blood sugar before and after surgery? Improving blood sugar levels before and after  surgery helps healing and can limit problems. A way of improving blood sugar control is eating a healthy diet by:  Eating less sugar and carbohydrates  Increasing activity/exercise  Talking with your doctor about reaching your blood sugar goals High blood sugars (greater than 180 mg/dL) can raise your risk of infections and slow your recovery, so you will need to focus on controlling your diabetes during the weeks before surgery. Make sure that the doctor who takes care of your diabetes knows about your planned surgery including the date and location.  How do I manage my blood sugar before surgery? Check your blood sugar at least 4 times a day, starting 2 days before surgery, to make sure that the level is not too high or low. Check your blood sugar the morning of your surgery when you wake up and every 2 hours until you get to the Short Stay unit. If your blood sugar is less than 70 mg/dL, you will need to treat for low blood sugar: Do not take insulin. Treat a low blood sugar (less than 70 mg/dL) with  cup of clear juice (cranberry or apple), 4 glucose tablets, OR glucose gel. Recheck blood sugar in 15 minutes after treatment (to make sure it is greater than 70 mg/dL). If your blood sugar is not greater than 70 mg/dL on recheck, call [phone] for further instructions. Report your blood sugar to the short stay nurse when you get to Short Stay.  If you are admitted to the hospital after surgery: Your blood sugar will be checked by the staff and you will probably be given insulin after surgery (instead of oral diabetes medicines) to make sure you have good blood sugar levels. The goal for blood sugar control after surgery is 80-180 mg/dL.   WHAT DO I DO ABOUT MY DIABETES MEDICATION?  Do not take oral diabetes medicines (pills) the morning of surgery.  THE DAY BEFORE SURGERY, take  the full dose of Tresiba insulin with your breakfast.            Take half the dose at lunch and half the  dose at dinner.  THE MORNING OF SURGERY Take half of the am dose  DO NOT TAKE THE FOLLOWING 7 DAYS PRIOR TO SURGERY: Ozempic, Wegovy, Rybelsus (Semaglutide), Byetta (exenatide), Bydureon (exenatide ER), Victoza, Saxenda (liraglutide), or Trulicity (dulaglutide) Mounjaro (Tirzepatide) Adlyxin (Lixisenatide), Polyethylene Glycol Loxenatide. Hold your Ozempic for 7  days prior to day of surgery(11/22)       Bring CPAP mask and tubing day of surgery.                              You may not have any metal on your body including hair pins, jewelry, and body piercing             Do not wear make-up, lotions, powders, perfumes/cologne, or deodorant  Do not wear nail polish including gel and S&S, artificial/acrylic nails, or any other type of covering on natural nails including finger and toenails. If you have artificial nails, gel coating, etc. that needs to be removed by a nail salon please have this removed prior to surgery or surgery may need to be canceled/ delayed if the surgeon/ anesthesia feels like they are unable  to be safely monitored.   Do not shave  48 hours prior to surgery.     Do not bring valuables to the hospital. [place].   Contacts, dentures or bridgework may not be worn into surgery.   Bring small overnight bag day of surgery.   DO NOT [place]. PHARMACY WILL DISPENSE MEDICATIONS LISTED ON YOUR MEDICATION LIST TO YOU DURING YOUR ADMISSION [place]!    Patients discharged on the day of surgery will not be allowed to drive home.  Someone NEEDS to stay with you for the first 24 hours after anesthesia.   Special Instructions: Bring a copy of your healthcare power of attorney and living will documents  the day of surgery if you haven't scanned them before.              Please read over the following fact sheets you were given: IF YOU HAVE QUESTIONS ABOUT YOUR PRE-OP INSTRUCTIONS  PLEASE CALL [phone]    [facility] - Preparing for Surgery Before surgery, you can play an important role.  Because skin is not sterile, your skin needs to be as free of germs as possible.  You can reduce the number of germs on your skin by washing with CHG (chlorahexidine gluconate) soap before surgery.  CHG is an antiseptic cleaner which kills germs and bonds with the skin to continue killing germs even after washing. Please DO NOT use if you have an allergy to CHG or antibacterial soaps.  If your skin becomes reddened/irritated stop using the CHG and inform your nurse when you arrive at Short Stay. Do not shave (including legs and underarms) for at least 48 hours prior to the first CHG shower.   Please follow these instructions carefully:  1.  Shower with CHG Soap the night before surgery and the  morning of Surgery.  2.  If you choose to wash your hair, wash your hair first as usual with your  normal  shampoo.  3.  After you shampoo, rinse your hair and body thoroughly to remove the  shampoo.                            4.  Use CHG as you would any other liquid soap.  You can apply chg directly  to the skin and wash                       Gently with a scrungie or clean washcloth.  5.  Apply the CHG Soap to your body ONLY FROM THE NECK DOWN.   Do not use on face/ open                           Wound or open sores. Avoid contact with eyes, ears mouth and genitals (private parts).                       Wash face,  Genitals (private parts) with your normal soap.             6.  Wash thoroughly, paying special attention to the area where your surgery  will be performed.  7.  Thoroughly rinse your body with warm water from the neck down.  8.  DO NOT shower/wash with your normal soap after using and rinsing off  the CHG Soap.                9.  Pat yourself dry with a clean towel.            10.  Wear clean pajamas.            11.  Place clean sheets on your bed the night of your first shower  and do not  sleep with pets. Day of Surgery : Do not apply any lotions/deodorants the morning of surgery.  Please wear clean clothes to the hospital/surgery center.  FAILURE TO FOLLOW THESE INSTRUCTIONS MAY RESULT IN THE CANCELLATION OF YOUR SURGERY    ________________________________________________________________________

## 2022-05-27 ENCOUNTER — Encounter (HOSPITAL_COMMUNITY)
Admission: RE | Admit: 2022-05-27 | Discharge: 2022-05-27 | Disposition: A | Discharge status: Home / Self Care | Payer: Medicare Other | Source: Ambulatory Visit | Attending: Urology | Admitting: Urology

## 2022-05-27 ENCOUNTER — Other Ambulatory Visit: Payer: Self-pay

## 2022-05-27 ENCOUNTER — Encounter (HOSPITAL_COMMUNITY): Payer: Self-pay

## 2022-05-27 VITALS — BP 153/73 | HR 61 | Temp 99.1°F | Resp 18 | Ht 63.0 in | Wt 243.0 lb

## 2022-05-27 DIAGNOSIS — Z01818 Encounter for other preprocedural examination: Secondary | ICD-10-CM | POA: Insufficient documentation

## 2022-05-27 DIAGNOSIS — E119 Type 2 diabetes mellitus without complications: Secondary | ICD-10-CM

## 2022-05-27 DIAGNOSIS — I10 Essential (primary) hypertension: Secondary | ICD-10-CM | POA: Diagnosis not present

## 2022-05-27 LAB — CBC
HCT: 37.6 % (ref 36.0–46.0)
Hemoglobin: 11.7 g/dL — ABNORMAL LOW (ref 12.0–15.0)
MCH: 28.4 pg (ref 26.0–34.0)
MCHC: 31.1 g/dL (ref 30.0–36.0)
MCV: 91.3 fL (ref 80.0–100.0)
Platelets: 262 10*3/uL (ref 150–400)
RBC: 4.12 MIL/uL (ref 3.87–5.11)
RDW: 16.6 % — ABNORMAL HIGH (ref 11.5–15.5)
WBC: 7.3 10*3/uL (ref 4.0–10.5)
nRBC: 0 % (ref 0.0–0.2)

## 2022-05-27 LAB — GLUCOSE, CAPILLARY: Glucose-Capillary: 202 mg/dL — ABNORMAL HIGH (ref 70–99)

## 2022-05-27 LAB — BASIC METABOLIC PANEL
Anion gap: 7 (ref 5–15)
BUN: 12 mg/dL (ref 8–23)
CO2: 24 mmol/L (ref 22–32)
Calcium: 8.9 mg/dL (ref 8.9–10.3)
Chloride: 108 mmol/L (ref 98–111)
Creatinine, Ser: 0.85 mg/dL (ref 0.44–1.00)
GFR, Estimated: >60 mL/min (ref >60)
Glucose, Bld: 194 mg/dL — ABNORMAL HIGH (ref 70–99)
Potassium: 4.2 mmol/L (ref 3.5–5.1)
Sodium: 139 mmol/L (ref 135–145)

## 2022-05-27 NOTE — Progress Notes (Addendum)
Anesthesia note:  Bowel prep reminder:  no  PCP - Dr. Serita Grammes Cardiologist -Dr. Lendell Caprice Other-   Chest x-ray - no EKG - 05/21/22-epic Stress Test - no ECHO - 05/19/21-epic Cardiac Cath - no CABG-no Pacemaker/ICD device last checked:na  Sleep Study - yes unable to complete. Home test was negative CPAP -no    CBG at PAT visit-202 just had oatmeal Fasting Blood Sugar at home-80-114 Checks Blood Sugar __BID___ Last dose of Ozempic was 11/18. She will hold it. Blood Thinner:Eliquis/ Dr. Irish Lack Blood Thinner Instructions:Dr. Tresa Moore said for Pt to continue it Aspirin Instructions: Last Dose:  Anesthesia review: Yes /  reason: DM, Card  Patient denies shortness of breath, fever, cough and chest pain at PAT appointment. 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 She reports no SOB with activities. Her temp was 99.1 at the PAT visit. She said it is usually low in the 97 range. It dropped to 99 at the end of the visit but she will monitoer it at home and call Dr. Tresa Moore if it goes higher.   Patient verbalized understanding of instructions that were given to them at the PAT appointment. Patient was also instructed that they will need to review over the PAT instructions again at home before surgery.yes. Pt has double vision but she is getting glasses to fix that after the holiday

## 2022-05-29 LAB — HEMOGLOBIN A1C
Hgb A1c MFr Bld: 6.7 % — ABNORMAL HIGH (ref 4.8–5.6)
Mean Plasma Glucose: 146 mg/dL

## 2022-06-01 ENCOUNTER — Telehealth: Payer: Self-pay | Admitting: Interventional Cardiology

## 2022-06-01 NOTE — Telephone Encounter (Signed)
Patient with diagnosis of afib on Eliquis for anticoagulation.    Procedure: left ureteroscopy and stent placement Date of procedure: 06/03/22  CHA2DS2-VASc Score = 5  This indicates a 7.2% annual risk of stroke. The patient's score is based upon: CHF History: 0 HTN History: 1 Diabetes History: 1 Stroke History: 0 Vascular Disease History: 0 Age Score: 2 Gender Score: 1  CrCl 31m/min using adjusted body weight due to obesity Platelet count 262K  Per office protocol, patient can hold Eliquis for 2 days prior to procedure. Of note, she is pending Tikosyn admission, so she will need to be on uninterrupted anticoagulation for 3 weeks after this procedure before she can be scheduled for Tikosyn admit. Will forward to afib clinic as an FYI regarding brief anticoag hold for upcoming procedure since this will factor in to when she can be admitted for Tikosyn initiation.  **This guidance is not considered finalized until pre-operative APP has relayed final recommendations.**

## 2022-06-01 NOTE — Telephone Encounter (Signed)
OK to hold Eliquis 2 days prior to procedure.

## 2022-06-01 NOTE — Telephone Encounter (Signed)
Patient returned call

## 2022-06-01 NOTE — Telephone Encounter (Signed)
Dr. Irish Lack, can you please weigh in, asking per protocol due to recent visit.  Pt does not appear to have a history of ischemic heart disease or heart failure.

## 2022-06-01 NOTE — Telephone Encounter (Signed)
Dr. Myles Gip You saw this patient on 05/21/22 for Afib and episodes of junctional rhythm. Sounds like you were stopping amiodarone with future plans for tikosyn.   We have received a preop clearance request (06/01/28) for a urological procedure on 06/03/22 (ureteroscopy and stent placement). I attempted to reach the patient with no answer. Per our protocol, I'm reaching out to see if you feel comfortable providing risk evaluation. Eliquis hold was not requested - I will send to pharmD.

## 2022-06-01 NOTE — Progress Notes (Signed)
Anesthesia Chart Review   Case: [id number] Date/Time: 06/03/22 0945   Procedures:      CYSTOSCOPY WITH RETROGRADE PYELOGRAM, URETEROSCOPY AND STENT PLACEMENT (Left) - 29 MINS     HOLMIUM LASER APPLICATION (Left)   Anesthesia type: General   Pre-op diagnosis: LEFT RENAL STONE, HEMATURIA   Location: [facility] / [facility]   Surgeons: Alexis Frock, MD       DISCUSSION:76 y.o. never smoker with h/o HTN, DM II, PAF (on Eliquis), left renal stone with hematuria scheduled for above procedure 06/03/2022 with Dr. Alexis Frock.   VS: BP (!) 153/73   Pulse 61   Temp 37.3 C (Oral)   Resp 18   Ht _0  (1.6 m)   Wt 110.2 kg   SpO2 100%   BMI 43.05 kg/m   PROVIDERS: Mayra Neer, MD is PCP    LABS: Labs reviewed: Acceptable for surgery. (all labs ordered are listed, but only abnormal results are displayed)  Labs Reviewed  HEMOGLOBIN A1C - Abnormal; Notable for the following components:      Result Value   Hgb A1c MFr Bld 6.7 (*)    All other components within normal limits  BASIC METABOLIC PANEL - Abnormal; Notable for the following components:   Glucose, Bld 194 (*)    All other components within normal limits  CBC - Abnormal; Notable for the following components:   Hemoglobin 11.7 (*)    RDW 16.6 (*)    All other components within normal limits  GLUCOSE, CAPILLARY - Abnormal; Notable for the following components:   Glucose-Capillary 202 (*)    All other components within normal limits     IMAGES:   EKG:   CV: Echo 05/19/2021  1. Left ventricular ejection fraction, by estimation, is 60 to 65%. Left  ventricular ejection fraction by 3D volume is 62 %. The left ventricle has  normal function. The left ventricle has no regional wall motion  abnormalities. Left ventricular diastolic   parameters are consistent with Grade I diastolic dysfunction (impaired  relaxation).   2. Right ventricular systolic function is normal. The right ventricular  size is  normal. There is normal pulmonary artery systolic pressure. The  estimated right ventricular systolic pressure is 07.6 mmHg.   3. Left atrial size was moderately dilated.   4. The mitral valve is abnormal. Trivial mitral valve regurgitation.  Moderate mitral annular calcification.   5. The aortic valve is tricuspid. Aortic valve regurgitation is not  visualized.   6. The inferior vena cava is normal in size with greater than 50%  respiratory variability, suggesting right atrial pressure of 3 mmHg.  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 Wyatt Portela 06-20-2021   PAF (paroxysmal atrial fibrillation) ([place])    Vision changes 12/2021   double vision    Past Surgical History:  Procedure Laterality Date   BIOPSY  04/05/2019   Procedure: BIOPSY;  Surgeon: Wonda Horner, MD;  Location: [facility] ENDOSCOPY;  Service: Endoscopy;;   BREAST LUMPECTOMY  07/06/1990   Left breast   CARPAL TUNNEL RELEASE  Left hand 05/2012 and right hand dec 2013   COLONOSCOPY WITH PROPOFOL N/A 04/05/2019   Procedure: COLONOSCOPY WITH PROPOFOL;  Surgeon: Wonda Horner, MD;  Location: [facility] ENDOSCOPY;  Service: Endoscopy;  Laterality: N/A;   ESOPHAGOGASTRODUODENOSCOPY (EGD) WITH PROPOFOL N/A 04/05/2019   Procedure: ESOPHAGOGASTRODUODENOSCOPY (EGD) WITH PROPOFOL;  Surgeon: Wonda Horner, MD;  Location: [facility] ENDOSCOPY;  Service: Endoscopy;  Laterality: N/A;   KNEE ARTHROSCOPY Left 2011   TRIGGER FINGER RELEASE  07/06/2013   Left thumb    MEDICATIONS:  ACCU-CHEK AVIVA PLUS test strip   alendronate (FOSAMAX) 70 MG tablet   atorvastatin (LIPITOR) 20 MG  tablet   buPROPion (WELLBUTRIN XL) 300 MG 24 hr tablet   Calcium-Phosphorus-Vitamin D (CITRACAL +D3 PO)   diclofenac Sodium (VOLTAREN) 1 % GEL   ELIQUIS 5 MG TABS tablet   felodipine (PLENDIL) 10 MG 24 hr tablet   furosemide (LASIX) 40 MG tablet   HYDROcodone-acetaminophen (NORCO) 10-325 MG per tablet   Insulin Degludec (TRESIBA) 100 UNIT/ML SOLN   insulin lispro (HUMALOG) 100 UNIT/ML injection   levothyroxine (SYNTHROID) 88 MCG tablet   levothyroxine (SYNTHROID, LEVOTHROID) 75 MCG tablet   losartan (COZAAR) 50 MG tablet   Multiple Vitamin (MULTIVITAMIN) tablet   mupirocin ointment (BACTROBAN) 2 %   pantoprazole (PROTONIX) 40 MG tablet   Semaglutide, 2 MG/DOSE, (OZEMPIC, 2 MG/DOSE,) 8 MG/3ML SOPN   sertraline (ZOLOFT) 100 MG tablet   vitamin C (ASCORBIC ACID) 500 MG tablet   No current facility-administered medications for this encounter.

## 2022-06-01 NOTE — Telephone Encounter (Signed)
   Pre-operative Risk Assessment    Patient Name: Carrie Newton  DOB: [birth year] MRN: [id number]      Request for Surgical Clearance    Procedure:   Left Ureteroscopy and Stent Placement    Date of Surgery:  Clearance 06/03/22                                 Surgeon:  Dr. Rip Harbour Group or Practice Name:  [facility]  Phone number:  [phone] Fax number:  [phone]   Type of Clearance Requested:   - Medical    Type of Anesthesia:  General    Additional requests/questions:    Dorthey Sawyer   06/01/2022, 1:03 PM

## 2022-06-01 NOTE — Telephone Encounter (Signed)
Per requesting office the pt will need to hold Eliquis.

## 2022-06-02 NOTE — Telephone Encounter (Signed)
   Name: Carrie Newton  DOB: 04/06/1946  MRN: [id number]   Primary Cardiologist: Larae Grooms, MD  Chart reviewed as part of pre-operative protocol coverage. Patient was contacted 06/02/2022 in reference to pre-operative risk assessment for pending surgery as outlined below.  Carrie Newton was last seen on 05/21/22 by Dr. Myles Gip.  Since that day, Carrie Newton has done well. She remains active and reports activity equivalent to more than 4.0 METS (house chores, grocery store) without angina. She does not have a history of ischemic heart disease.   Therefore, based on ACC/AHA guidelines, the patient would be at acceptable risk for the planned procedure without further cardiovascular testing.   The patient was advised that if she develops new symptoms prior to surgery to contact our office to arrange for a follow-up visit, and she verbalized understanding.  Per our pharmD: Procedure: left ureteroscopy and stent placement Date of procedure: 06/03/22   CHA2DS2-VASc Score = 5  This indicates a 7.2% annual risk of stroke. The patient's score is based upon: CHF History: 0 HTN History: 1 Diabetes History: 1 Stroke History: 0 Vascular Disease History: 0 Age Score: 2 Gender Score: 1   CrCl 44m/min using adjusted body weight due to obesity Platelet count 262K   Per office protocol, patient can hold Eliquis for 2 days prior to procedure. Of note, she is pending Tikosyn admission, so she will need to be on uninterrupted anticoagulation for 3 weeks after this procedure before she can be scheduled for Tikosyn admit. Will forward to afib clinic as an FYI regarding brief anticoag hold for upcoming procedure since this will factor in to when she can be admitted for Tikosyn initiation.  I will route this recommendation to the requesting party via Epic fax function and remove from pre-op pool. Please call with questions.  ATami LinDuke, PA 06/02/2022, 11:27 AM

## 2022-06-02 NOTE — Anesthesia Preprocedure Evaluation (Signed)
Anesthesia Evaluation    Reviewed: Allergy & Precautions, Patient's Chart, lab work & pertinent test results  History of Anesthesia Complications (+) PONV and history of anesthetic complications  Airway Mallampati: I  TM Distance: >3 FB Neck ROM: Full    Dental  (+) Dental Advisory Given, Teeth Intact   Pulmonary   Borderline OSA    Pulmonary exam normal        Cardiovascular hypertension, Pt. on medications Normal cardiovascular exam+ dysrhythmias Atrial Fibrillation      Neuro/Psych  Ocular stroke Meningioma    negative psych ROS   GI/Hepatic Neg liver ROS,GERD  Controlled and Medicated,,  Endo/Other  diabetes, Type 2, Insulin Dependent  Morbid obesity  Renal/GU negative Renal ROS     Musculoskeletal negative musculoskeletal ROS (+)    Abdominal   Peds  Hematology  (+) Blood dyscrasia, anemia  On eliquis    Anesthesia Other Findings On GLP-1 agonist   Reproductive/Obstetrics                             Anesthesia Physical Anesthesia Plan  ASA: 3  Anesthesia Plan: General   Post-op Pain Management: Tylenol PO (pre-op)*   Induction: Intravenous  PONV Risk Score and Plan: 4 or greater and Treatment may vary due to age or medical condition, Ondansetron, Dexamethasone and Propofol infusion  Airway Management Planned: LMA  Additional Equipment: None  Intra-op Plan:   Post-operative Plan: Extubation in OR  Informed Consent: I have reviewed the patients History and Physical, chart, labs and discussed the procedure including the risks, benefits and alternatives for the proposed anesthesia with the patient or authorized representative who has indicated his/her understanding and acceptance.     Dental advisory given  Plan Discussed with: CRNA and Anesthesiologist  Anesthesia Plan Comments:        Anesthesia Quick Evaluation

## 2022-06-03 ENCOUNTER — Encounter (HOSPITAL_COMMUNITY)
Admission: RE | Disposition: A | Discharge status: Home / Self Care | Payer: Self-pay | Source: Home / Self Care | Attending: Urology

## 2022-06-03 ENCOUNTER — Ambulatory Visit (HOSPITAL_COMMUNITY): Payer: Medicare Other

## 2022-06-03 ENCOUNTER — Ambulatory Visit (HOSPITAL_COMMUNITY)
Admission: RE | Admit: 2022-06-03 | Discharge: 2022-06-03 | Disposition: A | Discharge status: Home / Self Care | Payer: Medicare Other | Attending: Urology | Admitting: Urology

## 2022-06-03 ENCOUNTER — Ambulatory Visit (HOSPITAL_BASED_OUTPATIENT_CLINIC_OR_DEPARTMENT_OTHER): Payer: Medicare Other | Admitting: Anesthesiology

## 2022-06-03 ENCOUNTER — Encounter (HOSPITAL_COMMUNITY): Payer: Self-pay | Admitting: Urology

## 2022-06-03 ENCOUNTER — Ambulatory Visit (HOSPITAL_COMMUNITY): Payer: Medicare Other | Admitting: Physician Assistant

## 2022-06-03 DIAGNOSIS — Z794 Long term (current) use of insulin: Secondary | ICD-10-CM | POA: Diagnosis not present

## 2022-06-03 DIAGNOSIS — E119 Type 2 diabetes mellitus without complications: Secondary | ICD-10-CM | POA: Insufficient documentation

## 2022-06-03 DIAGNOSIS — I480 Paroxysmal atrial fibrillation: Secondary | ICD-10-CM | POA: Insufficient documentation

## 2022-06-03 DIAGNOSIS — Z6841 Body Mass Index (BMI) 40.0 and over, adult: Secondary | ICD-10-CM | POA: Diagnosis not present

## 2022-06-03 DIAGNOSIS — N200 Calculus of kidney: Secondary | ICD-10-CM

## 2022-06-03 DIAGNOSIS — I10 Essential (primary) hypertension: Secondary | ICD-10-CM | POA: Diagnosis not present

## 2022-06-03 DIAGNOSIS — Z7901 Long term (current) use of anticoagulants: Secondary | ICD-10-CM | POA: Insufficient documentation

## 2022-06-03 DIAGNOSIS — I4891 Unspecified atrial fibrillation: Secondary | ICD-10-CM

## 2022-06-03 DIAGNOSIS — K219 Gastro-esophageal reflux disease without esophagitis: Secondary | ICD-10-CM | POA: Insufficient documentation

## 2022-06-03 DIAGNOSIS — R319 Hematuria, unspecified: Secondary | ICD-10-CM | POA: Diagnosis not present

## 2022-06-03 HISTORY — PX: HOLMIUM LASER APPLICATION: SHX5852

## 2022-06-03 HISTORY — PX: CYSTOSCOPY WITH RETROGRADE PYELOGRAM, URETEROSCOPY AND STENT PLACEMENT: SHX5789

## 2022-06-03 LAB — GLUCOSE, CAPILLARY
Glucose-Capillary: 113 mg/dL — ABNORMAL HIGH (ref 70–99)
Glucose-Capillary: 130 mg/dL — ABNORMAL HIGH (ref 70–99)

## 2022-06-03 SURGERY — CYSTOURETEROSCOPY, WITH RETROGRADE PYELOGRAM AND STENT INSERTION
Anesthesia: General | Laterality: Left

## 2022-06-03 MED ORDER — EPHEDRINE SULFATE-NACL 50-0.9 MG/10ML-% IV SOSY
PREFILLED_SYRINGE | INTRAVENOUS | Status: DC | PRN
  Administered 2022-06-03: 5 mg via INTRAVENOUS

## 2022-06-03 MED ORDER — ONDANSETRON HCL 4 MG/2ML IJ SOLN: 4.0000 mg | Freq: Once | INTRAMUSCULAR | Status: DC | PRN

## 2022-06-03 MED ORDER — 0.9 % SODIUM CHLORIDE (POUR BTL) OPTIME
TOPICAL | Status: DC | PRN
  Administered 2022-06-03: 1000 mL

## 2022-06-03 MED ORDER — ONDANSETRON HCL 4 MG/2ML IJ SOLN
INTRAMUSCULAR | Status: DC | PRN
  Administered 2022-06-03: 4 mg via INTRAVENOUS

## 2022-06-03 MED ORDER — LIDOCAINE HCL (PF) 2 % IJ SOLN
INTRAMUSCULAR | Status: AC
  Filled 2022-06-03: qty 5

## 2022-06-03 MED ORDER — DEXAMETHASONE SODIUM PHOSPHATE 10 MG/ML IJ SOLN
INTRAMUSCULAR | Status: AC
  Filled 2022-06-03: qty 1

## 2022-06-03 MED ORDER — ORAL CARE MOUTH RINSE: 15.0000 mL | Freq: Once | OROMUCOSAL | Status: AC

## 2022-06-03 MED ORDER — ACETAMINOPHEN 500 MG PO TABS
1000.0000 mg | ORAL_TABLET | Freq: Once | ORAL | Status: AC
  Administered 2022-06-03: 1000 mg via ORAL
  Filled 2022-06-03: qty 2

## 2022-06-03 MED ORDER — FENTANYL CITRATE (PF) 100 MCG/2ML IJ SOLN
INTRAMUSCULAR | Status: AC
  Filled 2022-06-03: qty 2

## 2022-06-03 MED ORDER — GENTAMICIN SULFATE 40 MG/ML IJ SOLN
5.0000 mg/kg | INTRAVENOUS | Status: AC
  Administered 2022-06-03: 380 mg via INTRAVENOUS
  Filled 2022-06-03: qty 9.5

## 2022-06-03 MED ORDER — PROPOFOL 10 MG/ML IV BOLUS
INTRAVENOUS | Status: AC
  Filled 2022-06-03: qty 20

## 2022-06-03 MED ORDER — DEXAMETHASONE SODIUM PHOSPHATE 10 MG/ML IJ SOLN
INTRAMUSCULAR | Status: DC | PRN
  Administered 2022-06-03: 10 mg via INTRAVENOUS

## 2022-06-03 MED ORDER — IOHEXOL 300 MG/ML  SOLN
INTRAMUSCULAR | Status: DC | PRN
  Administered 2022-06-03: 20 mL via URETHRAL

## 2022-06-03 MED ORDER — PROPOFOL 10 MG/ML IV BOLUS
INTRAVENOUS | Status: DC | PRN
  Administered 2022-06-03: 140 mg via INTRAVENOUS
  Administered 2022-06-03: 60 mg via INTRAVENOUS

## 2022-06-03 MED ORDER — LIDOCAINE 2% (20 MG/ML) 5 ML SYRINGE
INTRAMUSCULAR | Status: DC | PRN
  Administered 2022-06-03: 40 mg via INTRAVENOUS
  Administered 2022-06-03: 20 mg via INTRAVENOUS

## 2022-06-03 MED ORDER — SULFAMETHOXAZOLE-TRIMETHOPRIM 800-160 MG PO TABS: 1.0000 | ORAL_TABLET | Freq: Two times a day (BID) | ORAL | 0 refills | Status: DC

## 2022-06-03 MED ORDER — FENTANYL CITRATE (PF) 100 MCG/2ML IJ SOLN
INTRAMUSCULAR | Status: DC | PRN
  Administered 2022-06-03 (×4): 25 ug via INTRAVENOUS

## 2022-06-03 MED ORDER — HYDROCODONE-ACETAMINOPHEN 10-325 MG PO TABS: 0.5000 | ORAL_TABLET | Freq: Four times a day (QID) | ORAL | 0 refills | Status: DC | PRN

## 2022-06-03 MED ORDER — LACTATED RINGERS IV SOLN: INTRAVENOUS | Status: DC

## 2022-06-03 MED ORDER — EPHEDRINE 5 MG/ML INJ
INTRAVENOUS | Status: AC
  Filled 2022-06-03: qty 5

## 2022-06-03 MED ORDER — FENTANYL CITRATE PF 50 MCG/ML IJ SOSY: 25.0000 ug | PREFILLED_SYRINGE | INTRAMUSCULAR | Status: DC | PRN

## 2022-06-03 MED ORDER — ONDANSETRON HCL 4 MG/2ML IJ SOLN
INTRAMUSCULAR | Status: AC
  Filled 2022-06-03: qty 2

## 2022-06-03 MED ORDER — SODIUM CHLORIDE 0.9 % IR SOLN
Status: DC | PRN
  Administered 2022-06-03: 3000 mL

## 2022-06-03 MED ORDER — CHLORHEXIDINE GLUCONATE 0.12 % MT SOLN
15.0000 mL | Freq: Once | OROMUCOSAL | Status: AC
  Administered 2022-06-03: 15 mL via OROMUCOSAL

## 2022-06-03 SURGICAL SUPPLY — 27 items
BAG URO CATCHER STRL LF (MISCELLANEOUS) ×2 IMPLANT
BASKET LASER NITINOL 1.9FR (BASKET) IMPLANT
BSKT STON RTRVL 120 1.9FR (BASKET)
CATH URETL OPEN END 6FR 70 (CATHETERS) ×2 IMPLANT
CLOTH BEACON ORANGE TIMEOUT ST (SAFETY) ×2 IMPLANT
EXTRACTOR STONE 1.7FRX115CM (UROLOGICAL SUPPLIES) IMPLANT
GLOVE SURG LX STRL 7.5 STRW (GLOVE) ×2 IMPLANT
GOWN SRG XL LVL 4 BRTHBL STRL (GOWNS) ×2 IMPLANT
GOWN STRL NON-REIN XL LVL4 (GOWNS) ×1
GOWN STRL REUS W/ TWL XL LVL3 (GOWN DISPOSABLE) ×2 IMPLANT
GOWN STRL REUS W/TWL XL LVL3 (GOWN DISPOSABLE) ×1
GUIDEWIRE ANG ZIPWIRE 038X150 (WIRE) ×2 IMPLANT
GUIDEWIRE STR DUAL SENSOR (WIRE) ×2 IMPLANT
KIT TURNOVER KIT A (KITS) IMPLANT
LASER FIB FLEXIVA PULSE ID 365 (Laser) IMPLANT
LASER FIB FLEXIVA PULSE ID 550 (Laser) IMPLANT
LASER FIB FLEXIVA PULSE ID 910 (Laser) IMPLANT
MANIFOLD NEPTUNE II (INSTRUMENTS) ×2 IMPLANT
PACK CYSTO (CUSTOM PROCEDURE TRAY) ×2 IMPLANT
SHEATH NAVIGATOR HD 11/13X28 (SHEATH) IMPLANT
SHEATH NAVIGATOR HD 11/13X36 (SHEATH) IMPLANT
STENT POLARIS 5FRX24 (STENTS) IMPLANT
TRACTIP FLEXIVA PULS ID 200XHI (Laser) IMPLANT
TRACTIP FLEXIVA PULSE ID 200 (Laser) ×1
TUBE FEEDING 8FR 16IN STR KANG (MISCELLANEOUS) ×2 IMPLANT
TUBING CONNECTING 10 (TUBING) ×2 IMPLANT
TUBING UROLOGY SET (TUBING) ×2 IMPLANT

## 2022-06-03 NOTE — Anesthesia Procedure Notes (Signed)
Procedure Name: LMA Insertion Date/Time: 06/03/2022 10:31 AM  Performed by: Sharlette Dense, CRNAPatient Re-evaluated:Patient Re-evaluated prior to induction Oxygen Delivery Method: Circle system utilized Preoxygenation: Pre-oxygenation with 100% oxygen Induction Type: IV induction LMA: LMA with gastric port inserted LMA Size: 4.0 Number of attempts: 1 Placement Confirmation: positive ETCO2 and breath sounds checked- equal and bilateral Tube secured with: Tape Dental Injury: Teeth and Oropharynx as per pre-operative assessment

## 2022-06-03 NOTE — Discharge Instructions (Signed)
1 - You may have urinary urgency (bladder spasms) and bloody urine on / off with stent in place. This is normal. ° °2 - Call MD or go to ER for fever >102, severe pain / nausea / vomiting not relieved by medications, or acute change in medical status ° °

## 2022-06-03 NOTE — Op Note (Signed)
NAMENELDA, Carrie Newton MEDICAL RECORD NO: [id number] ACCOUNT NO: [id number] DATE OF BIRTH: 1946/05/26 [facility] LOCATION: [facility] PHYSICIAN: Alexis Frock, MD  Operative Report   DATE OF PROCEDURE: 06/03/2022  PREOPERATIVE DIAGNOSIS:  Increasing size left renal stone with hematuria.  POSTOPERATIVE DIAGNOSIS:  Increasing size left renal stone with hematuria.  PROCEDURE PERFORMED:   1.  Cystoscopy, left retrograde pyelogram interpretation. 2.  Left ureteroscopy with laser lithotripsy, first stage. 3.  Left ureteral stent placement.  ESTIMATED BLOOD LOSS:  Nil.  COMPLICATIONS:  None.  SPECIMEN:  None.  FINDINGS:   1.  Obstructing approximately 2 cm left UPJ stone. 2.  Very friable intrarenal mucosa making visualization quite poor. 3.  Estimated ablation, dusting of approximately 80% of left renal stone today. 4.  Successful placement of left ureteral stent, proximal end in the renal pelvis, distal end in urinary bladder.  INDICATIONS:  The patient is a 76 year old lady with significant comorbidities including morbid obesity.  She is on chronic anticoagulation as well.  She had a known relatively small left renal stone as a source of hematuria that has been followed on  sequential imaging for several years.  Unfortunately, the stone is increasing in size and now most recent imaging greater than 1.5 cm.  Given the increasing size I am quite concerned about eventual significant obstruction.  Options were discussed  including recommended path of preemptive left ureteroscopy with goal of stone free to prevent obstruction of her left kidney and prevent progression to stone so large that percutaneous approach would be necessary.  She wished to proceed.  Informed  consent was obtained and placed in medical record.  She has been on preoperative antibiotics for known urinary colonization.  PROCEDURE IN DETAIL:  The patient being identified and verified, procedure being left ureteroscopic  stone manipulation was confirmed.  Procedure timeout was performed.  Intravenous antibiotics were administered.  General LMA anesthesia was induced.  The  patient was placed into a low lithotomy position.  Sterile field was created, prepped and draped the patient's vagina, introitus, and proximal thighs using iodine.  Cystourethroscopy was performed using 21-French rigid cystoscope with offset lens.   Inspection of the urinary bladder revealed no diverticula, calcifications, papillary lesions.  The left ureteral orifice was cannulated with a 6-French Foley catheter and left retrograde pyelogram was obtained.  Left retrograde pyelogram demonstrated single left ureter, single system left kidney.  There was a large filling defect in the area of the UPJ with some mild hydronephrosis consistent with known stone and this did already appeared to be somewhat  obstructing.  A 0.038 ZIPwire was advanced to the level of the mid pole, set aside as a safety wire.  An 8-French feeding tube placed in the urinary bladder for pressure release and semirigid ureteroscopy is performed of the distal orifice left ureter  alongside as a separate sensor working wire.  No mucosal abnormalities were found.  Semirigid scope was exchanged for a short length ureteral access sheath to the level of proximal ureter using continuous fluoroscopic guidance, flexible digital  ureteroscopy was performed of the proximal ureter and systematic inspection of the left kidney. As anticipated, there was a fairly large stone in the renal pelvis.  It was actually somewhat large anticipated estimated to be approximately 2 cm or so, this  clearly has been obstructing in the mucosa surrounding this and was quite friable.  The stone had many sharp faceted edges and therefore there was significant diffuse mucosal oozing, which made visualization suboptimal,  but acceptable. Stone was much  too large for simple basketing.  Holmium laser energy applied to  stone using escalating settings up to 0.3 joules and 30 Hz.  Using a dusting technique very careful dusting was performed of the stone to the point where it was estimated at 80% of the  stone had been ablated and the remaining fragments appeared quite small estimated to be less than 2 mm.  However, given the significant amount of mucosal friability and oozing, visualization was not acceptable enough to verify satisfactory fragmentation  to a level that I felt comfortable completely finishing her treatment course.  It was clearly felt that the most prudent means of management would be placement of a stent today and a second stage procedure in approximately 3 weeks or so to allow mucosal  inflammation to improve and small fragments to antegrade passage of a stent in place and verify achieving stone free status.  As such, we achieved the goals of the procedure today and a new 5 x 24 Polaris type stent was carefully placed using  fluoroscopic guidance.  Good proximal and distal planes were noted.  Procedure was terminated.  The patient tolerated procedure well, no immediate perioperative complications.  The patient was taken postanesthesia care in stable condition with plan for  discharge home.  We will reach out to schedule her second stage procedure purposely in approximately 3-4 week window.   PUS D: 06/03/2022 11:52:57 am T: 06/03/2022 7:30:00 pm  JOB: [id number]/ [id number]

## 2022-06-03 NOTE — Brief Op Note (Signed)
06/03/2022  11:47 AM  PATIENT:  Carrie Newton  76 y.o. female  PRE-OPERATIVE DIAGNOSIS:  LEFT RENAL STONE, HEMATURIA  POST-OPERATIVE DIAGNOSIS:  LEFT RENAL STONE, HEMATURIA  PROCEDURE:  Procedure(s) with comments: CYSTOSCOPY WITH RETROGRADE PYELOGRAM, URETEROSCOPY AND STENT PLACEMENT FIRST STAGE (Left) - 90 MINS HOLMIUM LASER APPLICATION (Left)  SURGEON:  Surgeon(s) and Role:    Alexis Frock, MD - Primary  PHYSICIAN ASSISTANT:   ASSISTANTS: none   ANESTHESIA:   general  EBL:  minimal   BLOOD ADMINISTERED:none  DRAINS: none   LOCAL MEDICATIONS USED:  NONE  SPECIMEN:  No Specimen  DISPOSITION OF SPECIMEN:  N/A  COUNTS:  YES  TOURNIQUET:  * No tourniquets in log *  DICTATION: .Other Dictation: Dictation Number [id number]  PLAN OF CARE: Discharge to home after PACU  PATIENT DISPOSITION:  PACU - hemodynamically stable.   Delay start of Pharmacological VTE agent (>24hrs) due to surgical blood loss or risk of bleeding: no

## 2022-06-03 NOTE — Anesthesia Postprocedure Evaluation (Signed)
Anesthesia Post Note  Patient: Carrie Newton  Procedure(s) Performed: CYSTOSCOPY WITH RETROGRADE PYELOGRAM, URETEROSCOPY AND STENT PLACEMENT FIRST STAGE (Left) HOLMIUM LASER APPLICATION (Left)     Patient location during evaluation: PACU Anesthesia Type: General Level of consciousness: awake and alert Pain management: pain level controlled Vital Signs Assessment: post-procedure vital signs reviewed and stable Respiratory status: spontaneous breathing, nonlabored ventilation and respiratory function stable Cardiovascular status: stable and blood pressure returned to baseline Anesthetic complications: no   No notable events documented.  Last Vitals:  Vitals:   06/03/22 1215 06/03/22 1230  BP: 127/70 (!) 152/74  Pulse: (!) 57 (!) 54  Resp: 15 19  Temp: 36.7 C 36.7 C  SpO2: 97% 98%    Last Pain:  Vitals:   06/03/22 1215  TempSrc:   PainSc: 0-No pain                 Audry Pili

## 2022-06-03 NOTE — H&P (Signed)
Carrie Newton is an 76 y.o. female.    Chief Complaint: Pre-OP LEFT Ureteroscopic Stone Manipulation  HPI:   1 - Gross Hematuria, Irritative Voiding - on / off gross hematurai x few mos 2021. UCX negative. She is on eliquus for AFib. Non smoker. NO chronic solvent exposure. Hematuria CT 12/2019 with small left renal stone, no upper tract masses.   2 - Left Renal Stone - incidetnal 80m LLP stone w/o hydro on hematuria CT 12/2019. No h/o colic.   86/9485- KUB / Renal [place]- 179mLLP stone, Recent Cr 1.3  05/2022 - KUB, Renal [place]1643[place] stone.   PMH sig for morbid obesity, AFib/Eliquus (follows J. Varanassi), IDDM2 (A1c 7s), benign vag-hyst, ortho surery. 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 No interval fevers. Cards clearance on file. Most recent UCX non-clonal.    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 ScoWyatt Portela-16-2022   PAF (paroxysmal atrial fibrillation) ([place]  Vision changes 12/2021   double vision    Past Surgical History:  Procedure Laterality Date   BIOPSY  04/05/2019   Procedure: BIOPSY;  Surgeon: GanWonda HornerD;  [facility];  Service: Endoscopy;;   BREAST LUMPECTOMY  07/06/1990   Left breast   CARPAL TUNNEL RELEASE     Left hand 05/2012 and right hand dec 2013   COLONOSCOPY WITH PROPOFOL N/A 04/05/2019   Procedure: COLONOSCOPY WITH PROPOFOL;  Surgeon: GanWonda HornerD;  [facility];  Service: Endoscopy;   Laterality: N/A;   ESOPHAGOGASTRODUODENOSCOPY (EGD) WITH PROPOFOL N/A 04/05/2019   Procedure: ESOPHAGOGASTRODUODENOSCOPY (EGD) WITH PROPOFOL;  Surgeon: GanWonda HornerD;  [facility];  Service: Endoscopy;  Laterality: N/A;   KNEE ARTHROSCOPY Left 2011   TRIGGER FINGER RELEASE  07/06/2013   Left thumb    Family History  Problem Relation Age of Onset   Hypertension Father    Heart attack Father    Stroke Neg Hx    Social History:  reports that she has never smoked. She has never used smokeless tobacco. She reports that she does not drink alcohol and does not use drugs.  Allergies:  Allergies  Allergen Reactions   Metoprolol Tartrate Other (See Comments)    Junctional Bradycardia    Neosporin [Neomycin-Bacitracin Zn-Polymyx] Rash   Norvasc [Amlodipine Besylate] Rash   Percodan [Oxycodone-Aspirin] Other (See Comments)    Tingling     No medications prior to admission.    No results found for this or any previous visit (from the past 48 hour(s)). No results found.  Review of Systems  Constitutional:  Negative for chills and fatigue.  Genitourinary:  Positive for hematuria.  All other systems reviewed and are negative.   There were no vitals taken for this visit. Physical Exam Vitals reviewed.  HENT:     Head: Normocephalic.     Nose: Nose normal.  Cardiovascular:  Rate and Rhythm: Normal rate.  Pulmonary:     Effort: Pulmonary effort is normal.  Abdominal:     Comments: Stable large truncal obesity  Genitourinary:    Comments: No CVAT at present Musculoskeletal:        General: Normal range of motion.     Cervical back: Normal range of motion.  Skin:    General: Skin is warm.  Neurological:     General: No focal deficit present.     Mental Status: She is alert.      Assessment/Plan  Proceed as planned with LEFT ureteroscopic stone manipulation. RIsks,  benefits, alternatives, expected peri-op course discussed previously and reiterated  today.   Alexis Frock, MD 06/03/2022, 6:43 AM

## 2022-06-03 NOTE — Transfer of Care (Signed)
Immediate Anesthesia Transfer of Care Note  Patient: Carrie Newton  Procedure(s) Performed: CYSTOSCOPY WITH RETROGRADE PYELOGRAM, URETEROSCOPY AND STENT PLACEMENT FIRST STAGE (Left) HOLMIUM LASER APPLICATION (Left)  Patient Location: PACU  Anesthesia Type:General  Level of Consciousness: awake and alert   Airway & Oxygen Therapy: Patient Spontanous Breathing and Patient connected to face mask oxygen  Post-op Assessment: Report given to RN and Post -op Vital signs reviewed and stable  Post vital signs: Reviewed and stable  Last Vitals:  Vitals Value Taken Time  BP    Temp    Pulse 57 06/03/22 1158  Resp 20 06/03/22 1158  SpO2 100 % 06/03/22 1158  Vitals shown include unvalidated device data.  Last Pain:  Vitals:   06/03/22 0831  TempSrc: Oral  PainSc: 0-No pain         Complications: No notable events documented.

## 2022-06-04 ENCOUNTER — Encounter (HOSPITAL_COMMUNITY): Payer: Self-pay | Admitting: Urology

## 2022-06-09 ENCOUNTER — Other Ambulatory Visit: Payer: Self-pay | Admitting: Urology

## 2022-06-17 NOTE — Progress Notes (Addendum)
For Short Stay: [place] appointment date: N/A  Bowel Prep reminder: N/A   For Anesthesia: PCP - Mayra Neer, MD  Cardiologist - Jettie Booze, MD   Chest x-ray - N/A EKG - 05/21/22 in [facility] Stress Test - N/A ECHO - 05/19/2021 [facility] Cardiac Cath - N/A Pacemaker/ICD device last checked:N/A Pacemaker orders received:N/A Device Rep notified:N/A  Spinal Cord Stimulator:  N/A  Sleep Study - Yes CPAP - N/A  Fasting Blood Sugar - 80-114  Checks Blood Sugar __2___ times a day Date and result of last Hgb A1c- 05/27/22 [facility]  Last dose of GLP1 agonist- N/A GLP1 instructions: N/A  Last dose of SGLT-2 inhibitors- N/A SGLT-2 instructions:N/A  Blood Thinner Instructions:  Patient to remain on all anticoagulants per Dr. Tresa Moore. Aspirin Instructions:N/A Last Dose:N/A  Activity level: activities of daily living without stopping and without chest pain and/or shortness of breath    Anesthesia review: N/A  Patient denies shortness of breath, fever, cough and chest pain at PAT appointment   Patient verbalized understanding of instructions reviewed via telephone.

## 2022-06-18 NOTE — Progress Notes (Signed)
Attempted to obtain medical history via telephone, unable to reach at this time. HIPAA compliant voicemail message left requesting return call to pre surgical testing department. 

## 2022-06-19 ENCOUNTER — Encounter (HOSPITAL_COMMUNITY): Payer: Self-pay | Admitting: Urology

## 2022-06-19 ENCOUNTER — Other Ambulatory Visit: Payer: Self-pay

## 2022-06-23 MED ORDER — GENTAMICIN SULFATE 40 MG/ML IJ SOLN
5.0000 mg/kg | INTRAVENOUS | Status: AC
  Administered 2022-06-24: 380 mg via INTRAVENOUS
  Filled 2022-06-23: qty 9.5

## 2022-06-23 NOTE — Anesthesia Preprocedure Evaluation (Signed)
Anesthesia Evaluation  Patient identified by MRN, date of birth, ID band Patient awake    Reviewed: Allergy & Precautions, NPO status , Patient's Chart, lab work & pertinent test results  History of Anesthesia Complications (+) PONV and history of anesthetic complications  Airway Mallampati: I  TM Distance: >3 FB Neck ROM: Full    Dental no notable dental hx. (+) Teeth Intact, Dental Advisory Given   Pulmonary    Pulmonary exam normal breath sounds clear to auscultation       Cardiovascular hypertension, Pt. on medications Normal cardiovascular exam+ dysrhythmias (eliquis) Atrial Fibrillation  Rhythm:Regular Rate:Normal  05/2021 TTE 1. Left ventricular ejection fraction, by estimation, is 60 to 65%. Left  ventricular ejection fraction by 3D volume is 62 %. The left ventricle has  normal function. The left ventricle has no regional wall motion  abnormalities. Left ventricular diastolic   parameters are consistent with Grade I diastolic dysfunction (impaired  relaxation).   2. Right ventricular systolic function is normal. The right ventricular  size is normal. There is normal pulmonary artery systolic pressure. The  estimated right ventricular systolic pressure is 79.1 mmHg.   3. Left atrial size was moderately dilated.   4. The mitral valve is abnormal. Trivial mitral valve regurgitation.  Moderate mitral annular calcification.   5. The aortic valve is tricuspid. Aortic valve regurgitation is not  visualized.   6. The inferior vena cava is normal in size with greater than 50%  respiratory variability, suggesting right atrial pressure of 3 mmHg.      Neuro/Psych S/P resection of Brain tumor  negative psych ROS   GI/Hepatic   Endo/Other  diabetes, Type 2, Insulin Dependent  Morbid obesity (BMI 42.9)  Renal/GU Renal diseaseLab Results      Component                Value               Date                       CREATININE               0.85                05/27/2022                  K                        4.2                 05/27/2022                   Musculoskeletal   Abdominal   Peds  Hematology On Eliquis  Lab Results      Component                Value               Date                            HGB                      11.7 (L)            05/27/2022                HCT  37.6                05/27/2022                PLT                      262                 05/27/2022              Anesthesia Other Findings All: Metoprolol, norvasc, percodan, neosporin  Reproductive/Obstetrics                             Anesthesia Physical Anesthesia Plan  ASA: 3  Anesthesia Plan: General   Post-op Pain Management: Ofirmev IV (intra-op)*   Induction: Intravenous  PONV Risk Score and Plan: 4 or greater and Treatment may vary due to age or medical condition and Ondansetron  Airway Management Planned: LMA  Additional Equipment: None  Intra-op Plan:   Post-operative Plan:   Informed Consent:      Dental advisory given  Plan Discussed with:   Anesthesia Plan Comments: (Pt on Semiglutide q week)        Anesthesia Quick Evaluation

## 2022-06-24 ENCOUNTER — Ambulatory Visit (HOSPITAL_BASED_OUTPATIENT_CLINIC_OR_DEPARTMENT_OTHER): Payer: Medicare Other | Admitting: Certified Registered"

## 2022-06-24 ENCOUNTER — Ambulatory Visit (HOSPITAL_COMMUNITY)
Admission: RE | Admit: 2022-06-24 | Discharge: 2022-06-24 | Disposition: A | Discharge status: Home / Self Care | Payer: Medicare Other | Attending: Urology | Admitting: Urology

## 2022-06-24 ENCOUNTER — Ambulatory Visit (HOSPITAL_COMMUNITY): Payer: Medicare Other | Admitting: Certified Registered"

## 2022-06-24 ENCOUNTER — Encounter (HOSPITAL_COMMUNITY)
Admission: RE | Disposition: A | Discharge status: Home / Self Care | Payer: Self-pay | Source: Home / Self Care | Attending: Urology

## 2022-06-24 ENCOUNTER — Encounter (HOSPITAL_COMMUNITY): Payer: Self-pay | Admitting: Urology

## 2022-06-24 ENCOUNTER — Ambulatory Visit (HOSPITAL_COMMUNITY): Payer: Medicare Other

## 2022-06-24 DIAGNOSIS — N200 Calculus of kidney: Secondary | ICD-10-CM | POA: Insufficient documentation

## 2022-06-24 DIAGNOSIS — I480 Paroxysmal atrial fibrillation: Secondary | ICD-10-CM | POA: Diagnosis not present

## 2022-06-24 DIAGNOSIS — E119 Type 2 diabetes mellitus without complications: Secondary | ICD-10-CM | POA: Diagnosis not present

## 2022-06-24 DIAGNOSIS — R310 Gross hematuria: Secondary | ICD-10-CM | POA: Insufficient documentation

## 2022-06-24 DIAGNOSIS — I10 Essential (primary) hypertension: Secondary | ICD-10-CM | POA: Insufficient documentation

## 2022-06-24 DIAGNOSIS — Z6841 Body Mass Index (BMI) 40.0 and over, adult: Secondary | ICD-10-CM | POA: Insufficient documentation

## 2022-06-24 DIAGNOSIS — Z794 Long term (current) use of insulin: Secondary | ICD-10-CM | POA: Insufficient documentation

## 2022-06-24 DIAGNOSIS — Z7901 Long term (current) use of anticoagulants: Secondary | ICD-10-CM | POA: Diagnosis not present

## 2022-06-24 DIAGNOSIS — I4819 Other persistent atrial fibrillation: Secondary | ICD-10-CM

## 2022-06-24 HISTORY — PX: HOLMIUM LASER APPLICATION: SHX5852

## 2022-06-24 HISTORY — PX: CYSTOSCOPY WITH RETROGRADE PYELOGRAM, URETEROSCOPY AND STENT PLACEMENT: SHX5789

## 2022-06-24 LAB — GLUCOSE, CAPILLARY
Glucose-Capillary: 198 mg/dL — ABNORMAL HIGH (ref 70–99)
Glucose-Capillary: 101 mg/dL — ABNORMAL HIGH (ref 70–99)
Glucose-Capillary: 126 mg/dL — ABNORMAL HIGH (ref 70–99)

## 2022-06-24 LAB — BASIC METABOLIC PANEL
Anion gap: 6 (ref 5–15)
BUN: 16 mg/dL (ref 8–23)
CO2: 24 mmol/L (ref 22–32)
Calcium: 8.5 mg/dL — ABNORMAL LOW (ref 8.9–10.3)
Chloride: 110 mmol/L (ref 98–111)
Creatinine, Ser: 0.91 mg/dL (ref 0.44–1.00)
GFR, Estimated: >60 mL/min (ref >60)
Glucose, Bld: 105 mg/dL — ABNORMAL HIGH (ref 70–99)
Potassium: 3.7 mmol/L (ref 3.5–5.1)
Sodium: 140 mmol/L (ref 135–145)

## 2022-06-24 LAB — CBC
HCT: 32.5 % — ABNORMAL LOW (ref 36.0–46.0)
Hemoglobin: 10.9 g/dL — ABNORMAL LOW (ref 12.0–15.0)
MCH: 30.1 pg (ref 26.0–34.0)
MCHC: 33.5 g/dL (ref 30.0–36.0)
MCV: 89.8 fL (ref 80.0–100.0)
Platelets: 296 10*3/uL (ref 150–400)
RBC: 3.62 MIL/uL — ABNORMAL LOW (ref 3.87–5.11)
RDW: 15.3 % (ref 11.5–15.5)
WBC: 7.8 10*3/uL (ref 4.0–10.5)
nRBC: 0 % (ref 0.0–0.2)

## 2022-06-24 SURGERY — CYSTOURETEROSCOPY, WITH RETROGRADE PYELOGRAM AND STENT INSERTION
Anesthesia: General | Laterality: Left

## 2022-06-24 MED ORDER — PHENYLEPHRINE 80 MCG/ML (10ML) SYRINGE FOR IV PUSH (FOR BLOOD PRESSURE SUPPORT)
PREFILLED_SYRINGE | INTRAVENOUS | Status: AC
  Filled 2022-06-24: qty 10

## 2022-06-24 MED ORDER — LIDOCAINE 2% (20 MG/ML) 5 ML SYRINGE
INTRAMUSCULAR | Status: DC | PRN
  Administered 2022-06-24: 60 mg via INTRAVENOUS

## 2022-06-24 MED ORDER — IOHEXOL 300 MG/ML  SOLN
INTRAMUSCULAR | Status: DC | PRN
  Administered 2022-06-24: 6 mL

## 2022-06-24 MED ORDER — ONDANSETRON HCL 4 MG/2ML IJ SOLN
4.0000 mg | Freq: Once | INTRAMUSCULAR | Status: AC | PRN
  Administered 2022-06-24: 4 mg via INTRAVENOUS

## 2022-06-24 MED ORDER — ONDANSETRON HCL 4 MG/2ML IJ SOLN
INTRAMUSCULAR | Status: DC | PRN
  Administered 2022-06-24: 4 mg via INTRAVENOUS

## 2022-06-24 MED ORDER — SODIUM CHLORIDE 0.9 % IR SOLN
Status: DC | PRN
  Administered 2022-06-24: 3000 mL

## 2022-06-24 MED ORDER — FENTANYL CITRATE (PF) 100 MCG/2ML IJ SOLN
INTRAMUSCULAR | Status: DC | PRN
  Administered 2022-06-24 (×2): 50 ug via INTRAVENOUS

## 2022-06-24 MED ORDER — LACTATED RINGERS IV SOLN: INTRAVENOUS | Status: DC

## 2022-06-24 MED ORDER — LIDOCAINE HCL (PF) 2 % IJ SOLN
INTRAMUSCULAR | Status: AC
  Filled 2022-06-24: qty 5

## 2022-06-24 MED ORDER — FENTANYL CITRATE PF 50 MCG/ML IJ SOSY: 25.0000 ug | PREFILLED_SYRINGE | INTRAMUSCULAR | Status: DC | PRN

## 2022-06-24 MED ORDER — DEXAMETHASONE SODIUM PHOSPHATE 10 MG/ML IJ SOLN
INTRAMUSCULAR | Status: AC
  Filled 2022-06-24: qty 1

## 2022-06-24 MED ORDER — DEXAMETHASONE SODIUM PHOSPHATE 10 MG/ML IJ SOLN
INTRAMUSCULAR | Status: DC | PRN
  Administered 2022-06-24: 5 mg via INTRAVENOUS

## 2022-06-24 MED ORDER — PHENYLEPHRINE 80 MCG/ML (10ML) SYRINGE FOR IV PUSH (FOR BLOOD PRESSURE SUPPORT)
PREFILLED_SYRINGE | INTRAVENOUS | Status: DC | PRN
  Administered 2022-06-24: 160 ug via INTRAVENOUS

## 2022-06-24 MED ORDER — ONDANSETRON HCL 4 MG/2ML IJ SOLN
INTRAMUSCULAR | Status: AC
  Filled 2022-06-24: qty 2

## 2022-06-24 MED ORDER — 0.9 % SODIUM CHLORIDE (POUR BTL) OPTIME
TOPICAL | Status: DC | PRN
  Administered 2022-06-24: 1000 mL

## 2022-06-24 MED ORDER — PROPOFOL 10 MG/ML IV BOLUS
INTRAVENOUS | Status: AC
  Filled 2022-06-24: qty 20

## 2022-06-24 MED ORDER — AMISULPRIDE (ANTIEMETIC) 5 MG/2ML IV SOLN
10.0000 mg | Freq: Once | INTRAVENOUS | Status: AC
  Administered 2022-06-24: 10 mg via INTRAVENOUS

## 2022-06-24 MED ORDER — FENTANYL CITRATE (PF) 100 MCG/2ML IJ SOLN
INTRAMUSCULAR | Status: AC
  Filled 2022-06-24: qty 2

## 2022-06-24 MED ORDER — SULFAMETHOXAZOLE-TRIMETHOPRIM 800-160 MG PO TABS: 1.0000 | ORAL_TABLET | Freq: Two times a day (BID) | ORAL | 0 refills | Status: DC

## 2022-06-24 MED ORDER — HYDROCODONE-ACETAMINOPHEN 10-325 MG PO TABS: 0.5000 | ORAL_TABLET | Freq: Four times a day (QID) | ORAL | 0 refills | Status: DC | PRN

## 2022-06-24 MED ORDER — CHLORHEXIDINE GLUCONATE 0.12 % MT SOLN
15.0000 mL | Freq: Once | OROMUCOSAL | Status: AC
  Administered 2022-06-24: 15 mL via OROMUCOSAL

## 2022-06-24 MED ORDER — PROPOFOL 500 MG/50ML IV EMUL
INTRAVENOUS | Status: DC | PRN
  Administered 2022-06-24: 150 mg via INTRAVENOUS

## 2022-06-24 MED ORDER — ORAL CARE MOUTH RINSE: 15.0000 mL | Freq: Once | OROMUCOSAL | Status: AC

## 2022-06-24 MED ORDER — ACETAMINOPHEN 10 MG/ML IV SOLN: 1000.0000 mg | Freq: Once | INTRAVENOUS | Status: DC | PRN

## 2022-06-24 MED ORDER — AMISULPRIDE (ANTIEMETIC) 5 MG/2ML IV SOLN
INTRAVENOUS | Status: AC
  Filled 2022-06-24: qty 4

## 2022-06-24 SURGICAL SUPPLY — 26 items
BAG URO CATCHER STRL LF (MISCELLANEOUS) ×2 IMPLANT
BASKET LASER NITINOL 1.9FR (BASKET) IMPLANT
BASKET STONE NCOMPASS (UROLOGICAL SUPPLIES) IMPLANT
BSKT STON RTRVL 120 1.9FR (BASKET)
CATH URETL OPEN END 6FR 70 (CATHETERS) ×2 IMPLANT
CLOTH BEACON ORANGE TIMEOUT ST (SAFETY) ×2 IMPLANT
EXTRACTOR STONE 1.7FRX115CM (UROLOGICAL SUPPLIES) IMPLANT
GLOVE SURG LX STRL 7.5 STRW (GLOVE) ×2 IMPLANT
GOWN STRL REUS W/ TWL XL LVL3 (GOWN DISPOSABLE) ×2 IMPLANT
GOWN STRL REUS W/TWL XL LVL3 (GOWN DISPOSABLE) ×1
GUIDEWIRE ANG ZIPWIRE 038X150 (WIRE) ×2 IMPLANT
GUIDEWIRE STR DUAL SENSOR (WIRE) ×2 IMPLANT
KIT TURNOVER KIT A (KITS) IMPLANT
LASER FIB FLEXIVA PULSE ID 365 (Laser) IMPLANT
LASER FIB FLEXIVA PULSE ID 550 (Laser) IMPLANT
LASER FIB FLEXIVA PULSE ID 910 (Laser) IMPLANT
MANIFOLD NEPTUNE II (INSTRUMENTS) ×2 IMPLANT
PACK CYSTO (CUSTOM PROCEDURE TRAY) ×2 IMPLANT
SHEATH NAVIGATOR HD 11/13X28 (SHEATH) IMPLANT
SHEATH NAVIGATOR HD 11/13X36 (SHEATH) IMPLANT
STENT POLARIS 5FRX24 (STENTS) IMPLANT
TRACTIP FLEXIVA PULS ID 200XHI (Laser) IMPLANT
TRACTIP FLEXIVA PULSE ID 200 (Laser)
TUBE PU 8FR 16IN ENFIT (TUBING) ×2 IMPLANT
TUBING CONNECTING 10 (TUBING) ×2 IMPLANT
TUBING UROLOGY SET (TUBING) ×2 IMPLANT

## 2022-06-24 NOTE — Progress Notes (Signed)
Dr. Valma Cava and Dr. Tresa Moore aware that patient had eggnog this morning '@0630'$ .  Surgery will be delayed till 1630.  Pt aware; no further orders at this time

## 2022-06-24 NOTE — Discharge Instructions (Signed)
1 - You may have urinary urgency (bladder spasms) and bloody urine on / off with stent in place. This is normal. ° °2 - Call MD or go to ER for fever >102, severe pain / nausea / vomiting not relieved by medications, or acute change in medical status ° °

## 2022-06-24 NOTE — H&P (Signed)
Carrie Newton is an 77 y.o. female.    Chief Complaint: Pre-OP LEFT 2nd stage ureteroscopic stone manipulation  HPI:   1 - Gross Hematuria, Irritative Voiding - on / off gross hematurai x few mos 2021. UCX negative. She is on eliquus for AFib. Non smoker. NO chronic solvent exposure. Hematuria CT 12/2019 with small left renal stone, no upper tract masses.   2 - Left Renal Stone - incidetnal 1m LLP stone w/o hydro on hematuria CT 12/2019. No h/o colic.   83/8882- KUB / Renal [place]- 11mLLP stone, Recent Cr 1.3  05/2022 - KUB, Renal [place]1648[place] stone.   PMH sig for morbid obesity, AFib/Eliquus (follows J. Varanassi), IDDM2 (A1c 7s), benign vag-hyst, ortho surery. 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 No interval fevers. Cards clearance on file. Most recent UCX non-clonal. She had some egg nog for hypoglycemia late last night and forunately we are able to flip cases to still proceed just later today.   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 ScoWyatt Portela-16-2022   PAF (paroxysmal atrial fibrillation) ([place]CC[place]  Vision changes 12/2021   double vision    Past Surgical History:  Procedure Laterality Date   BIOPSY  04/05/2019   Procedure: BIOPSY;  Surgeon: GanWonda HornerD;  [facility];  Service: Endoscopy;;   BREAST LUMPECTOMY  07/06/1990   Left breast   CARPAL TUNNEL RELEASE     Left hand 05/2012 and right hand dec 2013    COLONOSCOPY WITH PROPOFOL N/A 04/05/2019   Procedure: COLONOSCOPY WITH PROPOFOL;  Surgeon: GanWonda HornerD;  [facility];  Service: Endoscopy;  Laterality: N/A;   CYSTOSCOPY WITH RETROGRADE PYELOGRAM, URETEROSCOPY AND STENT PLACEMENT Left 06/03/2022   Procedure: CYSTOSCOPY WITH RETROGRADE PYELOGRAM, URETEROSCOPY AND STENT PLACEMENT FIRST STAGE;  Surgeon: ManAlexis FrockD;  Location: [facility];  Service: Urology;  Laterality: Left;  90 MINS   ESOPHAGOGASTRODUODENOSCOPY (EGD) WITH PROPOFOL N/A 04/05/2019   Procedure: ESOPHAGOGASTRODUODENOSCOPY (EGD) WITH PROPOFOL;  Surgeon: GanWonda HornerD;  [facility];  Service: Endoscopy;  Laterality: N/A;   HOLMIUM LASER APPLICATION Left 11/91/50/5697Procedure: HOLMIUM LASER APPLICATION;  Surgeon: ManAlexis FrockD;  Location: [facility];  Service: Urology;  Laterality: Left;   KNEE ARTHROSCOPY Left 2011   TRIGGER FINGER RELEASE  07/06/2013   Left thumb    Family History  Problem Relation Age of Onset   Hypertension Father    Heart attack Father    Stroke Neg Hx    Social History:  reports that she has never smoked. She has never used smokeless tobacco. She reports that she does not drink alcohol and does not use drugs.  Allergies:  Allergies  Allergen Reactions   Metoprolol Tartrate Other (See Comments)    Junctional Bradycardia    Neosporin [Neomycin-Bacitracin Zn-Polymyx] Rash   Norvasc [Amlodipine Besylate] Rash  Percodan [Oxycodone-Aspirin] Other (See Comments)    Tingling     No medications prior to admission.    No results found for this or any previous visit (from the past 48 hour(s)). No results found.  Review of Systems  Constitutional:  Negative for fever.  Genitourinary:  Positive for hematuria and urgency.  All other systems reviewed and are negative.   Height _0  (1.6 m), weight 109.8 kg. Physical Exam Vitals reviewed.  Constitutional:      Comments: Pleasant but with some stable physical  frailty  HENT:     Nose: Nose normal.  Cardiovascular:     Rate and Rhythm: Normal rate.  Pulmonary:     Effort: Pulmonary effort is normal.  Abdominal:     Comments: Stable morbid truncal obesity  Genitourinary:    Comments: No CVAT at present Musculoskeletal:        General: Normal range of motion.     Cervical back: Normal range of motion.  Skin:    General: Skin is warm.  Neurological:     General: No focal deficit present.     Mental Status: She is alert.  Psychiatric:        Mood and Affect: Mood normal.      Assessment/Plan  Proceed as planned with LEFT 2nd stage ureteroscopic stone manipulation. Risks, benefits, alternatives ,expected peri-op course discussed previously and reiterated today. She understands that her metabolic and CV comorbity increases risk of all peri-op complications.   Greatly appreciate anesthesia  and peri-op staff willingness to still help Carrie Newton proceed today.   Alexis Frock, MD 06/24/2022, 6:42 AM

## 2022-06-24 NOTE — Anesthesia Procedure Notes (Addendum)
Procedure Name: LMA Insertion Date/Time: 06/24/2022 4:31 PM  Performed by: Gerald Leitz, CRNAPre-anesthesia Checklist: Patient identified, Patient being monitored, Timeout performed, Emergency Drugs available and Suction available Patient Re-evaluated:Patient Re-evaluated prior to induction Oxygen Delivery Method: Circle system utilized Preoxygenation: Pre-oxygenation with 100% oxygen Induction Type: IV induction Ventilation: Mask ventilation without difficulty LMA: LMA inserted LMA Size: 4.0 Tube type: Oral Number of attempts: 1 Placement Confirmation: positive ETCO2 and breath sounds checked- equal and bilateral Tube secured with: Tape Dental Injury: Teeth and Oropharynx as per pre-operative assessment

## 2022-06-24 NOTE — Brief Op Note (Signed)
06/24/2022  5:20 PM  PATIENT:  Carrie Newton  76 y.o. female  PRE-OPERATIVE DIAGNOSIS:  RESIDUAL LEFT RENAL STONE  POST-OPERATIVE DIAGNOSIS:  RESIDUAL LEFT RENAL STONE  PROCEDURE:  Procedure(s) with comments: SECOND STAGE CYSTOSCOPY WITH RETROGRADE PYELOGRAM, URETEROSCOPY AND STENT EXCHANGE AND LASER (Left) - 75 MINS HOLMIUM LASER APPLICATION (Left)  SURGEON:  Surgeon(s) and Role:    Alexis Frock, MD - Primary  PHYSICIAN ASSISTANT:   ASSISTANTS: none   ANESTHESIA:   general  EBL:  minimal   BLOOD ADMINISTERED:none  DRAINS: none   LOCAL MEDICATIONS USED:  NONE  SPECIMEN:  Source of Specimen:  residual left renal stone fragments  DISPOSITION OF SPECIMEN:   given to patient  COUNTS:  YES  TOURNIQUET:  * No tourniquets in log *  DICTATION: .Other Dictation: Dictation Number [id number]  PLAN OF CARE: Discharge to home after PACU  PATIENT DISPOSITION:  PACU - hemodynamically stable.   Delay start of Pharmacological VTE agent (>24hrs) due to surgical blood loss or risk of bleeding: yes

## 2022-06-24 NOTE — Transfer of Care (Signed)
Immediate Anesthesia Transfer of Care Note  Patient: Carrie Newton  Procedure(s) Performed: Procedure(s) with comments: SECOND STAGE CYSTOSCOPY WITH RETROGRADE PYELOGRAM, URETEROSCOPY AND STENT EXCHANGE AND LASER (Left) - 54 MINS HOLMIUM LASER APPLICATION (Left)  Patient Location: PACU  Anesthesia Type:General  Level of Consciousness: Alert, Awake, Oriented  Airway & Oxygen Therapy: Patient Spontanous Breathing  Post-op Assessment: Report given to RN  Post vital signs: Reviewed and stable  Last Vitals:  Vitals:   06/24/22 1012 06/24/22 1731  BP: (!) 126/52 (!) 147/53  Pulse:  61  Resp:  14  Temp:  37.2 C  SpO2:  543%    Complications: No apparent anesthesia complications

## 2022-06-24 NOTE — Anesthesia Postprocedure Evaluation (Signed)
Anesthesia Post Note  Patient: Carrie Newton  Procedure(s) Performed: SECOND STAGE CYSTOSCOPY WITH RETROGRADE PYELOGRAM, URETEROSCOPY AND STENT EXCHANGE AND LASER (Left) HOLMIUM LASER APPLICATION (Left)     Patient location during evaluation: PACU Anesthesia Type: General Level of consciousness: awake and alert Pain management: pain level controlled Vital Signs Assessment: post-procedure vital signs reviewed and stable Respiratory status: spontaneous breathing, nonlabored ventilation, respiratory function stable and patient connected to nasal cannula oxygen Cardiovascular status: blood pressure returned to baseline and stable Postop Assessment: no apparent nausea or vomiting Anesthetic complications: no  No notable events documented.  Last Vitals:  Vitals:   06/24/22 1745 06/24/22 1815  BP: (!) 152/40 125/80  Pulse: (!) 57 (!) 59  Resp: 14 14  Temp:  37 C  SpO2: 96% 97%    Last Pain:  Vitals:   06/24/22 1815  TempSrc:   PainSc: 0-No pain                 Tyon Cerasoli L Benjerman Molinelli

## 2022-06-25 ENCOUNTER — Encounter (HOSPITAL_COMMUNITY): Payer: Self-pay | Admitting: Urology

## 2022-06-25 NOTE — Op Note (Signed)
NAMEKELY, DOHN MEDICAL RECORD NO: [id number] ACCOUNT NO: [id number] DATE OF BIRTH: 1945-08-05 FACILITY: [facility] LOCATION: [facility] PHYSICIAN: Alexis Frock, MD  Operative Report   DATE OF PROCEDURE: 06/24/2022  PREOPERATIVE DIAGNOSIS:  Residual left renal stone.  PROCEDURE PERFORMED:  1.  Cystoscopy, left retrograde pyelogram, interpretation. 2.  Left ureteroscopy with laser lithotripsy and stent exchange, second stage.  ESTIMATED BLOOD LOSS:  Nil.  COMPLICATIONS:  None.  SPECIMEN:  Residual left renal stone fragments given to the patient.  FINDINGS:  1.  Approximately 8-9 mm total residual left extreme lower pole residual renal stone fragments. 2.  Complete resolution of all accessible stone fragments larger than one-third mm following laser lithotripsy and basket extraction. 3.  Successful replacement of left ureteral stent, proximal end in the renal pelvis, distal end in urinary bladder, without tether.  INDICATIONS:  The patient is a very pleasant, but somewhat comorbid 76 year old lady with a history of a known left renal stone for several years that does cause intermittent hematuria.  She has been on surveillance for this.  However, its size has  significantly grown over the past several years, now approaching nearly 2 cm, with most recent imaging on surveillance showing location at the [facility], likely consistent with some intermittent obstruction. Given the larger size and now a threat to renal  function, it was felt that treatment will be warranted, she wished to proceed with ureteroscopy.  She underwent first stage procedure several weeks ago, at which total vast majority of her stone burden was addressed.  However, given the large stone, it  was felt that second stage procedure was clearly warranted to render stone free.  She also is on blood thinners, making visualization somewhat suboptimal.  She presents for second stage procedure today.  Informed consent was obtained and  placed in  medical record.  PROCEDURE IN DETAIL:  The patient being verified, procedure being left second stage ureteroscopic stone manipulation was confirmed.  Procedure timeout was performed.  Intravenous antibiotics were administered.  General anesthesia was induced, the patient  placed into a low lithotomy position.  Sterile field was created, prepped and draped the patient's vagina, introitus, and proximal thighs using iodine.  Cystourethroscopy was performed using 21-French rigid cystoscope with offset lens.  Inspection of  urinary bladder revealed no diverticula, calcifications or papillary lesions.  Distal end of left ureteral stent was seen in situ, was grasped, brought to the level of the urethral meatus.  A 0.038 ZIPwire was advanced to the level of the kidney. Stent  was exchanged for open-ended catheter and left retrograde pyelogram was obtained.  Left retrograde pyelogram demonstrated a single left ureter, single system left kidney.  No obvious filling defects or narrowing noted.  ZIPwire was once again advanced, set aside as a safety wire.  An 8-French feeding tube placed in the urinary bladder  for pressure release.  Next, semirigid ureteroscopy performed of the distal four-fifths of left ureter alongside a separate sensor working wire.  There were several small calcifications within the ureter that were retrograde positioned to the level of  the kidney, was otherwise unremarkable.  The semirigid scope was then exchanged for a short length ureteral access sheath to the level of proximal ureter using continuous fluoroscopic guidance and flexible digital ureteroscopy was performed of proximal  left ureter and systematic inspection of left kidney including all calices x3. As per before, given her anticoagulant use, kidney was somewhat inflamed and bloody, but visualization is acceptable.  There was noted to  be residual stone volume in an  acutely lower pole calix.  Estimated total volume  approximately 8 to 9 mm2.  Most of these fragments appeared to be amenable to simple basketing and the Encompass basket was used in a net technique, the vast majority of the stone fragments were removed  Such that any stone fragments that were quite small at less than one-third mm, and they were addressed using a popcorn technique with holmium laser lithotripsy, escalating settings up to 1 joule and 15 Hz, such that all remaining stone was essentially just sand  and dust.  Following this complete resolution of all accessible stone fragments larger than one-third mm, excellent hemostasis, no evidence of perforation, access sheath was removed under continuous vision, no significant mucosal abnormalities were  found.  Given dusting technique, it was felt that interval stenting with non-tethered stent would be most prudent to allow interval passage of the very small residual stone burden and a new 5 x 24 Polaris type stent was placed over remaining safety wire  using fluoroscopic guidance.  Good proximal and distal planes were noted.  Procedure was terminated.  The patient tolerated the procedure well, no immediate periprocedural complications.  The patient was taken to postanesthesia care in stable condition  with plan for discharge home.  She will have her stent removed in the office in an elective setting.   SHW D: 06/24/2022 5:26:14 pm T: 06/25/2022 2:09:00 am  JOB: [id number]/ [id number]

## 2022-07-10 NOTE — Telephone Encounter (Signed)
Pt to speak to PCP regarding zoloft.   Still having urological issues with stenting - seeing urology again on 1/15 and will let me know status - if she will need further procedures or can proceed with tikosyn at that time.

## 2022-07-22 ENCOUNTER — Other Ambulatory Visit: Payer: Self-pay | Admitting: Urology

## 2022-07-26 NOTE — Progress Notes (Addendum)
COVID Vaccine received:  _0  No _1  Yes Date of any COVID positive Test in last 90 days:   PCP - Serita Grammes, MD   [facility] Cardiologist - Casandra Doffing, MD EP-  Doralee Albino, MD (possible trial of Tikosyn in the future) Neurosurgery- Consuella Lose, MD  Chest x-ray -  EKG - 05-21-2022  ep[facility]  Stress Test -  ECHO - 05-19-2021  ep[facility] Cardiac Cath -   PCR screen: _2  Ordered & Completed                      _3   No Order but Needs PROFEND                      _4   N/A for this surgery  Surgery Plan:  _5  Ambulatory                            _6  Outpatient in bed                            _7  Admit  Anesthesia:    _8  General  _9  Spinal                           _10   Choice _11   MAC  Bowel Prep - _12  No  _13   Yes _____________  Pacemaker / ICD device _14  No _15  Yes        Device order form faxed _16  No    _17   Yes      Faxed to:  Spinal Cord Stimulator:_18  No _19  Yes      (Remind patient to bring remote DOS) Other Implants:   History of Sleep Apnea? _20  No _21  Yes   CPAP used?- _22  No _23  Yes    Does the patient monitor blood sugar? _24  No _25  Yes  _26  N/A Patient has: _27  Pre-DM   _28  DM1  _29   DM2 Does patient have a [employer] or Dexacom? _30  No _31  Yes   Fasting Blood Sugar Ranges- 92-110 Checks Blood Sugar _2-3_ times a day  Last dose of GLP1 agonist- Ozempic , takes on Saturday. GLP1 instructions: Don't take 7 days prior, Last Dose: 01-13 -24. Resume the Saturday after surgery.  Diabetic medications/ instructions:  Insulin Degludec Tyler Aas) - Day Before Surgery: take usual dose in the morning, hold the evening dose. DOS: Don't take.   Insulin lispro (Humalog)- Day Before Surgery: Take usual dose.  DOS: If CBG > 200 mg/dL, may take 1/2 of usual correction dose.   Blood Thinner / Instructions:Eliquis, Per Dr. Tresa Moore, patient is to stay on all anticoags, No hold. Aspirin Instructions:  None  ERAS Protocol Ordered: _32  No  _33  Yes  Patient is to  be NPO after: Midnight prior  Comments: Wears special glasses for double vision; she falls often with these.   Activity level: Patient can not climb a flight of stairs without difficulty; _34  No CP  but would have _SOB_   Patient can perform ADLs without assistance.   Anesthesia review: DM2, HTN, PAF, PONV, Benign brain tumor (meningioma) 12-2021 still has Double vision and wears special glasses.   Patient denies shortness of breath, fever, cough and chest pain at PAT appointment.  Patient verbalized understanding and agreement to the Pre-Surgical Instructions that were given to them at this PAT appointment.  Patient was also educated of the need to review these PAT instructions again prior to his/her surgery.I reviewed the appropriate phone numbers to call if they have any and questions or concerns.

## 2022-07-26 NOTE — Patient Instructions (Addendum)
SURGICAL WAITING ROOM VISITATION Patients having surgery or a procedure may have no more than 2 support people in the waiting area - these visitors may rotate in the visitor waiting room.   Due to an increase in RSV and influenza rates and associated hospitalizations, children ages 39 and under may not visit patients in [place]. If the patient needs to stay at the hospital during part of their recovery, the visitor guidelines for inpatient rooms apply.  PRE-OP VISITATION  Pre-op nurse will coordinate an appropriate time for 1 support person to accompany the patient in pre-op.  This support person may not rotate.  This visitor will be contacted when the time is appropriate for the visitor to come back in the pre-op area.  Please refer to the [facility] website for the visitor guidelines for Inpatients (after your surgery is over and you are in a regular room).  You are not required to quarantine at this time prior to your surgery. However, you must do this: Hand Hygiene often Do NOT share personal items Notify your provider if you are in close contact with someone who has COVID or you develop fever 100.4 or greater, new onset of sneezing, cough, sore throat, shortness of breath or body aches.  If you test positive for Covid or have been in contact with anyone that has tested positive in the last 10 days please notify you surgeon.    Your procedure is scheduled on:  Wednesday July 29, 2022  Report to [facility] Main Entrance: [address] entrance where the [employer] is available.   Report to admitting at:  1:00 PM  +++++Call this number if you have any questions or problems the morning of surgery [phone]   How to Manage Your Diabetes Before and After Surgery  Why is it important to control my blood sugar before and after surgery? Improving blood sugar levels before and after surgery helps healing and can limit problems. A way of improving blood sugar  control is eating a healthy diet by:  Eating less sugar and carbohydrates  Increasing activity/exercise  Talking with your doctor about reaching your blood sugar goals High blood sugars (greater than 180 mg/dL) can raise your risk of infections and slow your recovery, so you will need to focus on controlling your diabetes during the weeks before surgery. Make sure that the doctor who takes care of your diabetes knows about your planned surgery including the date and location.  How do I manage my blood sugar before surgery? Check your blood sugar at least 4 times a day, starting 2 days before surgery, to make sure that the level is not too high or low. Check your blood sugar the morning of your surgery when you wake up and every 2 hours until you get to the Short Stay unit. If your blood sugar is less than 70 mg/dL, you will need to treat for low blood sugar: Do not take insulin. Treat a low blood sugar (less than 70 mg/dL) with  cup of clear juice (cranberry or apple), 4 glucose tablets, OR glucose gel. Recheck blood sugar in 15 minutes after treatment (to make sure it is greater than 70 mg/dL). If your blood sugar is not greater than 70 mg/dL on recheck, call [phone] for further instructions. Report your blood sugar to the short stay nurse when you get to Short Stay.  If you are admitted to the hospital after surgery: Your blood sugar will be checked by the staff and  you will probably be given insulin after surgery (instead of oral diabetes medicines) to make sure you have good blood sugar levels. The goal for blood sugar control after surgery is 80-180 mg/dL.   WHAT DO I DO ABOUT MY DIABETES MEDICATION?  Ozempic , takes on Saturday. GLP1 instructions: Don't take 7 days prior, Last Dose: 01-13 -24. Resume the Saturday after surgery. (08-01-22)  Insulin Degludec Tyler Aas) - Day Before Surgery: take usual dose in the morning, hold the evening dose. Day of Surgery:  Don't take [place].     Insulin lispro (Humalog)- Day Before Surgery: Take usual dose.  Day of surgery: If CBG > 200 mg/dL, may take 1/2 of usual correction dose.    IF you have any questions, call the nurse at [phone]  DO NOT EAT OR DRINK ANYTHING AFTER MIDNIGHT THE NIGHT PRIOR TO YOUR SURGERY / PROCEDURE.   FOLLOW BOWEL PREP AND ANY ADDITIONAL PRE OP INSTRUCTIONS YOU RECEIVED FROM YOUR SURGEON'S OFFICE!!!   Oral Hygiene is also important to reduce your risk of infection.        Remember - BRUSH YOUR TEETH THE MORNING OF SURGERY WITH YOUR REGULAR TOOTHPASTE  Take ONLY these medicines the morning of surgery with A SIP OF WATER: Bupropion (Wellbutrin), levothyroxine (Synthroid). You may take Hydrocodone (Norco) if needed for pain.  If You have been diagnosed with Sleep Apnea - Bring CPAP mask and tubing day of surgery. We will provide you with a CPAP machine on the day of your surgery.                   You may not have any metal on your body including hair pins, jewelry, and body piercing  Do not wear make-up, lotions, powders, perfumes  or deodorant  Do not wear nail polish including gel and S&S, artificial / acrylic nails, or any other type of covering on natural nails including finger and toenails. If you have artificial nails, gel coating, etc., that needs to be removed by a nail salon, Please have this removed prior to surgery. Not doing so may mean that your surgery could be cancelled or delayed if the Surgeon or anesthesia staff feels like they are unable to monitor you safely.   Do not shave 48 hours prior to surgery to avoid nicks in your skin which may contribute to postoperative infections.   You may bring a small overnight bag with you on the day of surgery, only pack items that are not valuable. [facility] IS NOT RESPONSIBLE   FOR VALUABLES THAT ARE LOST OR STOLEN.   Patients discharged on the day of surgery will not be allowed to drive home.  Someone NEEDS to stay with you for the first 24  hours after anesthesia.  Do not bring your home medications to the hospital. The Pharmacy will dispense medications listed on your medication list to you during your admission in the Hospital.  Special Instructions: Bring a copy of your healthcare power of attorney and living will documents the day of surgery, if you wish to have them scanned into your [facility] Records- EPIC  Please read over the following fact sheets you were given: IF YOU HAVE QUESTIONS ABOUT YOUR PRE-OP INSTRUCTIONS, PLEASE CALL [phone]  ([place])   [place] - Preparing for Surgery Before surgery, you can play an important role.  Because skin is not sterile, your skin needs to be as free of germs as possible.  You can reduce the number of germs on  your skin by washing with CHG (chlorahexidine gluconate) soap before surgery.  CHG is an antiseptic cleaner which kills germs and bonds with the skin to continue killing germs even after washing. Please DO NOT use if you have an allergy to CHG or antibacterial soaps.  If your skin becomes reddened/irritated stop using the CHG and inform your nurse when you arrive at Short Stay. Do not shave (including legs and underarms) for at least 48 hours prior to the first CHG shower.  You may shave your face/neck.  Please follow these instructions carefully:  1.  Shower with CHG Soap the night before surgery and the  morning of surgery.  2.  If you choose to wash your hair, wash your hair first as usual with your normal  shampoo.  3.  After you shampoo, rinse your hair and body thoroughly to remove the shampoo.                             4.  Use CHG as you would any other liquid soap.  You can apply chg directly to the skin and wash.  Gently with a scrungie or clean washcloth.  5.  Apply the CHG Soap to your body ONLY FROM THE NECK DOWN.   Do not use on face/ open                           Wound or open sores. Avoid contact with eyes, ears mouth and genitals (private parts).                        Wash face,  Genitals (private parts) with your normal soap.             6.  Wash thoroughly, paying special attention to the area where your  surgery  will be performed.  7.  Thoroughly rinse your body with warm water from the neck down.  8.  DO NOT shower/wash with your normal soap after using and rinsing off the CHG Soap.            9.  Pat yourself dry with a clean towel.            10.  Wear clean pajamas.            11.  Place clean sheets on your bed the night of your first shower and do not  sleep with pets.  ON THE DAY OF SURGERY : Do not apply any lotions/deodorants the morning of surgery.  Please wear clean clothes to the hospital/surgery center.    FAILURE TO FOLLOW THESE INSTRUCTIONS MAY RESULT IN THE CANCELLATION OF YOUR SURGERY  PATIENT SIGNATURE_________________________________  NURSE SIGNATURE__________________________________  ________________________________________________________________________

## 2022-07-28 ENCOUNTER — Other Ambulatory Visit: Payer: Self-pay

## 2022-07-28 ENCOUNTER — Encounter (HOSPITAL_COMMUNITY)
Admission: RE | Admit: 2022-07-28 | Discharge: 2022-07-28 | Disposition: A | Discharge status: Home / Self Care | Payer: Medicare Other | Source: Ambulatory Visit | Attending: Urology | Admitting: Urology

## 2022-07-28 ENCOUNTER — Encounter (HOSPITAL_COMMUNITY): Payer: Self-pay

## 2022-07-28 VITALS — BP 138/56 | HR 52 | Temp 98.9°F | Resp 16 | Ht 63.0 in | Wt 243.0 lb

## 2022-07-28 DIAGNOSIS — E119 Type 2 diabetes mellitus without complications: Secondary | ICD-10-CM | POA: Insufficient documentation

## 2022-07-28 DIAGNOSIS — Z01812 Encounter for preprocedural laboratory examination: Secondary | ICD-10-CM | POA: Diagnosis present

## 2022-07-28 DIAGNOSIS — I10 Essential (primary) hypertension: Secondary | ICD-10-CM | POA: Diagnosis not present

## 2022-07-28 DIAGNOSIS — Z794 Long term (current) use of insulin: Secondary | ICD-10-CM | POA: Insufficient documentation

## 2022-07-28 HISTORY — DX: Chronic kidney disease, unspecified: N18.9

## 2022-07-28 HISTORY — DX: Unspecified osteoarthritis, unspecified site: M19.90

## 2022-07-28 HISTORY — DX: Anxiety disorder, unspecified: F41.9

## 2022-07-28 HISTORY — DX: Other specified postprocedural states: R11.2

## 2022-07-28 HISTORY — DX: Nausea with vomiting, unspecified: Z98.890

## 2022-07-28 HISTORY — DX: Anemia, unspecified: D64.9

## 2022-07-28 HISTORY — DX: Personal history of urinary calculi: Z87.442

## 2022-07-28 LAB — HEMOGLOBIN A1C
Hgb A1c MFr Bld: 7.1 % — ABNORMAL HIGH (ref 4.8–5.6)
Mean Plasma Glucose: 157.07 mg/dL

## 2022-07-28 LAB — CBC
HCT: 34.6 % — ABNORMAL LOW (ref 36.0–46.0)
Hemoglobin: 10.8 g/dL — ABNORMAL LOW (ref 12.0–15.0)
MCH: 29.3 pg (ref 26.0–34.0)
MCHC: 31.2 g/dL (ref 30.0–36.0)
MCV: 93.8 fL (ref 80.0–100.0)
Platelets: 274 10*3/uL (ref 150–400)
RBC: 3.69 MIL/uL — ABNORMAL LOW (ref 3.87–5.11)
RDW: 14.6 % (ref 11.5–15.5)
WBC: 9.4 10*3/uL (ref 4.0–10.5)
nRBC: 0 % (ref 0.0–0.2)

## 2022-07-28 LAB — BASIC METABOLIC PANEL
Anion gap: 7 (ref 5–15)
BUN: 13 mg/dL (ref 8–23)
CO2: 26 mmol/L (ref 22–32)
Calcium: 8.5 mg/dL — ABNORMAL LOW (ref 8.9–10.3)
Chloride: 105 mmol/L (ref 98–111)
Creatinine, Ser: 0.88 mg/dL (ref 0.44–1.00)
GFR, Estimated: >60 mL/min (ref >60)
Glucose, Bld: 129 mg/dL — ABNORMAL HIGH (ref 70–99)
Potassium: 4.6 mmol/L (ref 3.5–5.1)
Sodium: 138 mmol/L (ref 135–145)

## 2022-07-28 LAB — GLUCOSE, CAPILLARY: Glucose-Capillary: 126 mg/dL — ABNORMAL HIGH (ref 70–99)

## 2022-07-29 ENCOUNTER — Ambulatory Visit (HOSPITAL_BASED_OUTPATIENT_CLINIC_OR_DEPARTMENT_OTHER)
Admission: RE | Admit: 2022-07-29 | Discharge: 2022-07-29 | Disposition: A | Discharge status: Home / Self Care | Payer: Medicare Other | Source: Ambulatory Visit | Attending: Urology | Admitting: Urology

## 2022-07-29 ENCOUNTER — Other Ambulatory Visit: Payer: Self-pay

## 2022-07-29 ENCOUNTER — Encounter (HOSPITAL_BASED_OUTPATIENT_CLINIC_OR_DEPARTMENT_OTHER): Payer: Self-pay | Admitting: Urology

## 2022-07-29 ENCOUNTER — Ambulatory Visit (HOSPITAL_COMMUNITY): Payer: Medicare Other

## 2022-07-29 ENCOUNTER — Ambulatory Visit (HOSPITAL_BASED_OUTPATIENT_CLINIC_OR_DEPARTMENT_OTHER): Payer: Medicare Other | Admitting: Certified Registered"

## 2022-07-29 ENCOUNTER — Encounter (HOSPITAL_BASED_OUTPATIENT_CLINIC_OR_DEPARTMENT_OTHER)
Admission: RE | Disposition: A | Discharge status: Home / Self Care | Payer: Self-pay | Source: Ambulatory Visit | Attending: Urology

## 2022-07-29 ENCOUNTER — Ambulatory Visit (HOSPITAL_BASED_OUTPATIENT_CLINIC_OR_DEPARTMENT_OTHER): Payer: Medicare Other | Admitting: Physician Assistant

## 2022-07-29 DIAGNOSIS — R319 Hematuria, unspecified: Secondary | ICD-10-CM | POA: Diagnosis not present

## 2022-07-29 DIAGNOSIS — E119 Type 2 diabetes mellitus without complications: Secondary | ICD-10-CM | POA: Insufficient documentation

## 2022-07-29 DIAGNOSIS — Z6841 Body Mass Index (BMI) 40.0 and over, adult: Secondary | ICD-10-CM | POA: Insufficient documentation

## 2022-07-29 DIAGNOSIS — T8389XA Other specified complication of genitourinary prosthetic devices, implants and grafts, initial encounter: Secondary | ICD-10-CM | POA: Diagnosis not present

## 2022-07-29 DIAGNOSIS — X58XXXA Exposure to other specified factors, initial encounter: Secondary | ICD-10-CM | POA: Insufficient documentation

## 2022-07-29 DIAGNOSIS — Z87442 Personal history of urinary calculi: Secondary | ICD-10-CM | POA: Diagnosis not present

## 2022-07-29 DIAGNOSIS — Z794 Long term (current) use of insulin: Secondary | ICD-10-CM

## 2022-07-29 DIAGNOSIS — I480 Paroxysmal atrial fibrillation: Secondary | ICD-10-CM | POA: Diagnosis not present

## 2022-07-29 DIAGNOSIS — Z7901 Long term (current) use of anticoagulants: Secondary | ICD-10-CM | POA: Insufficient documentation

## 2022-07-29 DIAGNOSIS — T83122A Displacement of urinary stent, initial encounter: Secondary | ICD-10-CM

## 2022-07-29 DIAGNOSIS — Z7984 Long term (current) use of oral hypoglycemic drugs: Secondary | ICD-10-CM

## 2022-07-29 DIAGNOSIS — F419 Anxiety disorder, unspecified: Secondary | ICD-10-CM | POA: Diagnosis not present

## 2022-07-29 DIAGNOSIS — I10 Essential (primary) hypertension: Secondary | ICD-10-CM | POA: Diagnosis not present

## 2022-07-29 HISTORY — PX: CYSTOSCOPY W/ URETERAL STENT REMOVAL: SHX1430

## 2022-07-29 LAB — GLUCOSE, CAPILLARY: Glucose-Capillary: 138 mg/dL — ABNORMAL HIGH (ref 70–99)

## 2022-07-29 SURGERY — REMOVAL, STENT, URETER, CYSTOSCOPIC
Anesthesia: General | Laterality: Left

## 2022-07-29 MED ORDER — SODIUM CHLORIDE 0.9 % IR SOLN
Status: DC | PRN
  Administered 2022-07-29: 3000 mL

## 2022-07-29 MED ORDER — ONDANSETRON HCL 4 MG/2ML IJ SOLN
INTRAMUSCULAR | Status: DC | PRN
  Administered 2022-07-29: 4 mg via INTRAVENOUS

## 2022-07-29 MED ORDER — DEXMEDETOMIDINE HCL IN NACL 80 MCG/20ML IV SOLN
INTRAVENOUS | Status: DC | PRN
  Administered 2022-07-29: 4 ug via BUCCAL

## 2022-07-29 MED ORDER — FENTANYL CITRATE (PF) 250 MCG/5ML IJ SOLN
INTRAMUSCULAR | Status: DC | PRN
  Administered 2022-07-29: 50 ug via INTRAVENOUS

## 2022-07-29 MED ORDER — DEXAMETHASONE SODIUM PHOSPHATE 10 MG/ML IJ SOLN
INTRAMUSCULAR | Status: DC | PRN
  Administered 2022-07-29: 4 mg via INTRAVENOUS

## 2022-07-29 MED ORDER — GENTAMICIN SULFATE 40 MG/ML IJ SOLN
360.0000 mg | INTRAVENOUS | Status: AC
  Administered 2022-07-29: 360 mg via INTRAVENOUS
  Filled 2022-07-29: qty 9

## 2022-07-29 MED ORDER — FENTANYL CITRATE (PF) 100 MCG/2ML IJ SOLN
INTRAMUSCULAR | Status: AC
  Filled 2022-07-29: qty 2

## 2022-07-29 MED ORDER — PROPOFOL 500 MG/50ML IV EMUL
INTRAVENOUS | Status: DC | PRN
  Administered 2022-07-29: 180 ug/kg/min via INTRAVENOUS

## 2022-07-29 MED ORDER — LIDOCAINE HCL (CARDIAC) PF 100 MG/5ML IV SOSY
PREFILLED_SYRINGE | INTRAVENOUS | Status: DC | PRN
  Administered 2022-07-29: 40 mg via INTRATRACHEAL

## 2022-07-29 MED ORDER — EPHEDRINE SULFATE (PRESSORS) 50 MG/ML IJ SOLN
INTRAMUSCULAR | Status: DC | PRN
  Administered 2022-07-29 (×2): 5 mg via INTRAVENOUS

## 2022-07-29 MED ORDER — PROPOFOL 10 MG/ML IV BOLUS
INTRAVENOUS | Status: DC | PRN
  Administered 2022-07-29: 140 mg via INTRAVENOUS

## 2022-07-29 MED ORDER — LACTATED RINGERS IV SOLN: INTRAVENOUS | Status: DC

## 2022-07-29 MED ORDER — HYDROCODONE-ACETAMINOPHEN 10-325 MG PO TABS: 0.5000 | ORAL_TABLET | Freq: Four times a day (QID) | ORAL | 0 refills | Status: AC | PRN

## 2022-07-29 SURGICAL SUPPLY — 12 items
BAG DRAIN URO-CYSTO SKYTR STRL (DRAIN) ×3 IMPLANT
BAG DRN UROCATH (DRAIN) ×2
CLOTH BEACON ORANGE TIMEOUT ST (SAFETY) ×3 IMPLANT
GLOVE BIO SURGEON STRL SZ7.5 (GLOVE) ×3 IMPLANT
GOWN STRL REUS W/TWL LRG LVL3 (GOWN DISPOSABLE) ×3 IMPLANT
GUIDEWIRE STR DUAL SENSOR (WIRE) ×2 IMPLANT
IV NS IRRIG 3000ML ARTHROMATIC (IV SOLUTION) ×3 IMPLANT
KIT TURNOVER CYSTO (KITS) ×3 IMPLANT
MANIFOLD NEPTUNE II (INSTRUMENTS) ×3 IMPLANT
PACK CYSTO (CUSTOM PROCEDURE TRAY) ×3 IMPLANT
SYR 10ML LL (SYRINGE) ×3 IMPLANT
TUBE CONNECTING 12X1/4 (SUCTIONS) ×1 IMPLANT

## 2022-07-29 NOTE — Brief Op Note (Signed)
07/29/2022  2:15 PM  PATIENT:  Carrie Newton  77 y.o. female  PRE-OPERATIVE DIAGNOSIS:  RETAINED LEFT URETERAL STENT  POST-OPERATIVE DIAGNOSIS:  * No post-op diagnosis entered *  PROCEDURE:  Procedure(s) with comments: CYSTOSCOPY WITH STENT REMOVAL (Left) - 45 MINS  SURGEON:  Surgeon(s) and Role:    * Alexis Frock, MD - Primary  PHYSICIAN ASSISTANT:   ASSISTANTS: none   ANESTHESIA:   general  EBL:  minimal   BLOOD ADMINISTERED:none  DRAINS: none   LOCAL MEDICATIONS USED:  NONE  SPECIMEN:  Source of Specimen:  left ureteral stent  DISPOSITION OF SPECIMEN:   discard  COUNTS:  YES  TOURNIQUET:  * No tourniquets in log *  DICTATION: .Other Dictation: Dictation Number [id number]  PLAN OF CARE: Discharge to home after PACU  PATIENT DISPOSITION:  PACU - hemodynamically stable.   Delay start of Pharmacological VTE agent (>24hrs) due to surgical blood loss or risk of bleeding: not applicable

## 2022-07-29 NOTE — Anesthesia Preprocedure Evaluation (Signed)
Anesthesia Evaluation  Patient identified by MRN, date of birth, ID band Patient awake    Reviewed: Allergy & Precautions, NPO status , Patient's Chart, lab work & pertinent test results  History of Anesthesia Complications (+) PONV and history of anesthetic complications  Airway Mallampati: II  TM Distance: >3 FB Neck ROM: Full    Dental no notable dental hx. (+) Teeth Intact, Dental Advisory Given   Pulmonary    Pulmonary exam normal breath sounds clear to auscultation       Cardiovascular hypertension, Normal cardiovascular exam Rhythm:Regular Rate:Normal     Neuro/Psych   Anxiety        GI/Hepatic   Endo/Other  diabetes    Renal/GU Lab Results      Component                Value               Date                      CREATININE               0.88                07/28/2022                BUN                      13                  07/28/2022                NA                       138                 07/28/2022                K                        4.6                 07/28/2022                CL                       105                 07/28/2022                CO2                      26                  07/28/2022           \     Musculoskeletal  (+) Arthritis ,    Abdominal   Peds  Hematology Lab Results      Component                Value               Date                      WBC  9.4                 07/28/2022                HGB                      10.8 (L)            07/28/2022                HCT                      34.6 (L)            07/28/2022                MCV                      93.8                07/28/2022                PLT                      274                 07/28/2022              Anesthesia Other Findings   Reproductive/Obstetrics                             Anesthesia Physical Anesthesia Plan  ASA: 3  Anesthesia Plan:  General   Post-op Pain Management: Ofirmev IV (intra-op)*   Induction: Intravenous  PONV Risk Score and Plan: TIVA, Propofol infusion and Treatment may vary due to age or medical condition  Airway Management Planned: LMA  Additional Equipment: None  Intra-op Plan:   Post-operative Plan:   Informed Consent: I have reviewed the patients History and Physical, chart, labs and discussed the procedure including the risks, benefits and alternatives for the proposed anesthesia with the patient or authorized representative who has indicated his/her understanding and acceptance.     Dental advisory given  Plan Discussed with: CRNA, Anesthesiologist and Surgeon  Anesthesia Plan Comments: (TIVA GA)       Anesthesia Quick Evaluation

## 2022-07-29 NOTE — Progress Notes (Signed)
Informed the patient that her sugery is being moved to [facility] surgery center.  She plans to arrive there around 1145 am.  When seh leaves home her Son Patrick Jupiter will be the best contact number at [place] BSN, RN RN Speciality Coordinator - Perioperative Services [facility] [phone]

## 2022-07-29 NOTE — Discharge Instructions (Addendum)
1 - You may have urinary urgency (bladder spasms) and bloody urine on / off x few days. This is normal.  2 - Call MD or go to ER for fever >102, severe pain / nausea / vomiting not relieved by medications, or acute change in medical status   Post Anesthesia Home Care Instructions  Activity: Get plenty of rest for the remainder of the day. A responsible adult should stay with you for 24 hours following the procedure.  For the next 24 hours, DO NOT: -Drive a car -Paediatric nurse -Drink alcoholic beverages -Take any medication unless instructed by your physician -Make any legal decisions or sign important papers.  Meals: Start with liquid foods such as gelatin or soup. Progress to regular foods as tolerated. Avoid greasy, spicy, heavy foods. If nausea and/or vomiting occur, drink only clear liquids until the nausea and/or vomiting subsides. Call your physician if vomiting continues.  Special Instructions/Symptoms: Your throat may feel dry or sore from the anesthesia or the breathing tube placed in your throat during surgery. If this causes discomfort, gargle with warm salt water. The discomfort should disappear within 24 hours.      CYSTOSCOPY HOME CARE INSTRUCTIONS  Activity: Rest for the remainder of the day.  Do not drive or operate equipment today.  You may resume normal activities in one to two days as instructed by your physician.   Meals: Drink plenty of liquids and eat light foods such as gelatin or soup this evening.  You may return to a normal meal plan tomorrow.  Return to Work: You may return to work in one to two days or as instructed by your physician.  Special Instructions / Symptoms: Call your physician if any of these symptoms occur:   -persistent or heavy bleeding  -bleeding which continues after first few urination  -large blood clots that are difficult to pass  -urine stream diminishes or stops completely  -fever equal to or higher than 101 degrees  Farenheit.  -cloudy urine with a strong, foul odor  -severe pain  Females should always wipe from front to back after elimination.  You may feel some burning pain when you urinate.  This should disappear with time.  Applying moist heat to the lower abdomen or a hot tub bath may help relieve the pain. \  Follow-Up / Date of Return Visit to Your Physician:   Call for an appointment to arrange follow-up.

## 2022-07-29 NOTE — Anesthesia Postprocedure Evaluation (Signed)
Anesthesia Post Note  Patient: Carrie Newton  Procedure(s) Performed: CYSTOSCOPY WITH STENT REMOVAL (Left)     Patient location during evaluation: PACU Anesthesia Type: General Level of consciousness: awake and alert Pain management: pain level controlled Vital Signs Assessment: post-procedure vital signs reviewed and stable Respiratory status: spontaneous breathing, nonlabored ventilation, respiratory function stable and patient connected to nasal cannula oxygen Cardiovascular status: blood pressure returned to baseline and stable Postop Assessment: no apparent nausea or vomiting Anesthetic complications: no  No notable events documented.  Last Vitals:  Vitals:   07/29/22 1445 07/29/22 1500  BP: (!) 115/49 (!) 113/45  Pulse: 61 (!) 58  Resp: 17 20  Temp:  36.6 C  SpO2: 96% 99%    Last Pain:  Vitals:   07/29/22 1500  TempSrc:   PainSc: 0-No pain                 Barnet Glasgow

## 2022-07-29 NOTE — Transfer of Care (Signed)
Immediate Anesthesia Transfer of Care Note  Patient: Carrie Newton  Procedure(s) Performed: CYSTOSCOPY WITH STENT REMOVAL (Left)  Patient Location: PACU  Anesthesia Type:General  Level of Consciousness: awake, alert , and oriented  Airway & Oxygen Therapy: Patient Spontanous Breathing  Post-op Assessment: Report given to RN and Post -op Vital signs reviewed and stable  Post vital signs: Reviewed and stable  Last Vitals:  Vitals Value Taken Time  BP 108/92 07/29/22 1434  Temp    Pulse 57 07/29/22 1435  Resp 18 07/29/22 1435  SpO2 97 % 07/29/22 1435  Vitals shown include unvalidated device data.  Last Pain:  Vitals:   07/29/22 1239  TempSrc: Oral  PainSc: 0-No pain      Patients Stated Pain Goal: 6 (08/26/77 8102)  Complications: No notable events documented.

## 2022-07-29 NOTE — H&P (Signed)
Carrie Newton is an 77 y.o. female.    Chief Complaint:  Pre-OP LEFT Ureteral stent pull / diagnostic ureteroscopy  HPI:   1 - Gross Hematuria, Irritative Voiding - on / off gross hematurai x few mos 2021. UCX negative. She is on eliquus for AFib. Non smoker. NO chronic solvent exposure.   2 - Left Renal Stone -s/p 2 stage left ureteroscopy to stone free for 2.2 cm left renal stone that had progressed on surveillance and caused intermitant hematuira.   PMH sig for morbid obesity, AFib/Eliquus (follows J. 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 Her PCP is Mayra Neer MD with [facility].   Today " Carrie Newton" is seen for cysto stent pull / possible ureteroscopic stent retrieval for retained left ureteral stent that had migrated proximally and was not amenable to office retrieval. No interval fevers. Most recent UA without sig infectious parameters.   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 Wyatt Portela 06-20-2021   PAF (paroxysmal atrial fibrillation) ([place])    PONV (postoperative nausea and vomiting)    Vision changes 12/2021   double vision    Past Surgical History:  Procedure Laterality Date   BIOPSY  04/05/2019   Procedure: BIOPSY;  Surgeon: Wonda Horner, MD;  Location: [facility] ENDOSCOPY;  Service: Endoscopy;;   BREAST LUMPECTOMY  07/06/1990   Left breast   CARPAL TUNNEL RELEASE     Left hand 05/2012 and  right hand dec 2013   COLONOSCOPY WITH PROPOFOL N/A 04/05/2019   Procedure: COLONOSCOPY WITH PROPOFOL;  Surgeon: Wonda Horner, MD;  Location: [facility] ENDOSCOPY;  Service: Endoscopy;  Laterality: N/A;   CYSTOSCOPY WITH RETROGRADE PYELOGRAM, URETEROSCOPY AND STENT PLACEMENT Left 06/03/2022   Procedure: CYSTOSCOPY WITH RETROGRADE PYELOGRAM, URETEROSCOPY AND STENT PLACEMENT FIRST STAGE;  Surgeon: Alexis Frock, MD;  Location: [facility] ORS;  Service: Urology;  Laterality: Left;  66 MINS   CYSTOSCOPY WITH RETROGRADE PYELOGRAM, URETEROSCOPY AND STENT PLACEMENT Left 06/24/2022   Procedure: SECOND STAGE CYSTOSCOPY WITH RETROGRADE PYELOGRAM, URETEROSCOPY AND STENT EXCHANGE AND LASER;  Surgeon: Alexis Frock, MD;  Location: [facility] ORS;  Service: Urology;  Laterality: Left;  75 MINS   ESOPHAGOGASTRODUODENOSCOPY (EGD) WITH PROPOFOL N/A 04/05/2019   Procedure: ESOPHAGOGASTRODUODENOSCOPY (EGD) WITH PROPOFOL;  Surgeon: Wonda Horner, MD;  Location: [facility] ENDOSCOPY;  Service: Endoscopy;  Laterality: N/A;   HOLMIUM LASER APPLICATION Left 62/83/6629   Procedure: HOLMIUM LASER APPLICATION;  Surgeon: Alexis Frock, MD;  Location: [facility] ORS;  Service: Urology;  Laterality: Left;   HOLMIUM LASER APPLICATION Left 47/65/4650   Procedure: HOLMIUM LASER APPLICATION;  Surgeon: Alexis Frock, MD;  Location: [facility] ORS;  Service: Urology;  Laterality: Left;   KNEE ARTHROSCOPY Left 2011   TRIGGER FINGER RELEASE  07/06/2013   Left thumb    Family History  Problem Relation Age of Onset   Hypertension Father  Heart attack Father    Stroke Neg Hx    Social History:  reports that she has never smoked. She has never used smokeless tobacco. She reports that she does not drink alcohol and does not use drugs.  Allergies:  Allergies  Allergen Reactions   Metoprolol Tartrate Other (See Comments)    Junctional Bradycardia    Neosporin [Neomycin-Bacitracin Zn-Polymyx] Rash   Norvasc [Amlodipine Besylate] Rash   Percodan  [Oxycodone-Aspirin] Other (See Comments)    Tingling     No medications prior to admission.    Results for orders placed or performed during the hospital encounter of 07/28/22 (from the past 48 hour(s))  Glucose, capillary     Status: Abnormal   Collection Time: 07/28/22  9:48 AM  Result Value Ref Range   Glucose-Capillary 126 (H) 70 - 99 mg/dL    Comment: Glucose reference range applies only to samples taken after fasting for at least 8 hours.  Hemoglobin A1c per protocol     Status: Abnormal   Collection Time: 07/28/22 10:00 AM  Result Value Ref Range   Hgb A1c MFr Bld 7.1 (H) 4.8 - 5.6 %    Comment: (NOTE) Pre diabetes:          5.7%-6.4%  Diabetes:              >6.4%  Glycemic control for   <7.0% adults with diabetes    Mean Plasma Glucose 157.07 mg/dL    Comment: Performed at [facility] [address], [place], [place] [address]  Basic metabolic panel per protocol     Status: Abnormal   Collection Time: 07/28/22 10:00 AM  Result Value Ref Range   Sodium 138 135 - 145 mmol/L   Potassium 4.6 3.5 - 5.1 mmol/L   Chloride 105 98 - 111 mmol/L   CO2 26 22 - 32 mmol/L   Glucose, Bld 129 (H) 70 - 99 mg/dL    Comment: Glucose reference range applies only to samples taken after fasting for at least 8 hours.   BUN 13 8 - 23 mg/dL   Creatinine, Ser 0.88 0.44 - 1.00 mg/dL   Calcium 8.5 (L) 8.9 - 10.3 mg/dL   GFR, Estimated >60 >60 mL/min    Comment: (NOTE) Calculated using the CKD-EPI Creatinine Equation (2021)    Anion gap 7 5 - 15    Comment: Performed at [facility], [place] [address], [place], [place] [address]  CBC per protocol     Status: Abnormal   Collection Time: 07/28/22 10:00 AM  Result Value Ref Range   WBC 9.4 4.0 - 10.5 K/uL   RBC 3.69 (L) 3.87 - 5.11 MIL/uL   Hemoglobin 10.8 (L) 12.0 - 15.0 g/dL   HCT 34.6 (L) 36.0 - 46.0 %   MCV 93.8 80.0 - 100.0 fL   MCH 29.3 26.0 - 34.0 pg   MCHC 31.2 30.0 - 36.0 g/dL   RDW 14.6 11.5 - 15.5 %    Platelets 274 150 - 400 K/uL   nRBC 0.0 0.0 - 0.2 %    Comment: Performed at [facility], [place] [address], [place], [place] [address]   No results found.  Review of Systems  Constitutional: Negative.  Negative for fever.  Genitourinary:  Positive for hematuria.  All other systems reviewed and are negative.   There were no vitals taken for this visit. Physical Exam Vitals reviewed.  Constitutional:      Comments: Extremely pleasant, at  baseline.   Eyes:     Pupils: Pupils are equal, round, and reactive to light.  Cardiovascular:     Rate and Rhythm: Normal rate.  Abdominal:     General: Abdomen is flat.     Comments: Stable large truncal obesity.   Genitourinary:    Comments: No CVAT at present Musculoskeletal:        General: Normal range of motion.  Skin:    General: Skin is warm.  Neurological:     General: No focal deficit present.     Mental Status: She is alert.  Psychiatric:        Mood and Affect: Mood normal.      Assessment/Plan  Proceed as planned with cysto, and left stent retrieval. Risks, benefits, alternatives, expected peri-op course discussed previously and reiterated today.   Alexis Frock, MD 07/29/2022, 7:09 AM

## 2022-07-29 NOTE — Op Note (Signed)
Carrie Newton, Carrie Newton MEDICAL RECORD NO: [id number] ACCOUNT NO: [id number] DATE OF BIRTH: 1946-04-06 FACILITY: [facility] LOCATION: [facility] PHYSICIAN: Alexis Frock, MD  Operative Report   DATE OF PROCEDURE: 07/29/2022  PREOPERATIVE DIAGNOSIS:  Retained left ureteral stent with proximal migration.  POSTOPERATIVE DIAGNOSIS:  Retained left ureteral stent with proximal migration.  PROCEDURE PERFORMED:  Cystoscopy with left ureteral stent pull.  ESTIMATED BLOOD LOSS:  Nil.  COMPLICATIONS:  None.  SPECIMEN:  Left ureteral stent for discard.  FINDINGS: 1.  Proximally migrated ureteral stent distal in intramural ureter. 2.  Complete removal of the prior stent, inspected and intact for discard.  INDICATIONS:  The patient is a very, very pleasant, but somewhat comorbid 77 year old lady with history of a large left renal stone that had grown on surveillance becoming more symptomatic.  She underwent a multistage ureteroscopy recently to stone free,  which she tolerated very well despite her comorbidity.  She was seen in the office recently for cystoscopy and stent removal.  However, her stent had migrated proximally such that the distal end was not within the urinary bladder and not amenable to  office retrieval.  Options were discussed for management including recommended path of operative removal as possible ureteroscopy may be necessary in the proximal migration.  She presents for this today.  Informed consent was obtained and placed in  medical record.  PROCEDURE IN DETAIL:  The patient being identified and verified, procedure being cystoscopy and left ureteral stent pull possible ureteroscopy was confirmed.  Procedure timeout was performed.  Intravenous antibiotics were administered.  General LMA  anesthesia induced.  The patient was placed in a low lithotomy position.  Sterile field was created, prepped and draped the patient's vagina, introitus, and proximal thighs using iodine.   Cystourethroscopy was performed using 21-French rigid cystoscope  with off-set lens.  Inspection of urinary bladder revealed some mucosal edema around the left ureteral orifice as anticipated.  Spot fluoroscopic imaging corroborated the stent in the very distal ureter just in the intramural ureter. The stent grasper  was then very carefully advanced under fluoroscopic vision in the level of the intramural ureter and using fluoroscopic guidance the stent was snared with this brought out in its entirety, inspected and intact and discarded.  Bladder was empty per  cystoscope.  Procedure was then terminated.  The patient tolerated the procedure well, no immediate periprocedural complications.  The patient taken to postanesthesia care unit in stable condition.  Plan for discharge home.   PUS D: 07/29/2022 2:19:11 pm T: 07/29/2022 4:24:00 pm  JOB: [id number]/ [id number]

## 2022-07-29 NOTE — Anesthesia Procedure Notes (Signed)
Procedure Name: LMA Insertion Date/Time: 07/29/2022 2:07 PM  Performed by: Clearnce Sorrel, CRNAPre-anesthesia Checklist: Patient identified, Emergency Drugs available, Suction available and Patient being monitored Patient Re-evaluated:Patient Re-evaluated prior to induction Oxygen Delivery Method: Circle System Utilized Preoxygenation: Pre-oxygenation with 100% oxygen Induction Type: IV induction Ventilation: Mask ventilation without difficulty LMA: LMA inserted LMA Size: 4.0 Number of attempts: 1 Airway Equipment and Method: Bite block Placement Confirmation: positive ETCO2 Tube secured with: Tape Dental Injury: Teeth and Oropharynx as per pre-operative assessment

## 2022-07-30 ENCOUNTER — Encounter (HOSPITAL_BASED_OUTPATIENT_CLINIC_OR_DEPARTMENT_OTHER): Payer: Self-pay | Admitting: Urology

## 2022-08-07 ENCOUNTER — Other Ambulatory Visit: Payer: Self-pay | Admitting: Internal Medicine

## 2022-08-07 DIAGNOSIS — M810 Age-related osteoporosis without current pathological fracture: Secondary | ICD-10-CM

## 2022-09-01 ENCOUNTER — Ambulatory Visit (HOSPITAL_COMMUNITY): Payer: Medicare Other | Admitting: Physician Assistant

## 2022-09-03 ENCOUNTER — Ambulatory Visit (HOSPITAL_COMMUNITY): Payer: Medicare Other | Admitting: Physician Assistant

## 2022-09-18 ENCOUNTER — Other Ambulatory Visit: Payer: Self-pay | Admitting: Interventional Cardiology

## 2022-09-18 NOTE — Telephone Encounter (Signed)
Prescription refill request for Eliquis received. Indication: afib  Last office visit: Mealor, 11/07/11/2021 Scr: 0.88, 07/28/2022 Age: 77 yo  Weight: 110 kg   Refill sent.

## 2022-10-05 ENCOUNTER — Ambulatory Visit (HOSPITAL_COMMUNITY): Payer: Medicare Other | Admitting: Physician Assistant

## 2022-10-13 ENCOUNTER — Ambulatory Visit (HOSPITAL_COMMUNITY)
Admission: RE | Admit: 2022-10-13 | Discharge: 2022-10-13 | Disposition: A | Discharge status: Home / Self Care | Payer: Medicare Other | Source: Ambulatory Visit | Attending: Physician Assistant | Admitting: Physician Assistant

## 2022-10-13 ENCOUNTER — Encounter (HOSPITAL_COMMUNITY): Payer: Self-pay | Admitting: Physician Assistant

## 2022-10-13 VITALS — BP 126/70 | HR 70 | Ht 63.0 in | Wt 243.0 lb

## 2022-10-13 DIAGNOSIS — E669 Obesity, unspecified: Secondary | ICD-10-CM | POA: Insufficient documentation

## 2022-10-13 DIAGNOSIS — Z7901 Long term (current) use of anticoagulants: Secondary | ICD-10-CM | POA: Diagnosis not present

## 2022-10-13 DIAGNOSIS — D6869 Other thrombophilia: Secondary | ICD-10-CM

## 2022-10-13 DIAGNOSIS — Z6841 Body Mass Index (BMI) 40.0 and over, adult: Secondary | ICD-10-CM | POA: Diagnosis not present

## 2022-10-13 DIAGNOSIS — I4819 Other persistent atrial fibrillation: Secondary | ICD-10-CM

## 2022-10-13 DIAGNOSIS — I10 Essential (primary) hypertension: Secondary | ICD-10-CM | POA: Insufficient documentation

## 2022-10-13 DIAGNOSIS — E119 Type 2 diabetes mellitus without complications: Secondary | ICD-10-CM | POA: Insufficient documentation

## 2022-10-13 NOTE — Progress Notes (Signed)
Primary Care Physician: Lupita RaiderShaw, Kimberlee, MD Primary Cardiologist: Dr Eldridge DaceVaranasi  Primary Electrophysiologist: Dr Nelly LaurenceMealor Referring Physician: Dr Burna MortimerVaranasi    Carrie Newton is a 77 y.o. female with a history of DM, HTN, atrial fibrillation who [facility].  The patient was initially diagnosed with atrial fibrillation remotely. Patient is on Eliquis for a CHADS2VASC score of 5. She was started on amiodarone on 10/29/21 with plan for DCCV. She chemically converted to SR.   Patient seen by Dr Eldridge DaceVaranasi 04/2022 and found to be in a junctional rhythm, amiodarone was decreased further and eventually discontinued. She was seen by Dr Nelly LaurenceMealor who recommended dofetilide loading. Unfortunately, she has needed several urologic procedures for a large kidney stone/hematuria which delayed her admission. This is followed by Dr Berneice HeinrichManny.   On follow up today, patient reports that she has remained in SR since stopping amiodarone. She occasionally has a small amount of blood in her urine, she plans to discuss with Dr Berneice HeinrichManny.   Today, she denies symptoms of palpitations, chest pain, shortness of breath, orthopnea, PND, lower extremity edema, dizziness, presyncope, syncope, snoring, daytime somnolence, bleeding, or neurologic sequela. The patient is tolerating medications without difficulties and is otherwise without complaint today.    Atrial Fibrillation Risk Factors:  she does not have symptoms or diagnosis of sleep apnea. she does not have a history of rheumatic fever.   she has a BMI of Body mass index is 43.05 kg/m.Marland Kitchen. Filed Weights   10/13/22 1327  Weight: 110.2 kg    Family History  Problem Relation Age of Onset   Hypertension Father    Heart attack Father    Stroke Neg Hx      Atrial Fibrillation Management history:  Previous antiarrhythmic drugs: amiodarone  Previous cardioversions: none Previous ablations: none CHADS2VASC score:  5 Anticoagulation history: Eliquis   Past Medical History:  Diagnosis Date   Anemia    Anxiety    Arthritis    Brain tumor (benign) 12/25/2021   meningioma, has Double vision, uses special glasses   Chronic kidney disease    Complication of anesthesia    post op nausea and vomiting   Diabetes    Type 2   History of echocardiogram 10/2012   Echo 4/14:  EF 60-65%, mild LAE, MAC, impaired relaxation   History of kidney stones    Hypertension    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 Fabian SharpScott Groat 06-20-2021   PAF (paroxysmal atrial fibrillation)    PONV (postoperative nausea and vomiting)    Vision changes 12/2021   double vision   Past Surgical History:  Procedure Laterality Date   BIOPSY  04/05/2019   Procedure: BIOPSY;  Surgeon: Graylin ShiverGanem, Salem F, MD;  [facility];  Service: Endoscopy;;   BREAST LUMPECTOMY  07/06/1990   Left breast   CARPAL TUNNEL RELEASE     Left hand 05/2012 and right hand dec 2013   COLONOSCOPY WITH PROPOFOL N/A 04/05/2019   Procedure: COLONOSCOPY WITH PROPOFOL;  Surgeon: Graylin ShiverGanem, Salem F, MD;  [facility];  Service: Endoscopy;  Laterality: N/A;   CYSTOSCOPY W/ URETERAL STENT REMOVAL Left 07/29/2022   Procedure: CYSTOSCOPY WITH STENT REMOVAL;  Surgeon: Sebastian AcheManny, Theodore, MD;  [facility] [place];  Service: Urology;  Laterality: Left;   CYSTOSCOPY WITH RETROGRADE PYELOGRAM, URETEROSCOPY AND STENT PLACEMENT Left 06/03/2022   Procedure: CYSTOSCOPY WITH  RETROGRADE PYELOGRAM, URETEROSCOPY AND STENT PLACEMENT FIRST STAGE;  Surgeon: Sebastian Ache, MD;  Location: [facility];  Service: Urology;  Laterality: Left;  90 MINS   CYSTOSCOPY WITH RETROGRADE PYELOGRAM, URETEROSCOPY AND STENT PLACEMENT Left 06/24/2022   Procedure: SECOND STAGE CYSTOSCOPY WITH RETROGRADE PYELOGRAM, URETEROSCOPY AND STENT EXCHANGE AND LASER;  Surgeon: Sebastian Ache, MD;   Location: [facility];  Service: Urology;  Laterality: Left;  75 MINS   ESOPHAGOGASTRODUODENOSCOPY (EGD) WITH PROPOFOL N/A 04/05/2019   Procedure: ESOPHAGOGASTRODUODENOSCOPY (EGD) WITH PROPOFOL;  Surgeon: Graylin Shiver, MD;  Location: [facility];  Service: Endoscopy;  Laterality: N/A;   HOLMIUM LASER APPLICATION Left 06/03/2022   Procedure: HOLMIUM LASER APPLICATION;  Surgeon: Sebastian Ache, MD;  Location: [facility];  Service: Urology;  Laterality: Left;   HOLMIUM LASER APPLICATION Left 06/24/2022   Procedure: HOLMIUM LASER APPLICATION;  Surgeon: Sebastian Ache, MD;  Location: [facility];  Service: Urology;  Laterality: Left;   KNEE ARTHROSCOPY Left 2011   TRIGGER FINGER RELEASE  07/06/2013   Left thumb    Current Outpatient Medications  Medication Sig Dispense Refill   ACCU-CHEK AVIVA PLUS test strip 1 each by Other route as needed for other (SUGAR CHECK).      alendronate (FOSAMAX) 70 MG tablet Take 70 mg by mouth once a week.     apixaban (ELIQUIS) 5 MG TABS tablet TAKE 1 TABLET BY MOUTH TWICE  DAILY 180 tablet 1   atorvastatin (LIPITOR) 20 MG tablet Take 20 mg by mouth daily at 6 PM.      buPROPion (WELLBUTRIN XL) 300 MG 24 hr tablet Take 300 mg by mouth daily.     Calcium-Phosphorus-Vitamin D (CITRACAL +D3 PO) Take 1 tablet by mouth daily.     cyclobenzaprine (FLEXERIL) 10 MG tablet Take 10 mg by mouth 3 (three) times daily as needed for muscle spasms.     diclofenac Sodium (VOLTAREN) 1 % GEL Apply 2 g topically daily as needed (pain).     felodipine (PLENDIL) 10 MG 24 hr tablet Take 10 mg by mouth at bedtime.     furosemide (LASIX) 40 MG tablet Take 1 tablet (40 mg total) by mouth daily as needed for fluid or edema (if you gain 3 pounds over night or 5 pounds in a week). 45 tablet 3   HYDROcodone-acetaminophen (NORCO) 10-325 MG tablet Take 0.5-1 tablets by mouth every 6 (six) hours as needed for severe pain. Post-operatively 10 tablet 0   Insulin Degludec (TRESIBA) 100 UNIT/ML SOLN Inject  36-52 Units into the skin daily. Per sliding scale     insulin lispro (HUMALOG) 100 UNIT/ML injection Inject 6-10 Units into the skin as needed for high blood sugar.     levothyroxine (SYNTHROID) 88 MCG tablet Take 88 mcg by mouth every other day. Alternate with 75 mcg     levothyroxine (SYNTHROID, LEVOTHROID) 75 MCG tablet Take 75 mcg by mouth every other day. Alternate with 88 mcg tab     losartan (COZAAR) 25 MG tablet Take by mouth.     Multiple Vitamin (MULTIVITAMIN) tablet Take 1 tablet by mouth daily.     mupirocin ointment (BACTROBAN) 2 % Apply 1 application  topically daily as needed (wound care).     pantoprazole (PROTONIX) 40 MG tablet Take 40 mg by mouth at bedtime.     Semaglutide, 2 MG/DOSE, (OZEMPIC, 2 MG/DOSE,) 8 MG/3ML SOPN Inject 2 mg into the skin once a week.     sertraline (ZOLOFT) 100 MG tablet Take 100 mg by mouth  at bedtime.     sulfamethoxazole-trimethoprim (BACTRIM DS) 800-160 MG tablet Take 1 tablet by mouth 2 (two) times daily. Begin day before next Urology appointment 4 tablet 0   vitamin C (ASCORBIC ACID) 500 MG tablet Take 500 mg by mouth daily.     No current facility-administered medications for this encounter.    Allergies  Allergen Reactions   Metoprolol Tartrate Other (See Comments)    Junctional Bradycardia    Neosporin [Neomycin-Bacitracin Zn-Polymyx] Rash   Norvasc [Amlodipine Besylate] Rash   Percodan [Oxycodone-Aspirin] Other (See Comments)    Tingling     Social History   Socioeconomic History   Marital status: Divorced    Spouse name: Not on file   Number of children: Not on file   Years of education: Not on file   Highest education level: Not on file  Occupational History   Not on file  Tobacco Use   Smoking status: Never   Smokeless tobacco: Never   Tobacco comments:    Never smoke 03/05/22  Vaping Use   Vaping Use: Never used  Substance and Sexual Activity   Alcohol use: No    Alcohol/week: 0.0 standard drinks of alcohol    Drug use: No   Sexual activity: Not Currently  Other Topics Concern   Not on file  Social History Narrative   Not on file   Social Determinants of Health   Financial Resource Strain: Not on file  Food Insecurity: Not on file  Transportation Needs: Not on file  Physical Activity: Not on file  Stress: Not on file  Social Connections: Not on file  Intimate Partner Violence: Not on file     ROS- All systems are reviewed and negative except as per the HPI above.  Physical Exam: Vitals:   10/13/22 1327  BP: 126/70  Pulse: 70  Weight: 110.2 kg  Height: 5\' 3"  (1.6 m)    GEN- The patient is a well appearing female, alert and oriented x 3 today.   HEENT-head normocephalic, atraumatic, sclera clear, conjunctiva pink, hearing intact, trachea midline. Lungs- Clear to ausculation bilaterally, normal work of breathing Heart- Regular rate and rhythm, no murmurs, rubs or gallops  GI- soft, NT, ND, + BS Extremities- no clubbing, cyanosis, or edema MS- no significant deformity or atrophy Skin- no rash or lesion Psych- euthymic mood, full affect Neuro- strength and sensation are intact   Wt Readings from Last 3 Encounters:  10/13/22 110.2 kg  07/29/22 110.6 kg  07/28/22 110.2 kg    EKG today demonstrates  SR Vent. rate 70 BPM PR interval 166 ms QRS duration 88 ms QT/QTcB 406/438 ms  Echo 05/19/21 demonstrated  1. Left ventricular ejection fraction, by estimation, is 60 to 65%. Left  ventricular ejection fraction by 3D volume is 62 %. The left ventricle has normal function. The left ventricle has no regional wall motion  abnormalities. Left ventricular diastolic  parameters are consistent with Grade I diastolic dysfunction (impaired relaxation).   2. Right ventricular systolic function is normal. The right ventricular  size is normal. There is normal pulmonary artery systolic pressure. The  estimated right ventricular systolic pressure is 19.8 mmHg.   3. Left atrial size was  moderately dilated.   4. The mitral valve is abnormal. Trivial mitral valve regurgitation.  Moderate mitral annular calcification.   5. The aortic valve is tricuspid. Aortic valve regurgitation is not  visualized.   6. The inferior vena cava is normal in size with greater than 50%  respiratory variability, suggesting right atrial pressure of 3 mmHg.   Epic records are reviewed at length today  CHA2DS2-VASc Score = 5  The patient's score is based upon: CHF History: 0 HTN History: 1 Diabetes History: 1 Stroke History: 0 Vascular Disease History: 0 Age Score: 2 Gender Score: 1       ASSESSMENT AND PLAN: 1. Persistent Atrial Fibrillation (ICD10:  I48.19) The patient's CHA2DS2-VASc score is 5, indicating a 7.2% annual risk of stroke.   Off amiodarone due to bradycardia/junctional rhythm.  We discussed rhythm control options including dofetilide, informational sheet provided today. Since she has stayed in SR, she would like to pursue watchful waiting for now. She has a Optician, dispensing mobile device to monitor from home.  Continue Eliquis 5 mg BID  2. Secondary Hypercoagulable State (ICD10:  D68.69) The patient is at significant risk for stroke/thromboembolism based upon her CHA2DS2-VASc Score of 5.  Continue Apixaban (Eliquis).   3. Obesity Body mass index is 43.05 kg/m. Lifestyle modification was discussed and encouraged including regular physical activity and weight reduction.  4. HTN Stable, no changes today.   Follow up in the AF clinic in 3 months, sooner if dofetilide is needed.    Jorja Loa PA-C [facility] [address] [place], [place] [address] [phone] 10/13/2022 1:35 PM

## 2022-10-15 ENCOUNTER — Encounter (INDEPENDENT_AMBULATORY_CARE_PROVIDER_SITE_OTHER): Payer: Medicare Other | Admitting: Ophthalmology

## 2022-10-19 LAB — LAB REPORT - SCANNED: A1c: 7

## 2022-11-01 NOTE — Progress Notes (Signed)
Cardiology Office Note   Date:  11/02/2022   ID:  Carrie Newton, DOB 04/15/1946, MRN [id number]  PCP:  Lupita Raider, MD    No chief complaint on file.  AFib  Wt Readings from Last 3 Encounters:  11/02/22 237 lb (107.5 kg)  10/13/22 243 lb (110.2 kg)  07/29/22 243 lb 14.4 oz (110.6 kg)       History of Present Illness: Carrie Newton is a 77 y.o. female  Who has had PAF diagnosed several years ago.  EF 60-65% in 2014.   LA dimension of 4.7 cm.     She was treated with Xarelto for stroke prevention.  In November and December 2016, she experienced episodic nose bleeding. She was seen in the office. She was sent to ENT. There was a scab found on the bleeding area in her nose.   She has had some issues with hypoglycemia.   EGD/Colonoscopy in 2020 with Dr. Evette Cristal- no ulcers, no polyps.   In 2021, she had a kidney stone.  She had some hematuria in 3/21.  Has a kidney stone that hsa grown per her report in 2022. Has occasional flank pain.    She felt higher heart rates in April 2023.  Amiodarone was started and she chemically converted to sinus rhythm.   FOund to have a meningioma in June 2023- she had double vision.  Stable size in 9/23.  Will have a repeat in 1 year.  Managing vision with glasses.     Seen by atrial fibrillation clinic in August 2023.  She was in sinus rhythm on low-dose amiodarone.  Amio eventually stopped for bradycardia and Tikosyn was recommended.   Had vision problems.  Improved, but still not driving.   Arthritis pain limits walking.  Has lost weight. Over 2023-2024.   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 Fabian Sharp 06-20-2021   PAF (paroxysmal atrial fibrillation) (HCC)    PONV (postoperative nausea and vomiting)    Vision changes 12/2021   double vision    Past Surgical History:  Procedure Laterality Date   BIOPSY  04/05/2019   Procedure: BIOPSY;  Surgeon: Graylin Shiver, MD;  Location: [facility] ENDOSCOPY;  Service: Endoscopy;;   BREAST LUMPECTOMY  07/06/1990   Left breast   CARPAL TUNNEL RELEASE     Left hand 05/2012 and right hand dec 2013   COLONOSCOPY WITH PROPOFOL N/A 04/05/2019   Procedure: COLONOSCOPY WITH PROPOFOL;  Surgeon: Graylin Shiver, MD;  Location: [facility] ENDOSCOPY;  Service: Endoscopy;  Laterality: N/A;   CYSTOSCOPY W/ URETERAL STENT REMOVAL Left 07/29/2022   Procedure: CYSTOSCOPY WITH STENT REMOVAL;  Surgeon: Sebastian Ache, MD;  Location: [facility];  Service: Urology;  Laterality: Left;   CYSTOSCOPY WITH RETROGRADE PYELOGRAM, URETEROSCOPY AND STENT PLACEMENT Left 06/03/2022   Procedure: CYSTOSCOPY WITH RETROGRADE PYELOGRAM, URETEROSCOPY AND STENT PLACEMENT FIRST STAGE;  Surgeon: Sebastian Ache, MD;  Location: [facility] ORS;  Service: Urology;  Laterality: Left;  90 MINS   CYSTOSCOPY WITH RETROGRADE PYELOGRAM, URETEROSCOPY AND STENT PLACEMENT Left 06/24/2022   Procedure: SECOND STAGE CYSTOSCOPY WITH RETROGRADE PYELOGRAM, URETEROSCOPY AND STENT EXCHANGE AND LASER;  Surgeon: Sebastian Ache, MD;  Location: [facility];  Service: Urology;  Laterality: Left;  75 MINS   ESOPHAGOGASTRODUODENOSCOPY (EGD) WITH PROPOFOL N/A 04/05/2019   Procedure: ESOPHAGOGASTRODUODENOSCOPY (EGD) WITH PROPOFOL;  Surgeon: Graylin Shiver, MD;  Location: [facility] ENDOSCOPY;  Service: Endoscopy;  Laterality: N/A;   HOLMIUM LASER APPLICATION Left 06/03/2022   Procedure: HOLMIUM LASER APPLICATION;  Surgeon: Sebastian Ache, MD;  Location:  [facility];  Service: Urology;  Laterality: Left;   HOLMIUM LASER APPLICATION Left 06/24/2022   Procedure: HOLMIUM LASER APPLICATION;  Surgeon: Sebastian Ache, MD;  Location: [facility];  Service: Urology;  Laterality: Left;   KNEE ARTHROSCOPY Left 2011   TRIGGER FINGER RELEASE  07/06/2013   Left thumb     Current Outpatient Medications  Medication Sig Dispense Refill   ACCU-CHEK AVIVA PLUS test strip 1 each by Other route as needed for other (SUGAR CHECK).      alendronate (FOSAMAX) 70 MG tablet Take 70 mg by mouth once a week.     apixaban (ELIQUIS) 5 MG TABS tablet TAKE 1 TABLET BY MOUTH TWICE  DAILY 180 tablet 1   atorvastatin (LIPITOR) 20 MG tablet Take 20 mg by mouth daily at 6 PM.      buPROPion (WELLBUTRIN XL) 300 MG 24 hr tablet Take 300 mg by mouth daily.     calcium carbonate (SUPER CALCIUM) 1500 (600 Ca) MG TABS tablet daily at 6 (six) AM.     Calcium-Phosphorus-Vitamin D (CITRACAL +D3 PO) Take 1 tablet by mouth daily.     cyclobenzaprine (FLEXERIL) 10 MG tablet Take 10 mg by mouth 3 (three) times daily as needed for muscle spasms.     diclofenac Sodium (VOLTAREN) 1 % GEL Apply 2 g topically daily as needed (pain).     felodipine (PLENDIL) 10 MG 24 hr tablet Take 10 mg by mouth at bedtime.     Ferrous Sulfate (IRON) 325 (65 Fe) MG TABS every other day.     furosemide (LASIX) 40 MG tablet Take 1 tablet (40 mg total) by mouth daily as needed for fluid or edema (if you gain 3 pounds over night or 5 pounds in a week). 45 tablet 3   Glucagon (GVOKE HYPOPEN 2-PACK) 1 MG/0.2ML SOAJ as needed (low blood sugar).     HYDROcodone-acetaminophen (NORCO) 10-325 MG tablet Take 0.5-1 tablets by mouth every 6 (six) hours as needed for severe pain. Post-operatively 10 tablet 0   Insulin Degludec (TRESIBA) 100 UNIT/ML SOLN Inject 36-52 Units into the skin daily. Per sliding scale     insulin lispro (HUMALOG) 100 UNIT/ML injection Inject 6-10 Units into the skin as needed for high blood sugar.      levothyroxine (SYNTHROID) 88 MCG tablet Take 88 mcg by mouth every other day. Alternate with 75 mcg     levothyroxine (SYNTHROID, LEVOTHROID) 75 MCG tablet Take 75 mcg by mouth every other day. Alternate with 88 mcg tab     losartan (COZAAR) 25 MG tablet Take by mouth.     Multiple Vitamin (MULTIVITAMIN) tablet Take 1 tablet by mouth daily.     mupirocin ointment (BACTROBAN) 2 % Apply 1 application  topically daily as needed (wound care).     pantoprazole (PROTONIX) 40 MG tablet Take  40 mg by mouth at bedtime.     Semaglutide, 2 MG/DOSE, (OZEMPIC, 2 MG/DOSE,) 8 MG/3ML SOPN Inject 2 mg into the skin once a week.     sertraline (ZOLOFT) 100 MG tablet Take 100 mg by mouth at bedtime.     vitamin C (ASCORBIC ACID) 500 MG tablet Take 500 mg by mouth daily.     No current facility-administered medications for this visit.    Allergies:   Bacitracin-polymyxin b, Metoprolol tartrate, Neosporin [neomycin-bacitracin zn-polymyx], Norvasc [amlodipine besylate], and Percodan [oxycodone-aspirin]    Social History:  The patient  reports that she has never smoked. She has never used smokeless tobacco. She reports that she does not drink alcohol and does not use drugs.   Family History:  The patient's family history includes Heart attack in her father; Hypertension in her father.    ROS:  Please see the history of present illness.   Otherwise, review of systems are positive for intentional weight loss by avoiding late night eating.   All other systems are reviewed and negative.    PHYSICAL EXAM: VS:  BP 120/62   Pulse 62   Ht 5' (1.524 m)   Wt 237 lb (107.5 kg)   SpO2 98%   BMI 46.29 kg/m  , BMI Body mass index is 46.29 kg/m. GEN: Well nourished, well developed, in no acute distress HEENT: normal Neck: no JVD, carotid bruits, or masses Cardiac: RRR; no murmurs, rubs, or gallops,no edema  Respiratory:  clear to auscultation bilaterally, normal work of breathing GI: soft, nontender, nondistended, +  BS, obese MS: no deformity or atrophy Skin: warm and dry, no rash Neuro:  Strength and sensation are intact Psych: euthymic mood, full affect   EKG:   The ekg ordered 10/13/22 demonstrates NSR, rate 70   Recent Labs: 03/05/2022: ALT 25; TSH 2.698 07/28/2022: BUN 13; Creatinine, Ser 0.88; Hemoglobin 10.8; Platelets 274; Potassium 4.6; Sodium 138   Lipid Panel No results found for: "CHOL", "TRIG", "HDL", "CHOLHDL", "VLDL", "LDLCALC", "LDLDIRECT"   Other studies Reviewed: Additional studies/ records that were reviewed today with results demonstrating: LDL 82 in April 2024, A1c 7.0.   ASSESSMENT AND PLAN:  AFib: Watchful waiting off of AMio.  Dofetilide would be the next option per EP. Maintaining NSR or now.  Acquired thrombophilia: No bleeding issues.   Eliquis for stroke prevention.  Obesity: losing weight.  Congratulated her on this accomplishment.  HTN: The current medical regimen is effective;  continue present plan and medications. DM: A1C 7.0 in 4/24. Hyperlipidemia: COntinue lipid lowering therapy.    Current medicines are reviewed at length with the patient today.  The patient concerns regarding her medicines were addressed.  The following changes have been made:  No change  Labs/ tests ordered today include:  No orders of the defined types were placed in this encounter.   Recommend 150 minutes/week of aerobic exercise Low fat, low carb, high fiber diet recommended  Disposition:   FU in 1 year with me   Signed, Lance Muss, MD  11/02/2022 3:48 PM    [facility] [address] [place], [place], [place]  [address] Phone: [phone]; Fax: [phone]

## 2022-11-02 ENCOUNTER — Ambulatory Visit: Payer: Medicare Other | Attending: Interventional Cardiology | Admitting: Interventional Cardiology

## 2022-11-02 ENCOUNTER — Encounter: Payer: Self-pay | Admitting: Interventional Cardiology

## 2022-11-02 VITALS — BP 120/62 | HR 62 | Ht 60.0 in | Wt 237.0 lb

## 2022-11-02 DIAGNOSIS — I4819 Other persistent atrial fibrillation: Secondary | ICD-10-CM | POA: Diagnosis not present

## 2022-11-02 DIAGNOSIS — E119 Type 2 diabetes mellitus without complications: Secondary | ICD-10-CM | POA: Diagnosis not present

## 2022-11-02 DIAGNOSIS — Z794 Long term (current) use of insulin: Secondary | ICD-10-CM

## 2022-11-02 DIAGNOSIS — E782 Mixed hyperlipidemia: Secondary | ICD-10-CM

## 2022-11-02 DIAGNOSIS — I119 Hypertensive heart disease without heart failure: Secondary | ICD-10-CM

## 2022-11-02 DIAGNOSIS — D6869 Other thrombophilia: Secondary | ICD-10-CM | POA: Diagnosis not present

## 2022-11-02 NOTE — Patient Instructions (Signed)
Medication Instructions:  Your physician recommends that you continue on your current medications as directed. Please refer to the Current Medication list given to you today.  *If you need a refill on your cardiac medications before your next appointment, please call your pharmacy*   Lab Work: none If you have labs (blood work) drawn today and your tests are completely normal, you will receive your results only by: MyChart Message (if you have MyChart) OR A paper copy in the mail If you have any lab test that is abnormal or we need to change your treatment, we will call you to review the results.   Testing/Procedures: none   Follow-Up: At [facility], you and your health needs are our priority.  As part of our continuing mission to provide you with exceptional heart care, we have created designated Provider Care Teams.  These Care Teams include your primary Cardiologist (physician) and Advanced Practice Providers (APPs -  Physician Assistants and Nurse Practitioners) who all work together to provide you with the care you need, when you need it.  We recommend signing up for the patient portal called "MyChart".  Sign up information is provided on this After Visit Summary.  MyChart is used to connect with patients for Virtual Visits (Telemedicine).  Patients are able to view lab/test results, encounter notes, upcoming appointments, etc.  Non-urgent messages can be sent to your provider as well.   To learn more about what you can do with MyChart, go to [email or web address].    Your next appointment:   9 month(s)  Provider:   Jayadeep Varanasi, MD     Other Instructions    

## 2022-11-06 ENCOUNTER — Telehealth: Payer: Self-pay | Admitting: Interventional Cardiology

## 2022-11-06 NOTE — Telephone Encounter (Signed)
  Pt said, she continue getting irregular heartbeat, she said, "I feel fine but I know it's there". She would like to speak with Dr. Maylon Cos nurse

## 2022-11-06 NOTE — Telephone Encounter (Signed)
Returned Pt's call about irregular heart beat. She denied having any current symptoms. Stated she woke up to a "hallow" feeling in her chest at 2am. BP- 98/76 HR- 93 The hallow feeling went away a hour or so later and has not restarted. Retook BP at 8:30 am BP- 68/56 HR- 78 and 1pm BP- 81/60 HR 101. Glucose this morning was 122. Pt stated when I first started talking to her she was still taking amiodarone. After further review of her medications she has stopped taking the amiodarone a few months ago. She took her BP while on the phone with me and that reading was 82/64 HR 82. She denies having dizziness, light headedness, or any other symptoms. She states she may have had less water today than normal, pt stated she would increase intake over the afternoon. Spoke with Dr. Lalla Brothers (DOD) about holding her Losartan until she is seen. Told pt to hold her losartan until she is seen in the office. Told pt I scheduled her a appointment on May 7th at 10:30 am with Dr. Nelly Laurence. She was unsure she could make that appointment as she does not drive and her son is at the beach and she is not sure when he will be back. She denied letting me call her son. She was going to give her son a call and call me back to let me know if she could make that appointment or needs another one. I gave her the triage number to where I am sitting today. Instructed Pt to call 911 if she experienced any symptoms before her visit with [place].  Pt called back to let me know she could make the appointment on the 7th. Restated to hold losartan and call 911 if needed.

## 2022-11-10 ENCOUNTER — Ambulatory Visit: Payer: Medicare Other | Admitting: Cardiovascular Disease

## 2023-01-06 ENCOUNTER — Other Ambulatory Visit: Payer: Medicare Other

## 2023-01-12 ENCOUNTER — Ambulatory Visit (HOSPITAL_COMMUNITY): Payer: Medicare Other | Admitting: Physician Assistant

## 2023-01-14 ENCOUNTER — Ambulatory Visit (HOSPITAL_COMMUNITY): Payer: Medicare Other | Admitting: Physician Assistant

## 2023-02-01 ENCOUNTER — Inpatient Hospital Stay (HOSPITAL_COMMUNITY): Admission: RE | Admit: 2023-02-01 | Payer: Medicare Other | Source: Ambulatory Visit | Admitting: Physician Assistant

## 2023-02-10 ENCOUNTER — Ambulatory Visit (HOSPITAL_COMMUNITY): Payer: Medicare Other | Admitting: Physician Assistant

## 2023-02-16 ENCOUNTER — Encounter (HOSPITAL_COMMUNITY): Payer: Self-pay | Admitting: Physician Assistant

## 2023-02-16 ENCOUNTER — Ambulatory Visit (HOSPITAL_COMMUNITY)
Admission: RE | Admit: 2023-02-16 | Discharge: 2023-02-16 | Disposition: A | Discharge status: Home / Self Care | Payer: Medicare Other | Source: Ambulatory Visit | Attending: Physician Assistant | Admitting: Physician Assistant

## 2023-02-16 VITALS — BP 112/64 | HR 67 | Ht 60.0 in | Wt 230.8 lb

## 2023-02-16 DIAGNOSIS — E669 Obesity, unspecified: Secondary | ICD-10-CM | POA: Insufficient documentation

## 2023-02-16 DIAGNOSIS — D6869 Other thrombophilia: Secondary | ICD-10-CM | POA: Diagnosis not present

## 2023-02-16 DIAGNOSIS — Z6841 Body Mass Index (BMI) 40.0 and over, adult: Secondary | ICD-10-CM | POA: Diagnosis not present

## 2023-02-16 DIAGNOSIS — I10 Essential (primary) hypertension: Secondary | ICD-10-CM | POA: Diagnosis not present

## 2023-02-16 DIAGNOSIS — N200 Calculus of kidney: Secondary | ICD-10-CM | POA: Diagnosis present

## 2023-02-16 DIAGNOSIS — I4819 Other persistent atrial fibrillation: Secondary | ICD-10-CM | POA: Insufficient documentation

## 2023-02-16 DIAGNOSIS — R319 Hematuria, unspecified: Secondary | ICD-10-CM | POA: Diagnosis present

## 2023-02-16 NOTE — Progress Notes (Signed)
Primary Care Physician: Lupita Raider, MD Primary Cardiologist: Dr Eldridge Dace  Primary Electrophysiologist: Dr Nelly Laurence Referring Physician: Dr Burna Mortimer is a 77 y.o. female with a history of DM, HTN, atrial fibrillation who presents for follow up in the [facility].  The patient was initially diagnosed with atrial fibrillation remotely. Patient is on Eliquis for a CHADS2VASC score of 5. She was started on amiodarone on 10/29/21 with plan for DCCV. She chemically converted to SR.   Patient seen by Dr Eldridge Dace 04/2022 and found to be in a junctional rhythm, amiodarone was decreased further and eventually discontinued. She was seen by Dr Nelly Laurence who recommended dofetilide loading. Unfortunately, she has needed several urologic procedures for a large kidney stone/hematuria which delayed her admission. This is followed by Dr Berneice Heinrich.   On follow up today, patient reports that she has done well since her last visit. She remains in SR. She has very palpitations that last for only 1-2 minutes. No recent bleeding issues.   Today, she denies symptoms of chest pain, shortness of breath, orthopnea, PND, lower extremity edema, dizziness, presyncope, syncope, snoring, daytime somnolence, bleeding, or neurologic sequela. The patient is tolerating medications without difficulties and is otherwise without complaint today.    Atrial Fibrillation Risk Factors:  she does not have symptoms or diagnosis of sleep apnea. she does not have a history of rheumatic fever.   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 Fabian Sharp 06-20-2021   PAF (paroxysmal atrial fibrillation) (HCC)    PONV (postoperative nausea and vomiting)    Vision changes 12/2021   double vision    ROS- All systems are reviewed and negative except as per the HPI above.  Physical Exam: Vitals:   02/16/23 1432  BP: 112/64  Pulse: 67  Weight: 104.7 kg  Height: 5' (1.524 m)     GEN: Well nourished, well developed in no acute distress NECK: No JVD; No carotid bruits CARDIAC: Regular rate and rhythm, no murmurs, rubs, gallops RESPIRATORY:  Clear to auscultation without rales, wheezing or rhonchi  ABDOMEN: Soft, non-tender, non-distended EXTREMITIES:  No edema; No deformity    Wt Readings from Last 3 Encounters:  02/16/23 104.7 kg  11/02/22 107.5 kg  10/13/22 110.2 kg    EKG today demonstrates  SR Vent. rate 67 BPM PR interval 170 ms QRS duration 84 ms QT/QTcB 396/418 ms  Echo 05/19/21 demonstrated  1. Left ventricular ejection fraction, by estimation, is 60 to 65%. Left  ventricular ejection fraction by 3D volume is 62 %. The left ventricle has normal function. The left ventricle has no regional wall motion  abnormalities. Left ventricular diastolic  parameters are consistent with Grade I diastolic dysfunction (impaired relaxation).  2. Right ventricular systolic function is normal. The right ventricular  size is normal. There is normal pulmonary artery systolic pressure. The  estimated right ventricular systolic pressure is 19.8 mmHg.   3. Left atrial size was moderately dilated.   4. The mitral valve is abnormal. Trivial mitral valve regurgitation.  Moderate mitral annular  calcification.   5. The aortic valve is tricuspid. Aortic valve regurgitation is not  visualized.   6. The inferior vena cava is normal in size with greater than 50%  respiratory variability, suggesting right atrial pressure of 3 mmHg.   Epic records are reviewed at length today  CHA2DS2-VASc Score = 5  The patient's score is based upon: CHF History: 0 HTN History: 1 Diabetes History: 1 Stroke History: 0 Vascular Disease History: 0 Age Score: 2 Gender Score: 1        ASSESSMENT AND PLAN: Persistent Atrial Fibrillation (ICD10:  I48.19) The patient's CHA2DS2-VASc score is 5, indicating a 7.2% annual risk of stroke.   Off amiodarone due to bradycardia/junctional rhythm.  Patient has deferred dofetilide loading. She appears to be maintaining SR off AAD for now.  Kardia mobile for home monitoring Continue Eliquis 5 mg BID  Secondary Hypercoagulable State (ICD10:  D68.69) The patient is at significant risk for stroke/thromboembolism based upon her CHA2DS2-VASc Score of 5.  Continue Apixaban (Eliquis).   Obesity Body mass index is 45.08 kg/m.  Encouraged lifestyle modification. Patient has slowly been losing weight, congratulated.   HTN Stable on current regimen   Follow up in the AF clinic in 6 months.    Jorja Loa PA-C Afib [facility] [address] [place], [place] [address] [phone] 02/16/2023 3:07 PM

## 2023-02-19 ENCOUNTER — Other Ambulatory Visit: Payer: Self-pay | Admitting: Cardiovascular Disease

## 2023-02-19 NOTE — Telephone Encounter (Signed)
Prescription refill request for Eliquis received. Indication:afib Last office visit:8/24 Scr:0.81  4/24 Age: 77 Weight:104.7  kg  Prescription refilled

## 2023-04-21 LAB — LAB REPORT - SCANNED
A1c: 6.8
EGFR: 86

## 2023-06-07 ENCOUNTER — Other Ambulatory Visit: Payer: Self-pay | Admitting: Otolaryngology

## 2023-06-07 DIAGNOSIS — R1314 Dysphagia, pharyngoesophageal phase: Secondary | ICD-10-CM

## 2023-07-13 ENCOUNTER — Other Ambulatory Visit: Payer: Medicare Other

## 2023-08-03 ENCOUNTER — Other Ambulatory Visit: Payer: Medicare Other

## 2023-08-07 DEATH — deceased

## 2023-08-17 ENCOUNTER — Encounter: Payer: Self-pay | Admitting: Cardiovascular Disease

## 2023-08-17 NOTE — Progress Notes (Signed)
Pt cancelled  This encounter was created in error - please disregard. 

## 2023-08-18 ENCOUNTER — Ambulatory Visit (HOSPITAL_COMMUNITY): Payer: Medicare Other | Admitting: Physician Assistant

## 2023-08-18 ENCOUNTER — Ambulatory Visit: Payer: Medicare Other | Admitting: Cardiovascular Disease

## 2023-08-24 ENCOUNTER — Ambulatory Visit
Admission: RE | Admit: 2023-08-24 | Discharge: 2023-08-24 | Disposition: A | Discharge status: Home / Self Care | Payer: Medicare Other | Source: Ambulatory Visit | Attending: Otolaryngology | Admitting: Otolaryngology

## 2023-08-24 DIAGNOSIS — R1314 Dysphagia, pharyngoesophageal phase: Secondary | ICD-10-CM

## 2023-08-27 ENCOUNTER — Ambulatory Visit (HOSPITAL_COMMUNITY)
Admission: RE | Admit: 2023-08-27 | Discharge: 2023-08-27 | Disposition: A | Discharge status: Home / Self Care | Payer: Medicare Other | Source: Ambulatory Visit | Attending: Physician Assistant | Admitting: Physician Assistant

## 2023-08-27 ENCOUNTER — Encounter (HOSPITAL_COMMUNITY): Payer: Self-pay | Admitting: Physician Assistant

## 2023-08-27 VITALS — BP 126/70 | HR 66 | Ht 60.0 in | Wt 228.0 lb

## 2023-08-27 DIAGNOSIS — Z7901 Long term (current) use of anticoagulants: Secondary | ICD-10-CM | POA: Diagnosis not present

## 2023-08-27 DIAGNOSIS — E1122 Type 2 diabetes mellitus with diabetic chronic kidney disease: Secondary | ICD-10-CM | POA: Insufficient documentation

## 2023-08-27 DIAGNOSIS — Z6841 Body Mass Index (BMI) 40.0 and over, adult: Secondary | ICD-10-CM | POA: Insufficient documentation

## 2023-08-27 DIAGNOSIS — N189 Chronic kidney disease, unspecified: Secondary | ICD-10-CM | POA: Diagnosis not present

## 2023-08-27 DIAGNOSIS — E669 Obesity, unspecified: Secondary | ICD-10-CM | POA: Insufficient documentation

## 2023-08-27 DIAGNOSIS — I4819 Other persistent atrial fibrillation: Secondary | ICD-10-CM | POA: Insufficient documentation

## 2023-08-27 DIAGNOSIS — D6869 Other thrombophilia: Secondary | ICD-10-CM | POA: Insufficient documentation

## 2023-08-27 DIAGNOSIS — R001 Bradycardia, unspecified: Secondary | ICD-10-CM | POA: Diagnosis not present

## 2023-08-27 DIAGNOSIS — I129 Hypertensive chronic kidney disease with stage 1 through stage 4 chronic kidney disease, or unspecified chronic kidney disease: Secondary | ICD-10-CM | POA: Diagnosis not present

## 2023-08-27 NOTE — Progress Notes (Signed)
Primary Care Physician: Lupita Raider, MD Primary Cardiologist: Dr Elease Hashimoto  Primary Electrophysiologist: Dr Nelly Laurence Referring Physician: Dr Burna Mortimer is a 78 y.o. female with a history of DM, HTN, atrial fibrillation who presents for follow up in the [facility].  The patient was initially diagnosed with atrial fibrillation remotely. Patient is on Eliquis for a CHADS2VASC score of 5. She was started on amiodarone on 10/29/21 with plan for DCCV. She chemically converted to SR.   Patient seen by Dr Eldridge Dace 04/2022 and found to be in a junctional rhythm, amiodarone was decreased further and eventually discontinued. She was seen by Dr Nelly Laurence who recommended dofetilide loading. Unfortunately, she has needed several urologic procedures for a large kidney stone/hematuria which delayed her admission. This is followed by Dr Berneice Heinrich.   Patient returns for follow up for atrial fibrillation. She reports that she has done well since her last visit with no interim symptoms of afib. No bleeding issues on anticoagulation. She continues to slowly lose weight through diet and exercise.   Today, he denies symptoms of palpitations, chest pain, shortness of breath, orthopnea, PND, lower extremity edema, dizziness, presyncope, syncope, snoring, daytime somnolence, bleeding, or neurologic sequela. The patient is tolerating medications without difficulties and is otherwise without complaint today.    Atrial Fibrillation Risk Factors:  she does not have symptoms or diagnosis of sleep apnea. she does not have a history of rheumatic fever.   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 Fabian Sharp 06-20-2021   PAF (paroxysmal atrial fibrillation) (HCC)    PONV (postoperative nausea and vomiting)    Vision changes 12/2021   double vision    ROS- All systems are reviewed and negative except as per the HPI above.  Physical Exam: Vitals:   08/27/23 1100  BP: 126/70  Pulse: 66  Weight: 103.4 kg  Height: 5' (1.524 m)     GEN: Well nourished, well developed in no acute distress CARDIAC: Regular rate and rhythm, no murmurs, rubs, gallops RESPIRATORY:  Clear to auscultation without rales, wheezing or rhonchi  ABDOMEN: Soft, non-tender, non-distended EXTREMITIES:  No edema; No deformity    Wt Readings from Last 3 Encounters:  08/27/23 103.4 kg  02/16/23 104.7 kg  11/02/22 107.5 kg    EKG today demonstrates  SR Vent. rate 66 BPM PR interval 164 ms QRS duration 86 ms QT/QTcB 400/419 ms   Echo 05/19/21 demonstrated  1. Left ventricular ejection fraction, by estimation, is 60 to 65%. Left  ventricular ejection fraction by 3D volume is 62 %. The left ventricle has normal function. The left ventricle has no regional wall motion  abnormalities. Left ventricular diastolic  parameters are consistent with Grade I diastolic dysfunction (impaired  relaxation).   2. Right ventricular systolic function is normal. The right ventricular  size is normal. There is normal pulmonary artery systolic pressure. The  estimated right ventricular systolic pressure is 19.8 mmHg.   3. Left atrial size was moderately dilated.   4. The mitral valve is abnormal. Trivial mitral valve  regurgitation.  Moderate mitral annular calcification.   5. The aortic valve is tricuspid. Aortic valve regurgitation is not  visualized.   6. The inferior vena cava is normal in size with greater than 50%  respiratory variability, suggesting right atrial pressure of 3 mmHg.   Epic records are reviewed at length today  CHA2DS2-VASc Score = 5  The patient's score is based upon: CHF History: 0 HTN History: 1 Diabetes History: 1 Stroke History: 0 Vascular Disease History: 0 Age Score: 2 Gender Score: 1       ASSESSMENT AND PLAN: Persistent Atrial Fibrillation (ICD10:  I48.19) The patient's CHA2DS2-VASc score is 5, indicating a 7.2% annual risk of stroke.   Amiodarone discontinued due to bradycardia/junctional rhythm Patient has deferred dofetilide loading and has remained in SR. If she has recurrent afib, she is agreeable to dofetilide admission.  Continue Eliquis 5 mg BID Kardia mobile for home monitoring  Secondary Hypercoagulable State (ICD10:  [phone]) The patient is at significant risk for stroke/thromboembolism based upon her CHA2DS2-VASc Score of 5.  Continue Apixaban (Eliquis). No bleeding issues  Obesity Body mass index is 44.53 kg/m.  Encouraged lifestyle modification Patient congratulated on weight loss efforts.   HTN Stable on current regimen   Follow up in the AF clinic in one year.    Jorja Loa PA-C Afib Clinic [facility] [address] [place], [place] [address] [phone] 08/27/2023 11:18 AM

## 2023-10-05 ENCOUNTER — Ambulatory Visit: Payer: Medicare Other | Admitting: Cardiovascular Disease

## 2023-11-04 NOTE — Progress Notes (Signed)
  Cardiology Office Note:  .   Date:  11/05/2023  ID:  Carrie Newton, DOB April 12, 1946, MRN [id number] PCP: Glena Landau, MD  [facility] Providers Cardiologist:  Avery Bodo, MD Electrophysiologist:  Efraim Grange, MD    History of Present Illness: .    Feb. 12, 2025 No show  May 2, 205   Carrie Newton is a 78 y.o. female previous patent of Dr. Jacquelynn Matter.  I am meeting her for the first time today  She has a hx of obesity, atrial fib , chronic diastolic CHF  She has been on Amiodarone  in the past - this was stopped by the Afib clinic  Their next recommendation was to start tikosyn if she has recurrent atrial fib   She has been see also by Dr. Arlester Ladd and the Afib clinic   Has a Kardia monitor , No recent episodes of atrial fib          ROS:   Studies Reviewed: .         Risk Assessment/Calculations:    CHA2DS2-VASc Score = 5  This indicates a 7.2% annual risk of stroke. The patient's score is based upon: CHF History: 0 HTN History: 1 Diabetes History: 1 Stroke History: 0 Vascular Disease History: 0 Age Score: 2 Gender Score: 1            Physical Exam:   VS:  BP 124/82   Pulse (!) 52   Ht 5' (1.524 m)   Wt 224 lb 9.6 oz (101.9 kg)   SpO2 98%   BMI 43.86 kg/m    Wt Readings from Last 3 Encounters:  11/05/23 224 lb 9.6 oz (101.9 kg)  08/27/23 228 lb (103.4 kg)  02/16/23 230 lb 12.8 oz (104.7 kg)    GEN:  moderately obese female in no acute distress NECK: No JVD; No carotid bruits CARDIAC: RRR, no murmurs, rubs, gallops RESPIRATORY:  Clear to auscultation without rales, wheezing or rhonchi  ABDOMEN: Soft, non-tender, non-distended EXTREMITIES:  No edema; No deformity   ASSESSMENT AND PLAN: .   1.  Paroxysmal atrial fibrillation: She is also followed in the A-fib clinic.  She is now off amiodarone .  She will continue on Eliquis .  She has a Kardia monitor and will try to catch any arrhythmias that occur.  2.  Chronic  diastolic congestive heart failure: This appears to be stable.  3.  Obesity: She continues with her weight loss efforts appear       Dispo: 1 year    Signed, Ahmad Alert, MD

## 2023-11-05 ENCOUNTER — Ambulatory Visit: Attending: Cardiology | Admitting: Cardiovascular Disease

## 2023-11-05 ENCOUNTER — Encounter: Payer: Self-pay | Admitting: Cardiovascular Disease

## 2023-11-05 VITALS — BP 124/82 | HR 52 | Ht 60.0 in | Wt 224.6 lb

## 2023-11-05 DIAGNOSIS — I5030 Unspecified diastolic (congestive) heart failure: Secondary | ICD-10-CM | POA: Insufficient documentation

## 2023-11-05 DIAGNOSIS — I10 Essential (primary) hypertension: Secondary | ICD-10-CM | POA: Diagnosis not present

## 2023-11-05 DIAGNOSIS — I5032 Chronic diastolic (congestive) heart failure: Secondary | ICD-10-CM

## 2023-11-05 DIAGNOSIS — I4819 Other persistent atrial fibrillation: Secondary | ICD-10-CM

## 2023-11-05 DIAGNOSIS — D6869 Other thrombophilia: Secondary | ICD-10-CM

## 2023-11-05 DIAGNOSIS — I480 Paroxysmal atrial fibrillation: Secondary | ICD-10-CM

## 2023-11-05 NOTE — Patient Instructions (Signed)
 Medication Instructions:  Your physician recommends that you continue on your current medications as directed. Please refer to the Current Medication list given to you today.  *If you need a refill on your cardiac medications before your next appointment, please call your pharmacy*  Lab Work: If you have labs (blood work) drawn today and your tests are completely normal, you will receive your results only by: MyChart Message (if you have MyChart) OR A paper copy in the mail If you have any lab test that is abnormal or we need to change your treatment, we will call you to review the results.  Testing/Procedures: None ordered today.  Follow-Up: At [facility], you and your health needs are our priority.  As part of our continuing mission to provide you with exceptional heart care, our providers are all part of one team.  This team includes your primary Cardiologist (physician) and Advanced Practice Providers or APPs (Physician Assistants and Nurse Practitioners) who all work together to provide you with the care you need, when you need it.  Your next appointment:   1 year(s)  Provider:   Atrial Fib Clinic    We recommend signing up for the patient portal called "MyChart".  Sign up information is provided on this After Visit Summary.  MyChart is used to connect with patients for Virtual Visits (Telemedicine).  Patients are able to view lab/test results, encounter notes, upcoming appointments, etc.  Non-urgent messages can be sent to your provider as well.   To learn more about what you can do with MyChart, go to [email or web address].

## 2023-11-16 LAB — LAB REPORT - SCANNED
A1c: 8.2
Creatinine, POC: 276 mg/dL
EGFR (Non-African Amer.): 68
Microalb Creat Ratio: 56.9
Microalbumin, Urine: 15.73
TSH: 3.49 (ref 0.41–5.90)

## 2023-11-24 ENCOUNTER — Other Ambulatory Visit (HOSPITAL_COMMUNITY): Payer: Self-pay

## 2023-11-24 MED ORDER — DILTIAZEM HCL 30 MG PO TABS: ORAL_TABLET | ORAL | 1 refills | Status: DC

## 2023-12-16 ENCOUNTER — Other Ambulatory Visit (HOSPITAL_COMMUNITY): Payer: Self-pay | Admitting: *Deleted

## 2023-12-16 MED ORDER — DILTIAZEM HCL 30 MG PO TABS: ORAL_TABLET | ORAL | 1 refills | Status: DC

## 2023-12-31 ENCOUNTER — Other Ambulatory Visit (HOSPITAL_COMMUNITY): Payer: Self-pay | Admitting: *Deleted

## 2023-12-31 MED ORDER — DILTIAZEM HCL 30 MG PO TABS: ORAL_TABLET | ORAL | 1 refills | Status: AC

## 2024-01-04 ENCOUNTER — Ambulatory Visit (HOSPITAL_COMMUNITY): Admitting: Physician Assistant

## 2024-01-05 ENCOUNTER — Ambulatory Visit (HOSPITAL_COMMUNITY)
Admission: RE | Admit: 2024-01-05 | Discharge: 2024-01-05 | Disposition: A | Discharge status: Home / Self Care | Source: Ambulatory Visit | Attending: Physician Assistant | Admitting: Physician Assistant

## 2024-01-05 ENCOUNTER — Encounter (HOSPITAL_COMMUNITY): Payer: Self-pay | Admitting: Physician Assistant

## 2024-01-05 VITALS — BP 122/76 | HR 140 | Ht 60.0 in | Wt 225.5 lb

## 2024-01-05 DIAGNOSIS — D6869 Other thrombophilia: Secondary | ICD-10-CM | POA: Diagnosis not present

## 2024-01-05 DIAGNOSIS — I4819 Other persistent atrial fibrillation: Secondary | ICD-10-CM | POA: Diagnosis not present

## 2024-01-05 LAB — CBC
Hematocrit: 39 % (ref 34.0–46.6)
Hemoglobin: 12.5 g/dL (ref 11.1–15.9)
MCH: 29.5 pg (ref 26.6–33.0)
MCHC: 32.1 g/dL (ref 31.5–35.7)
MCV: 92 fL (ref 79–97)
Platelets: 253 10*3/uL (ref 150–450)
RBC: 4.24 x10E6/uL (ref 3.77–5.28)
RDW: 13.6 % (ref 11.7–15.4)
WBC: 6 10*3/uL (ref 3.4–10.8)

## 2024-01-05 LAB — BASIC METABOLIC PANEL WITH GFR
BUN/Creatinine Ratio: 13 (ref 12–28)
BUN: 10 mg/dL (ref 8–27)
CO2: 25 mmol/L (ref 20–29)
Calcium: 8.7 mg/dL (ref 8.7–10.3)
Chloride: 103 mmol/L (ref 96–106)
Creatinine, Ser: 0.78 mg/dL (ref 0.57–1.00)
Glucose: 166 mg/dL — ABNORMAL HIGH (ref 70–99)
Potassium: 4.2 mmol/L (ref 3.5–5.2)
Sodium: 140 mmol/L (ref 134–144)
eGFR: 78 mL/min/{1.73_m2} (ref >59)

## 2024-01-05 NOTE — Progress Notes (Signed)
 Primary Care Physician: Loreli Kins, MD Primary Cardiologist: Dr Alveta  Primary Electrophysiologist: Dr Nancey Referring Physician: Dr Dann Heron Carrie Newton is a 78 y.o. female with a history of DM, HTN, atrial fibrillation who presents for follow up in the [facility].  The patient was initially diagnosed with atrial fibrillation remotely. Patient is on Eliquis  for stroke prevention. She was started on amiodarone  on 10/29/21 with plan for DCCV. She chemically converted to SR.   Patient seen by Dr Dann 04/2022 and found to be in a junctional rhythm, amiodarone  was decreased further and eventually discontinued. She was seen by Dr Nancey who recommended dofetilide loading. Unfortunately, she has needed several urologic procedures for a large kidney stone/hematuria which delayed her admission. This is followed by Dr Alvaro.   Patient returns for follow up for atrial fibrillation. She reports that after steroid injections in her thumb and knees about 6 weeks ago she noticed her heart rates increase. Her rates range from 70s-140 bpm. She has been using her PRN diltiazem . No bleeding issues on anticoagulation.   Today, she  denies symptoms of chest pain, shortness of breath, orthopnea, PND, lower extremity edema, dizziness, presyncope, syncope, snoring, daytime somnolence, bleeding, or neurologic sequela. The patient is tolerating medications without difficulties and is otherwise without complaint today.    Atrial Fibrillation Risk Factors:  she does not have symptoms or diagnosis of sleep apnea. she does not have a history of rheumatic fever.   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 Glendia Gaudy 06-20-2021   PAF (paroxysmal atrial fibrillation) (HCC)    PONV (postoperative nausea and vomiting)    Vision changes 12/2021   double vision    ROS- All systems are reviewed and negative except as per the HPI above.  Physical Exam: Vitals:   01/05/24 1402  BP: 122/76  Pulse: (!) 140  Weight: 102.3 kg  Height: 5' (1.524 m)    GEN: Well nourished, well developed in no acute distress NECK: No JVD CARDIAC: Irregularly irregular rate and rhythm, no murmurs, rubs, gallops RESPIRATORY:  Clear to auscultation without rales, wheezing or rhonchi  ABDOMEN: Soft, non-tender, non-distended EXTREMITIES:  No edema; No deformity    Wt Readings from Last 3 Encounters:  01/05/24 102.3 kg  11/05/23 101.9 kg  08/27/23 103.4 kg    EKG today demonstrates  Afib with RVR Vent. rate 140 BPM PR interval * ms QRS duration 82 ms QT/QTcB 302/461 ms   Echo 05/19/21 demonstrated  1. Left ventricular ejection fraction, by estimation, is 60 to 65%. Left  ventricular ejection fraction by 3D volume is 62 %. The left ventricle has normal function. The left ventricle has no regional wall motion  abnormalities. Left ventricular diastolic  parameters are consistent with Grade I diastolic dysfunction (impaired relaxation).   2. Right ventricular systolic function is normal. The right ventricular  size is normal. There is normal pulmonary artery systolic pressure. The  estimated right ventricular systolic pressure is 19.8 mmHg.   3. Left atrial size was moderately dilated.    4. The mitral valve is abnormal. Trivial mitral valve regurgitation.  Moderate mitral annular calcification.   5. The aortic valve is tricuspid. Aortic valve regurgitation is not  visualized.   6. The inferior vena cava is normal in size with greater than 50%  respiratory variability, suggesting right atrial pressure of 3 mmHg.   Epic records are reviewed at length today  CHA2DS2-VASc Score = 5  The patient's score is based upon: CHF History: 0 HTN History: 1 Diabetes History: 1 Stroke History: 0 Vascular Disease History: 0 Age Score: 2 Gender Score: 1       ASSESSMENT AND PLAN: Persistent Atrial Fibrillation (ICD10:  I48.19) The patient's CHA2DS2-VASc score is 5, indicating a 7.2% annual risk of stroke.   Amiodarone  discontinued due to bradycardia/junctional rhythm Patient back in persistent afib with elevated rates. We discussed rhythm control options today. Will plan for DCCV. If she has quick return of her afib, will pursue dofetilide loading. She inquires about afib ablation. We discussed that ablation is more likely to be successful if her BMI is <40. She will continue her weight loss efforts.  Check bmet/cbc Continue Eliquis  5 mg BID, no missed doses in the past 3 weeks.  Kardia mobile for home monitoring.   Secondary Hypercoagulable State (ICD10:  D68.69) The patient is at significant risk for stroke/thromboembolism based upon her CHA2DS2-VASc Score of 5.  Continue Apixaban  (Eliquis ). No bleeding issues.   Obesity Body mass index is 44.04 kg/m.  Encouraged lifestyle modification On Ozepmic  HTN Stable on current regimen   Follow up in the AF clinic post DCCV.    Informed Consent   Shared Decision Making/Informed Consent The risks (stroke, cardiac arrhythmias rarely resulting in the need for a temporary or permanent pacemaker, skin irritation or burns and complications associated with conscious sedation including aspiration, arrhythmia, respiratory failure and  death), benefits (restoration of normal sinus rhythm) and alternatives of a direct current cardioversion were explained in detail to [name] and she agrees to proceed.       [name] PA-C [facility] [address] [place], [place] [address] [phone] 01/05/2024 3:04 PM

## 2024-01-05 NOTE — Patient Instructions (Signed)
 Cardioversion scheduled for: Wednesday, July 9th   - Arrive at the [facility] ([address])  and check in with ADMITTING at 10:30AM   - Do not eat or drink anything after midnight the night prior to your procedure.   - Take all your morning medication (except diabetic medications) with a sip of water prior to arrival.  - Do NOT miss any doses of your blood thinner - if you should miss a dose or take a dose more than 4 hours late -- please notify our office immediately.  - You will not be able to drive home after your procedure. Please ensure you have a responsible adult to drive you home. You will need someone with you for 24 hours post procedure.     - Expect to be in the procedural area approximately 2 hours.   - If you feel as if you go back into normal rhythm prior to scheduled cardioversion, please notify our office immediately.   If your procedure is canceled in the cardioversion suite you will be charged a cancellation fee.    Hold below medications 7 days prior to scheduled procedure/anesthesia.  Restart medication on the normal dosing day after scheduled procedure/anesthesia   Semaglutide (Ozempic)     For those patients who have a scheduled procedure/anesthesia on the same day of the week as their dose, hold the medication on the day of surgery.  They can take their scheduled dose the week before.  **Patients on the above medications scheduled for elective procedures that have not held the medication for the appropriate amount of time are at risk of cancellation or change in the anesthetic plan.

## 2024-01-05 NOTE — H&P (View-Only) (Signed)
 Primary Care Physician: Carrie Kins, MD Primary Cardiologist: Dr Carrie Newton  Primary Electrophysiologist: Dr Carrie Newton Referring Physician: Dr Carrie Newton Carrie Newton is a 78 y.o. female with a history of DM, HTN, atrial fibrillation who presents for follow up in the [facility].  The patient was initially diagnosed with atrial fibrillation remotely. Patient is on Eliquis  for stroke prevention. She was started on amiodarone  on 10/29/21 with plan for DCCV. She chemically converted to SR.   Patient seen by Dr Carrie 04/2022 and found to be in a junctional rhythm, amiodarone  was decreased further and eventually discontinued. She was seen by Dr Carrie Newton who recommended dofetilide loading. Unfortunately, she has needed several urologic procedures for a large kidney stone/hematuria which delayed her admission. This is followed by Dr Carrie Newton.   Patient returns for follow up for atrial fibrillation. She reports that after steroid injections in her thumb and knees about 6 weeks ago she noticed her heart rates increase. Her rates range from 70s-140 bpm. She has been using her PRN diltiazem . No bleeding issues on anticoagulation.   Today, she  denies symptoms of chest pain, shortness of breath, orthopnea, PND, lower extremity edema, dizziness, presyncope, syncope, snoring, daytime somnolence, bleeding, or neurologic sequela. The patient is tolerating medications without difficulties and is otherwise without complaint today.    Atrial Fibrillation Risk Factors:  she does not have symptoms or diagnosis of sleep apnea. she does not have a history of rheumatic fever.   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 Glendia Newton 06-20-2021   PAF (paroxysmal atrial fibrillation) (HCC)    PONV (postoperative nausea and vomiting)    Vision changes 12/2021   double vision    ROS- All systems are reviewed and negative except as per the HPI above.  Physical Exam: Vitals:   01/05/24 1402  BP: 122/76  Pulse: (!) 140  Weight: 102.3 kg  Height: 5' (1.524 m)    GEN: Well nourished, well developed in no acute distress NECK: No JVD CARDIAC: Irregularly irregular rate and rhythm, no murmurs, rubs, gallops RESPIRATORY:  Clear to auscultation without rales, wheezing or rhonchi  ABDOMEN: Soft, non-tender, non-distended EXTREMITIES:  No edema; No deformity    Wt Readings from Last 3 Encounters:  01/05/24 102.3 kg  11/05/23 101.9 kg  08/27/23 103.4 kg    EKG today demonstrates  Afib with RVR Vent. rate 140 BPM PR interval * ms QRS duration 82 ms QT/QTcB 302/461 ms   Echo 05/19/21 demonstrated  1. Left ventricular ejection fraction, by estimation, is 60 to 65%. Left  ventricular ejection fraction by 3D volume is 62 %. The left ventricle has normal function. The left ventricle has no regional wall motion  abnormalities. Left ventricular diastolic  parameters are consistent with Grade I diastolic dysfunction (impaired relaxation).   2. Right ventricular systolic function is normal. The right ventricular  size is normal. There is normal pulmonary artery systolic pressure. The  estimated right ventricular systolic pressure is 19.8 mmHg.   3. Left atrial size was moderately dilated.    4. The mitral valve is abnormal. Trivial mitral valve regurgitation.  Moderate mitral annular calcification.   5. The aortic valve is tricuspid. Aortic valve regurgitation is not  visualized.   6. The inferior vena cava is normal in size with greater than 50%  respiratory variability, suggesting right atrial pressure of 3 mmHg.   Epic records are reviewed at length today  CHA2DS2-VASc Score = 5  The patient's score is based upon: CHF History: 0 HTN History: 1 Diabetes History: 1 Stroke History: 0 Vascular Disease History: 0 Age Score: 2 Gender Score: 1       ASSESSMENT AND PLAN: Persistent Atrial Fibrillation (ICD10:  I48.19) The patient's CHA2DS2-VASc score is 5, indicating a 7.2% annual risk of stroke.   Amiodarone  discontinued due to bradycardia/junctional rhythm Patient back in persistent afib with elevated rates. We discussed rhythm control options today. Will plan for DCCV. If she has quick return of her afib, will pursue dofetilide loading. She inquires about afib ablation. We discussed that ablation is more likely to be successful if her BMI is <40. She will continue her weight loss efforts.  Check bmet/cbc Continue Eliquis  5 mg BID, no missed doses in the past 3 weeks.  Kardia mobile for home monitoring.   Secondary Hypercoagulable State (ICD10:  D68.69) The patient is at significant risk for stroke/thromboembolism based upon her CHA2DS2-VASc Score of 5.  Continue Apixaban  (Eliquis ). No bleeding issues.   Obesity Body mass index is 44.04 kg/m.  Encouraged lifestyle modification On Ozepmic  HTN Stable on current regimen   Follow up in the AF clinic post DCCV.    Informed Consent   Shared Decision Making/Informed Consent The risks (stroke, cardiac arrhythmias rarely resulting in the need for a temporary or permanent pacemaker, skin irritation or burns and complications associated with conscious sedation including aspiration, arrhythmia, respiratory failure and  death), benefits (restoration of normal sinus rhythm) and alternatives of a direct current cardioversion were explained in detail to Carrie Newton and she agrees to proceed.       Carrie Kicks PA-C [facility] [address] [place], [place] [address] [phone] 01/05/2024 3:04 PM

## 2024-01-06 ENCOUNTER — Ambulatory Visit (HOSPITAL_COMMUNITY): Payer: Self-pay | Admitting: Physician Assistant

## 2024-01-11 NOTE — Progress Notes (Signed)
 Spoke to patient and instructed them to come at 0930  and to be NPO after 0000.     Confirmed that patient will have a ride home and someone to stay with them for 24 hours after the procedure.  Confirmed blood thinner. Confirmed no breaks in taking blood thinner for 3+ weeks prior to procedure. Confirmed patient stopped all GLP-1s and GLP-2s for at least one week before procedure.

## 2024-01-12 ENCOUNTER — Ambulatory Visit (HOSPITAL_COMMUNITY): Admitting: Anesthesiology

## 2024-01-12 ENCOUNTER — Other Ambulatory Visit: Payer: Self-pay

## 2024-01-12 ENCOUNTER — Encounter (HOSPITAL_COMMUNITY)
Admission: RE | Disposition: A | Discharge status: Home / Self Care | Payer: Self-pay | Source: Home / Self Care | Attending: Cardiology

## 2024-01-12 ENCOUNTER — Ambulatory Visit (HOSPITAL_COMMUNITY)
Admission: RE | Admit: 2024-01-12 | Discharge: 2024-01-12 | Disposition: A | Discharge status: Home / Self Care | Attending: Cardiology | Admitting: Cardiology

## 2024-01-12 DIAGNOSIS — I4819 Other persistent atrial fibrillation: Secondary | ICD-10-CM | POA: Diagnosis present

## 2024-01-12 DIAGNOSIS — D6869 Other thrombophilia: Secondary | ICD-10-CM | POA: Insufficient documentation

## 2024-01-12 DIAGNOSIS — Z6841 Body Mass Index (BMI) 40.0 and over, adult: Secondary | ICD-10-CM | POA: Diagnosis not present

## 2024-01-12 DIAGNOSIS — N189 Chronic kidney disease, unspecified: Secondary | ICD-10-CM | POA: Insufficient documentation

## 2024-01-12 DIAGNOSIS — I10 Essential (primary) hypertension: Secondary | ICD-10-CM

## 2024-01-12 DIAGNOSIS — E1122 Type 2 diabetes mellitus with diabetic chronic kidney disease: Secondary | ICD-10-CM | POA: Diagnosis not present

## 2024-01-12 DIAGNOSIS — Z7901 Long term (current) use of anticoagulants: Secondary | ICD-10-CM | POA: Diagnosis not present

## 2024-01-12 DIAGNOSIS — E66813 Obesity, class 3: Secondary | ICD-10-CM | POA: Insufficient documentation

## 2024-01-12 DIAGNOSIS — F419 Anxiety disorder, unspecified: Secondary | ICD-10-CM | POA: Diagnosis not present

## 2024-01-12 DIAGNOSIS — Z7985 Long-term (current) use of injectable non-insulin antidiabetic drugs: Secondary | ICD-10-CM | POA: Diagnosis not present

## 2024-01-12 DIAGNOSIS — Z79899 Other long term (current) drug therapy: Secondary | ICD-10-CM | POA: Diagnosis not present

## 2024-01-12 DIAGNOSIS — I129 Hypertensive chronic kidney disease with stage 1 through stage 4 chronic kidney disease, or unspecified chronic kidney disease: Secondary | ICD-10-CM | POA: Insufficient documentation

## 2024-01-12 DIAGNOSIS — E119 Type 2 diabetes mellitus without complications: Secondary | ICD-10-CM

## 2024-01-12 HISTORY — PX: CARDIOVERSION: EP1203

## 2024-01-12 MED ORDER — SODIUM CHLORIDE 0.9% FLUSH: 3.0000 mL | Freq: Two times a day (BID) | INTRAVENOUS | Status: DC

## 2024-01-12 MED ORDER — SODIUM CHLORIDE 0.9% FLUSH: 3.0000 mL | INTRAVENOUS | Status: DC | PRN

## 2024-01-12 MED ORDER — LIDOCAINE 2% (20 MG/ML) 5 ML SYRINGE
INTRAMUSCULAR | Status: DC | PRN
  Administered 2024-01-12: 60 mg via INTRAVENOUS

## 2024-01-12 MED ORDER — PROPOFOL 10 MG/ML IV BOLUS
INTRAVENOUS | Status: DC | PRN
  Administered 2024-01-12: 50 mg via INTRAVENOUS
  Administered 2024-01-12: 20 mg via INTRAVENOUS

## 2024-01-12 SURGICAL SUPPLY — 1 items: PAD DEFIB RADIO PHYSIO CONN (PAD) ×1 IMPLANT

## 2024-01-12 NOTE — CV Procedure (Signed)
   Electrical Cardioversion Procedure Note Carrie Newton [id number] Jul 17, 1945  Procedure: Electrical Cardioversion Indications:  Atrial Fibrillation  Time Out: Verified patient identification, verified procedure,medications/allergies/relevent history reviewed, required imaging and test results available.  Performed  Procedure Details  The patient signed informed consent.   The patient was NPO past midnight. Has had therapeutic anticoagulation with Eliquis   greater than 3 weeks. The patient denies any interruption of anticoagulation.  Anesthesia was administered by Dr. Gable  Adequate airway was maintained throughout and vital followed per protocol.  He was cardioverted x 1 with 200J of biphasic synchronized energy.  He converted to NSR.  There were no apparent complications.  The patient tolerated the procedure well and had normal neuro status and respiratory status post procedure with vitals stable as recorded elsewhere.     IMPRESSION:  Successful cardioversion of atrial fibrillation   Follow up:  We will arrange follow up with primary cardiologist.  He will continue on current medical therapy.  The patient advised to continue anticoagulation.  Carrie Newton 01/12/2024, 12:37 PM

## 2024-01-12 NOTE — Discharge Instructions (Signed)

## 2024-01-12 NOTE — Transfer of Care (Signed)
 Immediate Anesthesia Transfer of Care Note  Patient: Carrie Newton  Procedure(s) Performed: CARDIOVERSION  Patient Location: PACU  Anesthesia Type:MAC  Level of Consciousness: awake and sedated  Airway & Oxygen Therapy: Patient Spontanous Breathing and Patient connected to nasal cannula oxygen  Post-op Assessment: Report given to RN and Post -op Vital signs reviewed and stable  Post vital signs: Reviewed and stable  Last Vitals:  Vitals Value Taken Time  BP    Temp    Pulse    Resp    SpO2      Last Pain:  Vitals:   01/12/24 1018  TempSrc:   PainSc: 0-No pain         Complications: No notable events documented.

## 2024-01-12 NOTE — Interval H&P Note (Signed)
 History and Physical Interval Note:  01/12/2024 10:01 AM  Carrie Newton  has presented today for surgery, with the diagnosis of afib.  The various methods of treatment have been discussed with the patient and family. After consideration of risks, benefits and other options for treatment, the patient has consented to  Procedure(s): CARDIOVERSION (N/A) as a surgical intervention.  The patient's history has been reviewed, patient examined, no change in status, stable for surgery.  I have reviewed the patient's chart and labs.  Questions were answered to the patient's satisfaction.     Mitzy Naron

## 2024-01-12 NOTE — Anesthesia Postprocedure Evaluation (Signed)
 Anesthesia Post Note  Patient: Carrie Newton  Procedure(s) Performed: CARDIOVERSION     Patient location during evaluation: Cath Lab Anesthesia Type: General Level of consciousness: awake and alert Pain management: pain level controlled Vital Signs Assessment: post-procedure vital signs reviewed and stable Respiratory status: spontaneous breathing, nonlabored ventilation, respiratory function stable and patient connected to nasal cannula oxygen Cardiovascular status: blood pressure returned to baseline and stable Postop Assessment: no apparent nausea or vomiting Anesthetic complications: no   No notable events documented.  Last Vitals:  Vitals:   01/12/24 1130 01/12/24 1135  BP: 106/62 119/61  Pulse: 62 60  Resp: 17 (!) 9  Temp: 36.7 C   SpO2: 96% 96%    Last Pain:  Vitals:   01/12/24 1130  TempSrc: Temporal  PainSc: 0-No pain                 [name] S

## 2024-01-12 NOTE — Anesthesia Preprocedure Evaluation (Signed)
 Anesthesia Evaluation  Patient identified by MRN, date of birth, ID band Patient awake    Reviewed: Allergy & Precautions, H&P , NPO status , Patient's Chart, lab work & pertinent test results  History of Anesthesia Complications (+) PONV and history of anesthetic complications  Airway Mallampati: II   Neck ROM: full    Dental   Pulmonary neg pulmonary ROS   breath sounds clear to auscultation       Cardiovascular hypertension, + dysrhythmias Atrial Fibrillation  Rhythm:irregular Rate:Normal     Neuro/Psych   Anxiety        GI/Hepatic   Endo/Other  diabetes, Type 2    Renal/GU stones     Musculoskeletal  (+) Arthritis ,    Abdominal   Peds  Hematology   Anesthesia Other Findings   Reproductive/Obstetrics                              Anesthesia Physical Anesthesia Plan  ASA: 3  Anesthesia Plan: General   Post-op Pain Management:    Induction: Intravenous  PONV Risk Score and Plan: 4 or greater and Propofol  infusion and Treatment may vary due to age or medical condition  Airway Management Planned: Mask  Additional Equipment:   Intra-op Plan:   Post-operative Plan:   Informed Consent: I have reviewed the patients History and Physical, chart, labs and discussed the procedure including the risks, benefits and alternatives for the proposed anesthesia with the patient or authorized representative who has indicated his/her understanding and acceptance.     Dental advisory given  Plan Discussed with: CRNA, Anesthesiologist and Surgeon  Anesthesia Plan Comments:         Anesthesia Quick Evaluation

## 2024-01-13 ENCOUNTER — Encounter (HOSPITAL_COMMUNITY): Payer: Self-pay | Admitting: Cardiology

## 2024-01-19 ENCOUNTER — Ambulatory Visit (HOSPITAL_COMMUNITY): Admitting: Physician Assistant

## 2024-02-01 ENCOUNTER — Ambulatory Visit (HOSPITAL_COMMUNITY)
Admission: RE | Admit: 2024-02-01 | Discharge: 2024-02-01 | Disposition: A | Discharge status: Home / Self Care | Source: Ambulatory Visit | Attending: Physician Assistant | Admitting: Physician Assistant

## 2024-02-01 ENCOUNTER — Encounter (HOSPITAL_COMMUNITY): Payer: Self-pay | Admitting: Physician Assistant

## 2024-02-01 VITALS — BP 138/80 | HR 59 | Ht 60.0 in | Wt 224.6 lb

## 2024-02-01 DIAGNOSIS — D6869 Other thrombophilia: Secondary | ICD-10-CM

## 2024-02-01 DIAGNOSIS — I4819 Other persistent atrial fibrillation: Secondary | ICD-10-CM | POA: Diagnosis not present

## 2024-02-01 NOTE — Progress Notes (Signed)
 Primary Care Physician: Loreli Kins, MD Primary Cardiologist: Dr Alveta  Primary Electrophysiologist: Dr Nancey Referring Physician: Dr Dann Heron Carrie Newton is a 78 y.o. female with a history of DM, HTN, atrial fibrillation who presents for follow up in the [facility].  The patient was initially diagnosed with atrial fibrillation remotely. Patient is on Eliquis  for stroke prevention. She was started on amiodarone  on 10/29/21 with plan for DCCV. She chemically converted to SR.   Patient seen by Dr Dann 04/2022 and found to be in a junctional rhythm, amiodarone  was decreased further and eventually discontinued. She was seen by Dr Nancey who recommended dofetilide loading. Unfortunately, she has needed several urologic procedures for a large kidney stone/hematuria which delayed her admission. This is followed by Dr Alvaro.   Patient returns for follow up for atrial fibrillation. She is s/p DCCV on 01/12/24. She remains in SR today and feels well. No bleeding issues on anticoagulation.   Today, she  denies symptoms of palpitations, chest pain, shortness of breath, orthopnea, PND, lower extremity edema, dizziness, presyncope, syncope, snoring, daytime somnolence, bleeding, or neurologic sequela. The patient is tolerating medications without difficulties and is otherwise without complaint today.    Atrial Fibrillation Risk Factors:  she does not have symptoms or diagnosis of sleep apnea. she does not have a history of rheumatic fever.   Atrial Fibrillation Management history:  Previous antiarrhythmic drugs: amiodarone   Previous cardioversions: 01/12/24 Previous ablations: none Anticoagulation history: Eliquis    Past Medical History:  Diagnosis Date   Anemia    Anxiety    Arthritis    Brain tumor (benign) (HCC) 12/25/2021   meningioma, has Double vision, uses special glasses   Chronic kidney disease    Complication of anesthesia    post op  nausea and vomiting   Diabetes (HCC)    Type 2   History of echocardiogram 10/2012   Echo 4/14:  EF 60-65%, mild LAE, MAC, impaired relaxation   History of kidney stones    Hypertension    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 Glendia Gaudy 06-20-2021   PAF (paroxysmal atrial fibrillation) (HCC)    PONV (postoperative nausea and vomiting)    Vision changes 12/2021   double vision    ROS- All systems are reviewed and negative except as per the HPI above.  Physical Exam: Vitals:   02/01/24 1431  BP: 138/80  Pulse: (!) 59  Weight: 101.9 kg  Height: 5' (1.524 m)     GEN: Well nourished, well developed in no acute distress CARDIAC: Regular rate and rhythm, no murmurs, rubs, gallops RESPIRATORY:  Clear to auscultation without rales, wheezing or rhonchi  ABDOMEN: Soft, non-tender, non-distended EXTREMITIES:  No edema; No deformity    Wt Readings from Last 3 Encounters:  02/01/24 101.9 kg  01/12/24 101.2 kg  01/05/24 102.3 kg    EKG today demonstrates  SB Vent. rate 59 BPM PR interval 178 ms QRS duration 90 ms QT/QTcB 416/411 ms   Echo 05/19/21 demonstrated  1. Left ventricular ejection fraction, by estimation, is 60 to 65%. Left  ventricular ejection fraction by 3D volume is 62 %. The left ventricle has normal function. The left ventricle has no regional wall motion  abnormalities. Left ventricular diastolic  parameters are consistent with Grade I diastolic dysfunction (impaired relaxation).   2. Right ventricular systolic function is normal. The right ventricular  size is normal. There is normal pulmonary artery systolic pressure. The  estimated right ventricular systolic pressure is 19.8 mmHg.   3. Left atrial size was moderately dilated.   4. The mitral valve is abnormal. Trivial mitral valve regurgitation.  Moderate mitral annular calcification.   5. The aortic valve is  tricuspid. Aortic valve regurgitation is not  visualized.   6. The inferior vena cava is normal in size with greater than 50%  respiratory variability, suggesting right atrial pressure of 3 mmHg.   Epic records are reviewed at length today  CHA2DS2-VASc Score = 5  The patient's score is based upon: CHF History: 0 HTN History: 1 Diabetes History: 1 Stroke History: 0 Vascular Disease History: 0 Age Score: 2 Gender Score: 1       ASSESSMENT AND PLAN: Persistent Atrial Fibrillation (ICD10:  I48.19) The patient's CHA2DS2-VASc score is 5, indicating a 7.2% annual risk of stroke.   Amiodarone  discontinued due to bradycardia/junctional rhythm. S/p DCCV 01/12/24 Patient appears to be maintaining SR. If she has quick return of her afib, can consider dofetilide. If she continues her weight loss efforts, she could be a candidate for ablation in the future.  Continue Eliquis  5 mg BID Kardia mobile for home monitoring.   Secondary Hypercoagulable State (ICD10:  D68.69) The patient is at significant risk for stroke/thromboembolism based upon her CHA2DS2-VASc Score of 5.  Continue Apixaban  (Eliquis ). No bleeding issues.   Obesity Body mass index is 43.86 kg/m.  Encouraged lifestyle modification On Ozempic  HTN Stable on current regimen   Follow up with Dr Lonni as scheduled. AF clinic in 6 months.     [name] PA-C Afib Clinic [facility] [address] [place], [place] [address] [phone] 02/01/2024 2:41 PM

## 2024-02-24 ENCOUNTER — Encounter (HOSPITAL_BASED_OUTPATIENT_CLINIC_OR_DEPARTMENT_OTHER): Payer: Self-pay

## 2024-02-25 ENCOUNTER — Encounter (HOSPITAL_BASED_OUTPATIENT_CLINIC_OR_DEPARTMENT_OTHER): Payer: Self-pay | Admitting: Cardiology

## 2024-02-25 ENCOUNTER — Ambulatory Visit (INDEPENDENT_AMBULATORY_CARE_PROVIDER_SITE_OTHER): Admitting: Cardiology

## 2024-02-25 VITALS — BP 128/68 | HR 67 | Resp 17 | Ht 60.0 in | Wt 220.0 lb

## 2024-02-25 DIAGNOSIS — I5032 Chronic diastolic (congestive) heart failure: Secondary | ICD-10-CM | POA: Diagnosis not present

## 2024-02-25 DIAGNOSIS — I10 Essential (primary) hypertension: Secondary | ICD-10-CM

## 2024-02-25 DIAGNOSIS — I480 Paroxysmal atrial fibrillation: Secondary | ICD-10-CM | POA: Diagnosis not present

## 2024-02-25 DIAGNOSIS — E119 Type 2 diabetes mellitus without complications: Secondary | ICD-10-CM

## 2024-02-25 DIAGNOSIS — D6869 Other thrombophilia: Secondary | ICD-10-CM

## 2024-02-25 DIAGNOSIS — Z7901 Long term (current) use of anticoagulants: Secondary | ICD-10-CM | POA: Diagnosis not present

## 2024-02-25 DIAGNOSIS — E66813 Obesity, class 3: Secondary | ICD-10-CM

## 2024-02-25 DIAGNOSIS — Z6841 Body Mass Index (BMI) 40.0 and over, adult: Secondary | ICD-10-CM

## 2024-02-25 DIAGNOSIS — Z794 Long term (current) use of insulin: Secondary | ICD-10-CM

## 2024-02-25 NOTE — Patient Instructions (Addendum)
 Medication Instructions:  Your physician recommends that you continue on your current medications as directed. Please refer to the Current Medication list given to you today.   Labwork: NONE  Testing/Procedures: NONE  Follow-Up: 1 YEAR WITH DR CHRISTOPHER, CAITLIN W NP, OR MICHELLE S NP   Any Other Special Instructions Will Be Listed Below (If Applicable).  Talk to Dr. Loreli about whether Bernadine or Doreen would be a good option for you, as there is diabetes, heart, and kidney benefit to it. May need to have insulin adjusted if these are started. Also need to watch for UTIs/yeast infection

## 2024-02-25 NOTE — Progress Notes (Signed)
 Cardiology Office Note:  .   Date:  02/25/2024  ID:  Carrie Newton, DOB January 04, 1946, MRN [id number] PCP: Loreli Kins, MD  [facility] Providers Cardiologist:  Shelda Bruckner, MD Electrophysiologist:  Eulas FORBES Furbish, MD {  History of Present Illness: .   Carrie Newton is a 78 y.o. female with PMH atrial fibrillation, chronic diastolic heart failure, obesity, hypertension. She was previously followed by Dr. Dann and Dr. Alveta and established care with me on 02/25/24. She follows with the afib clinic and Dr. Furbish as well.  Today: She checks her rhythm on Kardia mobile, has been in sinus since her cardioversion. Thinks last episode of afib was triggered after 4 different cortisol joint injections close together in the spring. We discussed management options, see below. Tolerates apixaban  without significant major bleeding.  ROS: Denies chest pain, shortness of breath at rest or with normal exertion. No PND, orthopnea, LE edema or unexpected weight gain. No syncope or palpitations. ROS otherwise negative except as noted.   Studies Reviewed: SABRA    EKG:       Physical Exam:   VS:  BP 128/68 (BP Location: Left Arm, Patient Position: Sitting, Cuff Size: Large)   Pulse 67   Resp 17   Ht 5' (1.524 m)   Wt 220 lb (99.8 kg)   SpO2 98%   BMI 42.97 kg/m    Wt Readings from Last 3 Encounters:  02/25/24 220 lb (99.8 kg)  02/01/24 224 lb 9.6 oz (101.9 kg)  01/12/24 223 lb (101.2 kg)    GEN: Well nourished, well developed in no acute distress HEENT: Normal, moist mucous membranes NECK: No JVD CARDIAC: regular rhythm, normal S1 and S2, no rubs or gallops. No murmur. VASCULAR: Radial and DP pulses 2+ bilaterally. No carotid bruits RESPIRATORY:  Clear to auscultation without rales, wheezing or rhonchi  ABDOMEN: Soft, non-tender, non-distended MUSCULOSKELETAL:  Ambulates independently SKIN: Warm and dry, no edema NEUROLOGIC:  Alert and oriented x 3. No focal neuro  deficits noted. PSYCHIATRIC:  Normal affect    ASSESSMENT AND PLAN: .    Paroxysmal atrial fibrillation Secondary hypercoagulable state -CHA2DS2/VAS Stroke Risk Points=6  -she has been on amiodarone  in the past, stopped 2/2 junctional rhythm.  -most recent cardioversion 01/12/24 -if afib recurs, plan is for tikosyn vs. Ablation if she is able to lose weight. She would like to avoid tikosyn if at all possible -continue apixaban  -has PRN diltiazem  -echo unremarkable 2022, EF 60-65%, RV normal, no significant valve disease, G1DD  Chronic diastolic heart failure Obesity Type II diabetes -appears euvolemic -echo 2022 as above, G1DD -BMI ~43 -on Ozempic 2 mg/week dose and insulin -discussed SGLT2i, does not appear she has been on this before. We discussed today. Doesn't have significant UTIs. She will discuss with Dr. Loreli, may need insulin adjusted if these are started -on atorvastatin. No aspirin as she is on apixaban  -lipids per [facility] from 11/16/23: Tchol 191, HDL 81, LDL 94, TG 88  Hypertension -well controlled, continue losartan 25 mg daily  CV risk counseling and prevention -recommend heart healthy/Mediterranean diet, with whole grains, fruits, vegetable, fish, lean meats, nuts, and olive oil. Limit salt. -recommend moderate walking, 3-5 times/week for 30-50 minutes each session. Aim for at least 150 minutes/week. Goal should be pace of 3 miles/hours, or walking 1.5 miles in 30 minutes -recommend avoidance of tobacco products. Avoid excess alcohol.  Dispo: 1 year or sooner as needed  Signed, Shelda Bruckner, MD   Shelda Bruckner, MD,  PhD, [facility]  [facility]  [place]  [facility] at [facility] at [facility] [address] [place], [place] [address] [phone]

## 2024-03-19 ENCOUNTER — Other Ambulatory Visit: Payer: Self-pay | Admitting: Cardiovascular Disease

## 2024-03-19 DIAGNOSIS — I480 Paroxysmal atrial fibrillation: Secondary | ICD-10-CM

## 2024-03-20 NOTE — Telephone Encounter (Signed)
 Eliquis  5mg  refill request received. Patient is 78 years old, weight-99.8kg, Crea-0.78 on 01/05/24, Diagnosis-Afib, and last seen by Dr. Lonni on 02/25/24. Dose is appropriate based on dosing criteria. Will send in refill to requested pharmacy.

## 2024-05-24 ENCOUNTER — Telehealth (HOSPITAL_BASED_OUTPATIENT_CLINIC_OR_DEPARTMENT_OTHER): Payer: Self-pay | Admitting: *Deleted

## 2024-05-24 NOTE — Telephone Encounter (Signed)
   Pre-operative Risk Assessment    Patient Name: Carrie Newton  DOB: Dec 23, 1945 MRN: [id number]   Date of last office visit: 02/25/24 DR. BRIDGETTE CHRISTOPHER Date of next office visit: 07/31/24 [facility]   Request for Surgical Clearance    Procedure:  COLONOSCOPY/ENDOSCOPY (CHRONIC GERD, ABDOMINAL PAIN)  Date of Surgery:  Clearance 07/19/24                                Surgeon:  DR. ESTEFANA KEAS Surgeon's Group or Practice Name:  [facility] Phone number:  [phone] Fax number:  [phone]   Type of Clearance Requested:   - Medical  - Pharmacy:  Hold Apixaban  (Eliquis )     Type of Anesthesia:  PROPOFOL     Additional requests/questions:    Bonney Niels Jest   05/24/2024, 5:00 PM

## 2024-05-31 NOTE — Telephone Encounter (Signed)
   Name: Carrie Newton  DOB: [birth year]  MRN: [id number]  Primary Cardiologist: Shelda Bruckner, MD  Preoperative team, please contact this patient and set up a phone call appointment for further preoperative risk assessment. Please obtain consent and complete medication review. Thank you for your help.  I confirm that guidance regarding antiplatelet and oral anticoagulation therapy has been completed and, if necessary, noted below.  Per office protocol, patient can hold Eliquis  for 2 days prior to procedure.   Patient will not need bridging with Lovenox (enoxaparin) around procedure.  I also confirmed the patient resides in the state of [place] . As per [facility] Medical Board telemedicine laws, the patient must reside in the state in which the provider is licensed.  Arling Cerone D Ranger Petrich, NP 05/31/2024, 4:27 PM [facility]

## 2024-05-31 NOTE — Telephone Encounter (Signed)
 Letf message for patient to call our office and ask for the preop team to schedule TELE Preop appt .

## 2024-05-31 NOTE — Telephone Encounter (Signed)
 Patient with diagnosis of atrial fibrillation on Eliquis  for anticoagulation.    Procedure:  COLONOSCOPY/ENDOSCOPY (CHRONIC GERD, ABDOMINAL PAIN)   Date of Surgery:  Clearance 07/19/24      CHA2DS2-VASc Score = 6   This indicates a 9.7% annual risk of stroke. The patient's score is based upon: CHF History: 1 HTN History: 1 Diabetes History: 1 Stroke History: 0 Vascular Disease History: 0 Age Score: 2 Gender Score: 1    CrCl 95 Platelet count 253  Patient has not had an Afib/aflutter ablation in the last 3 months, DCCV within the last 4 weeks or a watchman implanted in the last 45 days   Per office protocol, patient can hold Eliquis  for 2 days prior to procedure.   Patient will not need bridging with Lovenox (enoxaparin) around procedure.  **This guidance is not considered finalized until pre-operative APP has relayed final recommendations.**

## 2024-06-05 ENCOUNTER — Telehealth (HOSPITAL_BASED_OUTPATIENT_CLINIC_OR_DEPARTMENT_OTHER): Payer: Self-pay | Admitting: *Deleted

## 2024-06-05 NOTE — Telephone Encounter (Signed)
 Pt has been scheduled tele preop appt 07/04/24. Med rec and consent are done.     Patient Consent for Virtual Visit        Carrie Newton has provided verbal consent on 06/05/2024 for a virtual visit (video or telephone).   CONSENT FOR VIRTUAL VISIT FOR:  Carrie Newton  By participating in this virtual visit I agree to the following:  I hereby voluntarily request, consent and authorize [facility] and its employed or contracted physicians, physician assistants, nurse practitioners or other licensed health care professionals (the Practitioner), to provide me with telemedicine health care services (the "Services) as deemed necessary by the treating Practitioner. I acknowledge and consent to receive the Services by the Practitioner via telemedicine. I understand that the telemedicine visit will involve communicating with the Practitioner through live audiovisual communication technology and the disclosure of certain medical information by electronic transmission. I acknowledge that I have been given the opportunity to request an in-person assessment or other available alternative prior to the telemedicine visit and am voluntarily participating in the telemedicine visit.  I understand that I have the right to withhold or withdraw my consent to the use of telemedicine in the course of my care at any time, without affecting my right to future care or treatment, and that the Practitioner or I may terminate the telemedicine visit at any time. I understand that I have the right to inspect all information obtained and/or recorded in the course of the telemedicine visit and may receive copies of available information for a reasonable fee.  I understand that some of the potential risks of receiving the Services via telemedicine include:  Delay or interruption in medical evaluation due to technological equipment failure or disruption; Information transmitted may not be sufficient (e.g. poor  resolution of images) to allow for appropriate medical decision making by the Practitioner; and/or  In rare instances, security protocols could fail, causing a breach of personal health information.  Furthermore, I acknowledge that it is my responsibility to provide information about my medical history, conditions and care that is complete and accurate to the best of my ability. I acknowledge that Practitioner's advice, recommendations, and/or decision may be based on factors not within their control, such as incomplete or inaccurate data provided by me or distortions of diagnostic images or specimens that may result from electronic transmissions. I understand that the practice of medicine is not an exact science and that Practitioner makes no warranties or guarantees regarding treatment outcomes. I acknowledge that a copy of this consent can be made available to me via my patient portal [facility] MyChart), or I can request a printed copy by calling the office of [facility].    I understand that my insurance will be billed for this visit.   I have read or had this consent read to me. I understand the contents of this consent, which adequately explains the benefits and risks of the Services being provided via telemedicine.  I have been provided ample opportunity to ask questions regarding this consent and the Services and have had my questions answered to my satisfaction. I give my informed consent for the services to be provided through the use of telemedicine in my medical care

## 2024-06-05 NOTE — Telephone Encounter (Signed)
 Pt has been scheduled tele preop appt 07/04/24. Med rec and consent are done.

## 2024-07-04 ENCOUNTER — Ambulatory Visit: Attending: Cardiology | Admitting: Physician Assistant

## 2024-07-04 DIAGNOSIS — Z01810 Encounter for preprocedural cardiovascular examination: Secondary | ICD-10-CM

## 2024-07-04 NOTE — Progress Notes (Signed)
 "   Virtual Visit via Telephone Note   Because of Carrie Newton co-morbid illnesses, she is at least at moderate risk for complications without adequate follow up.  This format is felt to be most appropriate for this patient at this time.  Due to technical limitations with video connection (technology), today's appointment will be conducted as an audio only telehealth visit, and Carrie Newton verbally agreed to proceed in this manner.   All issues noted in this document were discussed and addressed.  No physical exam could be performed with this format.  Evaluation Performed:  Preoperative cardiovascular risk assessment _____________   Date:  07/04/2024   Patient ID:  Carrie Newton, DOB 09-22-1945, MRN [id number] Patient Location:  Home Provider location:   Office  Primary Care Provider:  Loreli Kins, MD Primary Cardiologist:  Shelda Bruckner, MD  Chief Complaint / Patient Profile   78 y.o. y/o female with a h/o Afib, DM type 2, HTN who is pending colonoscopy/endoscopy and presents today for telephonic preoperative cardiovascular risk assessment.  History of Present Illness    Carrie Newton is a 78 y.o. female who presents via audio/video conferencing for a telehealth visit today.  Pt was last seen in cardiology clinic on 02/25/24 by Dr. Bruckner.  At that time Carrie Newton was doing well s/p DCCV July 9th.  The patient is now pending procedure as outlined above. Since her last visit, she has follow-up with [name] coming up. She has not felt any Afib since her last follow-up. She does Kardia mobile at times and its NSR. 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 She has been through 2 rounds of PT for her knees so she can strengthen her legs so she can stand up from a chair without assistance.   Per office protocol, patient can hold Eliquis  for 2 days prior to procedure.  She can resume when medically safe to do so.  Patient will not  need bridging with Lovenox (enoxaparin) around procedure.  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 Glendia Gaudy 06-20-2021   PAF (paroxysmal atrial fibrillation) (HCC)    PONV (postoperative nausea and vomiting)    Vision changes 12/2021   double vision   Past Surgical History:  Procedure Laterality Date   BIOPSY  04/05/2019   Procedure: BIOPSY;  Surgeon: Lennard Lesta FALCON, MD;  Location: [facility] ENDOSCOPY;  Service: Endoscopy;;   BREAST LUMPECTOMY  07/06/1990   Left breast   CARDIOVERSION N/A 01/12/2024   Procedure: CARDIOVERSION;  Surgeon: Sheena Pugh, DO;  Location: [facility] INVASIVE CV LAB;  Service: Cardiovascular;  Laterality: N/A;   CARPAL TUNNEL RELEASE     Left hand 05/2012 and right hand dec 2013   COLONOSCOPY WITH PROPOFOL  N/A 04/05/2019   Procedure: COLONOSCOPY WITH PROPOFOL ;  Surgeon: Lennard,  Lesta FALCON, MD;  Location: [facility] ENDOSCOPY;  Service: Endoscopy;  Laterality: N/A;   CYSTOSCOPY W/ URETERAL STENT REMOVAL Left 07/29/2022   Procedure: CYSTOSCOPY WITH STENT REMOVAL;  Surgeon: Alvaro Hummer, MD;  Location: [facility];  Service: Urology;  Laterality: Left;   CYSTOSCOPY WITH RETROGRADE PYELOGRAM, URETEROSCOPY AND STENT PLACEMENT Left 06/03/2022   Procedure: CYSTOSCOPY WITH RETROGRADE PYELOGRAM, URETEROSCOPY AND STENT PLACEMENT FIRST STAGE;  Surgeon: Alvaro Hummer, MD;  Location: [facility];  Service: Urology;  Laterality: Left;  90 MINS    CYSTOSCOPY WITH RETROGRADE PYELOGRAM, URETEROSCOPY AND STENT PLACEMENT Left 06/24/2022   Procedure: SECOND STAGE CYSTOSCOPY WITH RETROGRADE PYELOGRAM, URETEROSCOPY AND STENT EXCHANGE AND LASER;  Surgeon: Alvaro Hummer, MD;  Location: [facility];  Service: Urology;  Laterality: Left;  75 MINS   ESOPHAGOGASTRODUODENOSCOPY (EGD) WITH PROPOFOL  N/A 04/05/2019   Procedure: ESOPHAGOGASTRODUODENOSCOPY (EGD) WITH PROPOFOL ;  Surgeon: Lennard Lesta FALCON, MD;  Location: [facility] ENDOSCOPY;  Service: Endoscopy;  Laterality: N/A;   HOLMIUM LASER APPLICATION Left 06/03/2022   Procedure: HOLMIUM LASER APPLICATION;  Surgeon: Alvaro Hummer, MD;  Location: [facility];  Service: Urology;  Laterality: Left;   HOLMIUM LASER APPLICATION Left 06/24/2022   Procedure: HOLMIUM LASER APPLICATION;  Surgeon: Alvaro Hummer, MD;  Location: [facility];  Service: Urology;  Laterality: Left;   KNEE ARTHROSCOPY Left 2011   TRIGGER FINGER RELEASE  07/06/2013   Left thumb    Allergies  Allergies[1]  Home Medications    Prior to Admission medications  Medication Sig Start Date End Date Taking? Authorizing Provider  ACCU-CHEK AVIVA PLUS test strip 1 each by Other route as needed for other (SUGAR CHECK).  08/07/13   [provider]  alendronate (FOSAMAX) 70 MG tablet Take 70 mg by mouth once a week. 02/28/21   [provider]  Ascorbic Acid (VITAMIN C) 1000 MG tablet Take 1,000 mg by mouth daily. Chewable    [provider]  atorvastatin (LIPITOR) 20 MG tablet Take 20 mg by mouth at bedtime. 08/31/13   [provider]  buPROPion (WELLBUTRIN XL) 300 MG 24 hr tablet Take 300 mg by mouth in the morning. 08/31/13   [provider]  calcium carbonate (SUPER CALCIUM) 1500 (600 Ca) MG TABS tablet Take 600 mg of elemental calcium by mouth daily at 6 (six) AM. Calcrate Patient not taking: Reported on 06/05/2024    [provider]  cyclobenzaprine (FLEXERIL) 10 MG tablet Take 10 mg by mouth 3 (three) times  daily as needed for muscle spasms. 07/15/22   [provider]  diclofenac Sodium (VOLTAREN) 1 % GEL Apply 2 g topically daily as needed (pain). 06/18/21   [provider]  diltiazem  (CARDIZEM ) 30 MG tablet Take 1 Tablet Every 4 Hours As Needed For HR >100  and TOP BP>100 12/31/23   Fenton, Clint R, PA  ELIQUIS  5 MG TABS tablet TAKE 1 TABLET BY MOUTH TWICE  DAILY 03/20/24   Mealor, Augustus E, MD  felodipine (PLENDIL) 10 MG 24 hr tablet Take 10 mg by mouth at bedtime.    [provider]  Ferrous Sulfate (IRON PO) Take 28 mg by mouth at bedtime.    [provider]  HYDROcodone -acetaminophen  (NORCO) 10-325 MG tablet Take 0.5-1 tablets by mouth every 6 (six) hours as needed for severe pain. Post-operatively 07/29/22   Alvaro Hummer KATHEE Mickey., MD  ipratropium (ATROVENT) 0.06 % nasal spray Place 2 sprays into the nose daily as needed for rhinitis. 10/27/23   [provider]  JARDIANCE  25 MG TABS tablet Take 25 mg by mouth daily. 05/10/24   [provider]  levothyroxine (SYNTHROID) 88 MCG tablet Take 88 mcg by mouth every other day. Alternate with 75 mcg before breakfast 02/06/19   [provider]  levothyroxine (SYNTHROID, LEVOTHROID) 75 MCG tablet Take 75 mcg by mouth every other day. Alternate with 88 mcg tab 08/31/13   [provider]  losartan (COZAAR) 25 MG tablet Take 25 mg by mouth at bedtime. 08/03/15   [provider]  Multiple Vitamin (MULTIVITAMIN) tablet Take 1 tablet by mouth daily. Centrum Woman 50+    [provider]  mupirocin ointment (BACTROBAN) 2 % Apply 1 application  topically daily as needed (wound care). 07/05/15   [provider]  omeprazole (PRILOSEC) 40 MG capsule Take 40 mg by mouth 2 (two) times daily. 09/01/23   [provider]  Semaglutide, 2 MG/DOSE, (OZEMPIC, 2 MG/DOSE,) 8 MG/3ML SOPN Inject 2 mg into the skin once a week.    [provider]  sertraline (ZOLOFT) 100 MG  tablet Take 100 mg by mouth at bedtime.    [provider]  SODIUM FLUORIDE 5000 PPM 1.1 % PSTE 1 application  by Mouth Rinse route at bedtime. 06/19/23   [provider]  TRESIBA FLEXTOUCH 200 UNIT/ML FlexTouch Pen Inject into the skin. 01/18/24   [provider]    Physical Exam    Vital Signs:  Carrie Newton does not have vital signs available for review today.  Given telephonic nature of communication, physical exam is limited. AAOx3. NAD. Normal affect.  Speech and respirations are unlabored.  Accessory Clinical Findings    None  Assessment & Plan    1.  Preoperative Cardiovascular Risk Assessment:  Carrie Newton perioperative risk of a major cardiac event is 0.4% according to the Revised Cardiac Risk Index (RCRI).  Therefore, she is at low risk for perioperative complications.   Her functional capacity is fair at 4.4 METs according to the Duke Activity Status Index (DASI). Recommendations: According to ACC/AHA guidelines, no further cardiovascular testing needed.  The patient may proceed to surgery at acceptable risk.   Antiplatelet and/or Anticoagulation Recommendations:  Eliquis  (Apixaban ) can be held for 2 days prior to surgery.  Please resume post op when felt to be safe.    The patient was advised that if she develops new symptoms prior to surgery to contact our office to arrange for a follow-up visit, and she verbalized understanding.  A copy of this note will be routed to requesting surgeon.  Time:   Today, I have spent 12 minutes with the patient with telehealth technology discussing medical history, symptoms, and management plan.     Orren LOISE Fabry, PA-C  07/04/2024, 10:01 AM     [1]  Allergies Allergen Reactions   Amiodarone  Other (See Comments)    Did not tolerate due to side effects/bradycardia   Bacitracin-Polymyxin B Rash   Metoprolol Tartrate Other (See Comments)    Junctional Bradycardia    Neosporin [Neomycin-Bacitracin  Zn-Polymyx] Rash   Norvasc [Amlodipine Besylate] Rash   Percodan [Oxycodone-Aspirin] Other (See Comments)    Tingling    "

## 2024-07-31 ENCOUNTER — Ambulatory Visit (HOSPITAL_COMMUNITY): Admitting: Physician Assistant

## 2024-08-10 ENCOUNTER — Ambulatory Visit (HOSPITAL_COMMUNITY): Admitting: Physician Assistant

## 2024-08-21 ENCOUNTER — Ambulatory Visit (HOSPITAL_COMMUNITY): Admitting: Physician Assistant
